# Patient Record
Sex: Male | Born: 1945
Health system: Southern US, Community
[De-identification: ages and names within clinical notes are randomized; demographics above are authoritative.]

## PROBLEM LIST (undated history)

## (undated) DIAGNOSIS — N400 Enlarged prostate without lower urinary tract symptoms: Secondary | ICD-10-CM

## (undated) DIAGNOSIS — M1990 Unspecified osteoarthritis, unspecified site: Secondary | ICD-10-CM

## (undated) DIAGNOSIS — I10 Essential (primary) hypertension: Secondary | ICD-10-CM

## (undated) DIAGNOSIS — E785 Hyperlipidemia, unspecified: Secondary | ICD-10-CM

## (undated) DIAGNOSIS — I4891 Unspecified atrial fibrillation: Secondary | ICD-10-CM

## (undated) DIAGNOSIS — M109 Gout, unspecified: Secondary | ICD-10-CM

## (undated) DIAGNOSIS — R569 Unspecified convulsions: Secondary | ICD-10-CM

## (undated) DIAGNOSIS — T7840XA Allergy, unspecified, initial encounter: Secondary | ICD-10-CM

## (undated) HISTORY — DX: Unspecified osteoarthritis, unspecified site: M19.90

## (undated) HISTORY — DX: Unspecified atrial fibrillation: I48.91

## (undated) HISTORY — DX: Gout, unspecified: M10.9

## (undated) HISTORY — DX: Allergy, unspecified, initial encounter: T78.40XA

## (undated) HISTORY — DX: Essential (primary) hypertension: I10

## (undated) HISTORY — DX: Hyperlipidemia, unspecified: E78.5

---

## 1997-05-13 ENCOUNTER — Ambulatory Visit (HOSPITAL_COMMUNITY): Admission: RE | Admit: 1997-05-13 | Discharge: 1997-05-13 | Payer: Self-pay | Admitting: Internal Medicine

## 1997-06-20 ENCOUNTER — Ambulatory Visit (HOSPITAL_COMMUNITY): Admission: RE | Admit: 1997-06-20 | Discharge: 1997-06-20 | Payer: Self-pay | Admitting: Internal Medicine

## 1997-10-15 ENCOUNTER — Ambulatory Visit (HOSPITAL_COMMUNITY): Admission: RE | Admit: 1997-10-15 | Discharge: 1997-10-15 | Payer: Self-pay | Admitting: Internal Medicine

## 1998-12-08 ENCOUNTER — Encounter: Admission: RE | Admit: 1998-12-08 | Discharge: 1998-12-08 | Payer: Self-pay | Admitting: Internal Medicine

## 1998-12-12 ENCOUNTER — Ambulatory Visit: Admission: RE | Admit: 1998-12-12 | Discharge: 1998-12-12 | Payer: Self-pay | Admitting: Internal Medicine

## 2000-04-06 ENCOUNTER — Ambulatory Visit (HOSPITAL_COMMUNITY): Admission: RE | Admit: 2000-04-06 | Discharge: 2000-04-06 | Payer: Self-pay | Admitting: Gastroenterology

## 2002-01-26 ENCOUNTER — Encounter (HOSPITAL_BASED_OUTPATIENT_CLINIC_OR_DEPARTMENT_OTHER): Payer: Self-pay | Admitting: General Surgery

## 2002-01-30 ENCOUNTER — Ambulatory Visit (HOSPITAL_COMMUNITY): Admission: RE | Admit: 2002-01-30 | Discharge: 2002-01-31 | Payer: Self-pay | Admitting: General Surgery

## 2004-12-09 ENCOUNTER — Encounter: Admission: RE | Admit: 2004-12-09 | Discharge: 2004-12-09 | Payer: Self-pay | Admitting: Internal Medicine

## 2005-05-19 ENCOUNTER — Encounter: Payer: Self-pay | Admitting: Internal Medicine

## 2005-08-17 ENCOUNTER — Ambulatory Visit (HOSPITAL_COMMUNITY): Admission: RE | Admit: 2005-08-17 | Discharge: 2005-08-17 | Payer: Self-pay | Admitting: Internal Medicine

## 2008-12-10 ENCOUNTER — Encounter: Admission: RE | Admit: 2008-12-10 | Discharge: 2008-12-10 | Payer: Self-pay | Admitting: Internal Medicine

## 2009-09-13 ENCOUNTER — Emergency Department (HOSPITAL_COMMUNITY): Admission: EM | Admit: 2009-09-13 | Discharge: 2009-09-13 | Payer: Self-pay | Admitting: Emergency Medicine

## 2009-10-21 ENCOUNTER — Emergency Department (HOSPITAL_COMMUNITY): Admission: EM | Admit: 2009-10-21 | Discharge: 2009-10-21 | Payer: Self-pay | Admitting: Emergency Medicine

## 2010-04-01 LAB — URINALYSIS, ROUTINE W REFLEX MICROSCOPIC
Bilirubin Urine: NEGATIVE
Ketones, ur: NEGATIVE mg/dL
Leukocytes, UA: NEGATIVE
Nitrite: NEGATIVE
Protein, ur: NEGATIVE mg/dL
Urobilinogen, UA: 0.2 mg/dL (ref 0.0–1.0)
pH: 5 (ref 5.0–8.0)

## 2010-04-01 LAB — URINE MICROSCOPIC-ADD ON

## 2010-04-02 LAB — RAPID URINE DRUG SCREEN, HOSP PERFORMED
Benzodiazepines: NOT DETECTED
Cocaine: NOT DETECTED
Opiates: NOT DETECTED

## 2010-04-02 LAB — URINALYSIS, ROUTINE W REFLEX MICROSCOPIC
Glucose, UA: NEGATIVE mg/dL
Hgb urine dipstick: NEGATIVE
Ketones, ur: NEGATIVE mg/dL
Leukocytes, UA: NEGATIVE
Protein, ur: 30 mg/dL — AB
pH: 5 (ref 5.0–8.0)

## 2010-04-02 LAB — DIFFERENTIAL
Eosinophils Absolute: 0.1 10*3/uL (ref 0.0–0.7)
Eosinophils Relative: 1 % (ref 0–5)
Lymphs Abs: 3.1 10*3/uL (ref 0.7–4.0)
Monocytes Relative: 8 % (ref 3–12)

## 2010-04-02 LAB — ETHANOL: Alcohol, Ethyl (B): <5 mg/dL (ref 0–10)

## 2010-04-02 LAB — CBC
Hemoglobin: 13.6 g/dL (ref 13.0–17.0)
MCH: 26.6 pg (ref 26.0–34.0)
MCV: 79.8 fL (ref 78.0–100.0)
RBC: 5.11 MIL/uL (ref 4.22–5.81)
RDW: 14.9 % (ref 11.5–15.5)

## 2010-04-02 LAB — BASIC METABOLIC PANEL
Chloride: 111 mEq/L (ref 96–112)
GFR calc non Af Amer: >60 mL/min (ref >60)
Glucose, Bld: 169 mg/dL — ABNORMAL HIGH (ref 70–99)
Sodium: 143 mEq/L (ref 135–145)

## 2010-04-02 LAB — GLUCOSE, CAPILLARY

## 2010-04-02 LAB — HEPATIC FUNCTION PANEL
ALT: 17 U/L (ref 0–53)
Alkaline Phosphatase: 58 U/L (ref 39–117)
Total Protein: 7.2 g/dL (ref 6.0–8.3)

## 2010-04-17 ENCOUNTER — Ambulatory Visit
Admission: RE | Admit: 2010-04-17 | Discharge: 2010-04-17 | Disposition: A | Discharge status: Home / Self Care | Payer: 59 | Source: Ambulatory Visit | Attending: Internal Medicine | Admitting: Internal Medicine

## 2010-04-17 ENCOUNTER — Other Ambulatory Visit: Payer: Self-pay | Admitting: Internal Medicine

## 2010-04-17 DIAGNOSIS — M542 Cervicalgia: Secondary | ICD-10-CM

## 2010-05-12 ENCOUNTER — Encounter: Payer: Self-pay | Admitting: Internal Medicine

## 2010-06-05 NOTE — Op Note (Signed)
   NAME:  Joel Duarte, Joel Duarte                            ACCOUNT NO.:  [id number]   MEDICAL RECORD NO.:  [id number]                   PATIENT TYPE:  OIB   LOCATION:  2876                                 FACILITY:  [facility]   PHYSICIAN:  Leonie Man, M.D.                DATE OF BIRTH:  1945-12-22   DATE OF PROCEDURE:  01/30/2002  DATE OF DISCHARGE:                                 OPERATIVE REPORT   PREOPERATIVE DIAGNOSIS:  Posterior midline sphincteric anal fistula.   POSTOPERATIVE DIAGNOSIS:  Posterior midline sphincteric anal fistula.   PROCEDURE:  Anal fistulotomy.   SURGEON:  Leonie Man, M.D.   ASSISTANT:  Nurse.   ANESTHESIA:  General.   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 He comes to the operating  room after the risks and benefits of surgery have been fully discussed  including possibility of incontinence, leakage, bleeding, and infection.  He  understands these risks and comes to surgery now for marsupialization of his  anal fistula.   DESCRIPTION OF PROCEDURE:  Following the induction of satisfactory general  anesthesia, the patient was placed in the lithotomy position.  I attempted a  tubular proctosigmoidoscopy, but because of a poor prep, I could not advance  the scope further than about 7 cm.  The scope was withdrawn.  The perianal  tissues were prepped and draped to be included in a sterile operative field.  I introduced the anoscope through the fistula in the posterior midline and  probed which came out in the midline at about approximately the dentate  line.  The fistula was incised down upon, across the narrow portion of the  external sphincter and opened up in its entirety. The granulation tissue in  the fistula was curetted. Hemostasis was assured with electrocautery.  I  used a 2-0 chromic catgut suture to marsupialize the base of the fistula to  the mucosa and mucocutaneous  junction in a running interlocking fashion.  Hemostasis was noted to be intact.  Needle, sponge, and instrument count  correct.  A Xeroform gauze plug was placed within the resulting wound. A  sterile dressing was applied. The anesthetic reversed and the patient  removed from the operating room to the recovery room in stable condition. He  tolerated the procedure well.                                               Leonie Man, M.D.    PB/MEDQ  D:  01/30/2002  T:  01/30/2002  Job:  [id number]

## 2010-06-05 NOTE — Procedures (Signed)
[place]  Patient:    Joel Duarte, Joel Duarte                         MRN: [id number] Proc. Date: 04/06/00 Attending:  Florencia Reasons, M.D. CC:         Lind Guest. August Saucer, M.D.   Procedure Report  PROCEDURE PERFORMED:  Colonoscopy.  ENDOSCOPIST:  Florencia Reasons, M.D.  INDICATIONS FOR PROCEDURE:  The patient is a 65 year old with history of occasional blood on the toilet paper, heme positive stool, and history of some anemia which has apparently responded to iron supplementation.  His upper endoscopy was negative.  FINDINGS:  Moderate internal hemorrhoids but otherwise normal to terminal ileum.  DESCRIPTION OF PROCEDURE:  The nature, purpose and risks of the procedure had been discussed with the patient, who provided written consent.  Sedation for this procedure and the upper endoscopy which preceeded it totaled fentanyl 50 mcg and Versed 5 mg IV without arrhythmias or desaturation.  The Olympus adult video colonoscope was advanced to the terminal ileum, applying some external abdominal compression to get the tip of the scope to enter into the terminal ileum.  Pullback was then performed.  The quality of the prep was very good although there was a little bit of stool film proximally which had to be irrigated off.  The terminal ileum had some lymphoid hyperplasia but no evidence of Crohns ileitis or other abnormalities.  The colon itself was normal.  No colitis, polyps, cancer, vascular malformations or diverticulosis were appreciated. The retroflex view into the distal rectum was unremarkable.  Pullout through the anal canal demonstrated moderate internal hemorrhoids which appeared slightly excoriated.  No biopsies were obtained.  The patient tolerated the procedure well and there were no apparent complications.  IMPRESSION:  Internal hemorrhoids which might account for the patients blood on the toilet paper but no source of anemia  endoscopically evident.  PLAN:  I would recommend monitoring the patients hemoglobin following iron supplementation and consideration of follow-up Hemoccults at some later time when the patient is not having any hemorrhoidal irritation.  Persistent problems with anemia or unexplained heme positivity might be indication for evaluation of the small bowel. DD:  04/06/00 TD:  04/06/00 Job: [id number]

## 2010-06-05 NOTE — Procedures (Signed)
. [facility]  Patient:    Joel Duarte, Joel Duarte                         MRN: [id number] Proc. Date: 04/06/00 Attending:  Florencia Reasons, M.D. CC:         Lind Guest. August Saucer, M.D.   Procedure Report  PROCEDURE:  Upper endoscopy.  INDICATION:  A 65 year old with heme-positive stool and history of iron deficiency anemia.  FINDINGS:  Minimal hiatal hernia, otherwise normal.  DESCRIPTION OF PROCEDURE:  The nature, purpose, and risks of the procedure had been discussed with the patient, who provided written consent.  Sedation was fentanyl 50 mcg and Versed 5 mg IV without arrhythmias or desaturation.  The Olympus small-caliber adult video endoscope was passed under direct vision. The larynx and vocal cords looked normal.  The esophagus was very easily entered and had normal mucosa throughout its entire length, without evidence of Barretts esophagus, reflux esophagitis, varices, infection, or neoplasia. No ring or stricture was appreciated.  There was a 2 cm hiatal hernia.  The stomach was entered.  It contained no significant residual.  The gastric mucosa was normal, without evidence of gastritis, erosions, ulcers, polyps, or masses, and the retroflex view of the proximal stomach was unrevealing except for perhaps a small hiatal hernia.  The pylorus, duodenal bulb, and second duodenum were also normal in appearance.  The scope was removed from the patient.  No biopsies were obtained.  He tolerated the procedure well.  There were no apparent complications.  IMPRESSION:  Essentially normal upper endoscopy.  Questionable small hiatal hernia.  No source of anemia evident.  PLAN:  Proceed to colonoscopic evaluation. DD:  04/06/00 TD:  04/06/00 Job: [id number]

## 2011-06-07 ENCOUNTER — Emergency Department (HOSPITAL_COMMUNITY): Payer: 59

## 2011-06-07 ENCOUNTER — Encounter (HOSPITAL_COMMUNITY): Payer: Self-pay

## 2011-06-07 ENCOUNTER — Other Ambulatory Visit: Payer: Self-pay

## 2011-06-07 ENCOUNTER — Inpatient Hospital Stay (HOSPITAL_COMMUNITY)
Admission: EM | Admit: 2011-06-07 | Discharge: 2011-06-09 | DRG: 101 | Disposition: A | Discharge status: Home / Self Care | Payer: 59 | Source: Ambulatory Visit | Attending: Internal Medicine | Admitting: Internal Medicine

## 2011-06-07 DIAGNOSIS — Z79899 Other long term (current) drug therapy: Secondary | ICD-10-CM

## 2011-06-07 DIAGNOSIS — R7309 Other abnormal glucose: Secondary | ICD-10-CM | POA: Diagnosis present

## 2011-06-07 DIAGNOSIS — R4182 Altered mental status, unspecified: Secondary | ICD-10-CM

## 2011-06-07 DIAGNOSIS — R739 Hyperglycemia, unspecified: Secondary | ICD-10-CM | POA: Diagnosis present

## 2011-06-07 DIAGNOSIS — G40909 Epilepsy, unspecified, not intractable, without status epilepticus: Principal | ICD-10-CM | POA: Diagnosis present

## 2011-06-07 DIAGNOSIS — G40409 Other generalized epilepsy and epileptic syndromes, not intractable, without status epilepticus: Secondary | ICD-10-CM | POA: Diagnosis present

## 2011-06-07 DIAGNOSIS — R569 Unspecified convulsions: Secondary | ICD-10-CM | POA: Diagnosis present

## 2011-06-07 DIAGNOSIS — G4733 Obstructive sleep apnea (adult) (pediatric): Secondary | ICD-10-CM | POA: Diagnosis present

## 2011-06-07 DIAGNOSIS — I10 Essential (primary) hypertension: Secondary | ICD-10-CM

## 2011-06-07 DIAGNOSIS — G4730 Sleep apnea, unspecified: Secondary | ICD-10-CM

## 2011-06-07 DIAGNOSIS — G40309 Generalized idiopathic epilepsy and epileptic syndromes, not intractable, without status epilepticus: Secondary | ICD-10-CM

## 2011-06-07 DIAGNOSIS — G4710 Hypersomnia, unspecified: Secondary | ICD-10-CM

## 2011-06-07 HISTORY — DX: Benign prostatic hyperplasia without lower urinary tract symptoms: N40.0

## 2011-06-07 HISTORY — DX: Unspecified convulsions: R56.9

## 2011-06-07 LAB — DIFFERENTIAL
Eosinophils Absolute: 0.1 10*3/uL (ref 0.0–0.7)
Lymphocytes Relative: 53 % — ABNORMAL HIGH (ref 12–46)
Lymphs Abs: 5.8 10*3/uL — ABNORMAL HIGH (ref 0.7–4.0)
Neutro Abs: 4.1 10*3/uL (ref 1.7–7.7)
Neutrophils Relative %: 37 % — ABNORMAL LOW (ref 43–77)

## 2011-06-07 LAB — CBC
Platelets: 158 10*3/uL (ref 150–400)
RBC: 4.41 MIL/uL (ref 4.22–5.81)
WBC: 11 10*3/uL — ABNORMAL HIGH (ref 4.0–10.5)

## 2011-06-07 LAB — COMPREHENSIVE METABOLIC PANEL
ALT: 16 U/L (ref 0–53)
Alkaline Phosphatase: 59 U/L (ref 39–117)
CO2: 16 mEq/L — ABNORMAL LOW (ref 19–32)
GFR calc Af Amer: 74 mL/min — ABNORMAL LOW (ref >90)
Glucose, Bld: 208 mg/dL — ABNORMAL HIGH (ref 70–99)
Potassium: 3.3 mEq/L — ABNORMAL LOW (ref 3.5–5.1)
Sodium: 142 mEq/L (ref 135–145)
Total Protein: 7.1 g/dL (ref 6.0–8.3)

## 2011-06-07 LAB — RAPID URINE DRUG SCREEN, HOSP PERFORMED
Amphetamines: NOT DETECTED
Barbiturates: NOT DETECTED
Benzodiazepines: NOT DETECTED
Cocaine: NOT DETECTED
Tetrahydrocannabinol: NOT DETECTED

## 2011-06-07 LAB — URINALYSIS, ROUTINE W REFLEX MICROSCOPIC
Bilirubin Urine: NEGATIVE
Specific Gravity, Urine: 1.013 (ref 1.005–1.030)
Urobilinogen, UA: 0.2 mg/dL (ref 0.0–1.0)

## 2011-06-07 MED ORDER — ACETAMINOPHEN 325 MG PO TABS: 650.0000 mg | ORAL_TABLET | Freq: Four times a day (QID) | ORAL | Status: DC | PRN

## 2011-06-07 MED ORDER — SUCCINYLCHOLINE CHLORIDE 20 MG/ML IJ SOLN
INTRAMUSCULAR | Status: AC
  Filled 2011-06-07: qty 10

## 2011-06-07 MED ORDER — METOPROLOL SUCCINATE ER 100 MG PO TB24
100.0000 mg | ORAL_TABLET | Freq: Every day | ORAL | Status: DC
  Administered 2011-06-07 – 2011-06-09 (×3): 100 mg via ORAL
  Filled 2011-06-07 (×3): qty 1

## 2011-06-07 MED ORDER — DOXAZOSIN MESYLATE 8 MG PO TABS
8.0000 mg | ORAL_TABLET | Freq: Every day | ORAL | Status: DC
  Administered 2011-06-07 – 2011-06-08 (×2): 8 mg via ORAL
  Filled 2011-06-07 (×3): qty 1

## 2011-06-07 MED ORDER — MIDAZOLAM HCL 2 MG/2ML IJ SOLN
INTRAMUSCULAR | Status: AC
  Administered 2011-06-07: 2 mg via INTRAVENOUS
  Filled 2011-06-07: qty 6

## 2011-06-07 MED ORDER — ETOMIDATE 2 MG/ML IV SOLN
INTRAVENOUS | Status: AC
  Filled 2011-06-07: qty 20

## 2011-06-07 MED ORDER — SODIUM CHLORIDE 0.9 % IV SOLN
INTRAVENOUS | Status: DC
  Administered 2011-06-07 – 2011-06-08 (×2): via INTRAVENOUS

## 2011-06-07 MED ORDER — LORAZEPAM 2 MG/ML IJ SOLN: 1.0000 mg | Freq: Four times a day (QID) | INTRAMUSCULAR | Status: DC | PRN

## 2011-06-07 MED ORDER — LORATADINE 10 MG PO TABS
10.0000 mg | ORAL_TABLET | Freq: Every day | ORAL | Status: DC
  Administered 2011-06-08: 10 mg via ORAL
  Filled 2011-06-07 (×3): qty 1

## 2011-06-07 MED ORDER — BENAZEPRIL HCL 40 MG PO TABS
40.0000 mg | ORAL_TABLET | Freq: Every day | ORAL | Status: DC
  Administered 2011-06-07 – 2011-06-09 (×3): 40 mg via ORAL
  Filled 2011-06-07 (×3): qty 1

## 2011-06-07 MED ORDER — LORAZEPAM 2 MG/ML IJ SOLN
1.0000 mg | Freq: Once | INTRAMUSCULAR | Status: AC
  Administered 2011-06-07: 1 mg via INTRAVENOUS

## 2011-06-07 MED ORDER — AMLODIPINE BESYLATE 10 MG PO TABS
10.0000 mg | ORAL_TABLET | Freq: Every day | ORAL | Status: DC
  Administered 2011-06-07 – 2011-06-09 (×3): 10 mg via ORAL
  Filled 2011-06-07 (×3): qty 1

## 2011-06-07 MED ORDER — ACETAMINOPHEN 650 MG RE SUPP: 650.0000 mg | Freq: Four times a day (QID) | RECTAL | Status: DC | PRN

## 2011-06-07 MED ORDER — LIDOCAINE HCL (CARDIAC) 20 MG/ML IV SOLN
INTRAVENOUS | Status: AC
  Filled 2011-06-07: qty 5

## 2011-06-07 MED ORDER — MIDAZOLAM HCL 2 MG/2ML IJ SOLN
5.0000 mg | INTRAMUSCULAR | Status: DC | PRN
  Administered 2011-06-07: 2 mg via INTRAVENOUS

## 2011-06-07 MED ORDER — LORAZEPAM 2 MG/ML IJ SOLN
2.0000 mg | Freq: Once | INTRAMUSCULAR | Status: AC
  Administered 2011-06-07: 2 mg via INTRAVENOUS

## 2011-06-07 MED ORDER — NALOXONE HCL 1 MG/ML IJ SOLN
INTRAMUSCULAR | Status: AC
  Administered 2011-06-07: 09:00:00
  Filled 2011-06-07: qty 2

## 2011-06-07 MED ORDER — SODIUM CHLORIDE 0.9 % IV SOLN
Freq: Once | INTRAVENOUS | Status: AC
  Administered 2011-06-07: 07:00:00 via INTRAVENOUS

## 2011-06-07 MED ORDER — LORAZEPAM 2 MG/ML IJ SOLN
INTRAMUSCULAR | Status: AC
  Administered 2011-06-07: 2 mg via INTRAVENOUS
  Administered 2011-06-07: 1 mg via INTRAVENOUS
  Filled 2011-06-07: qty 2

## 2011-06-07 MED ORDER — SODIUM CHLORIDE 0.9 % IV SOLN
1000.0000 mg | INTRAVENOUS | Status: AC
  Administered 2011-06-07: 1000 mg via INTRAVENOUS
  Filled 2011-06-07: qty 10

## 2011-06-07 MED ORDER — ROCURONIUM BROMIDE 50 MG/5ML IV SOLN
INTRAVENOUS | Status: AC
  Filled 2011-06-07: qty 2

## 2011-06-07 NOTE — ED Notes (Signed)
CBG completed 174

## 2011-06-07 NOTE — H&P (Signed)
Hospital Admission Note Date: 06/07/2011  Patient name: Joel Duarte           Medical record number: [id number] Date of birth: 1945-07-07           Age: 66 y.o.   Gender: male    PCP:   DEAN, ERIC, MD, MD   Chief Complaint:  Tonic-clonic seizures  HPI: Joel Duarte is a 66 y.o. male specimen was of hypertension, OSA on CPAP. Patient brought to the hospital by EMS after his wife called 911 after he had tonic-clonic seizure at home. Patient was having no complaints when he went to the bed last night, this morning his wife mentioned that he had generalized body shakes and foam coming out of his mouth this morning so she took the CPAP mask of his face and he was still seizing. She called 911 and brought him to the hospital. In the hospital he did receive 4 mg of Ativan, and I do not know if he got any benzodiazepines during EMT transportation to the hospital. On interview the patient he was still lethargic, likely from the benzodiazepines. But he is oriented. Patient was also given Keppra in the ED. Patient will be admitted for further evaluation.   Past Medical History: Past Medical History  Diagnosis Date  . Hypertension   . Allergy   . Osteoarthrosis, unspecified whether generalized or localized, unspecified site   . Seizures     history of 1 a year ago  . Enlarged prostate    History reviewed. No pertinent past surgical history.  Medications: Prior to Admission medications   Medication Sig Start Date End Date Taking? Authorizing Provider  amLODipine (NORVASC) 10 MG tablet Take 10 mg by mouth daily.   Yes Historical Provider, MD  benazepril (LOTENSIN) 40 MG tablet Take 40 mg by mouth daily.   Yes Historical Provider, MD  Cholecalciferol (VITAMIN D PO) Take 2 capsules by mouth daily.   Yes Historical Provider, MD  doxazosin (CARDURA) 8 MG tablet Take 8 mg by mouth at bedtime.   Yes Historical Provider, MD  fexofenadine (ALLEGRA) 60 MG tablet Take 60 mg by mouth daily as needed. For  allergies.   Yes Historical Provider, MD  metoprolol succinate (TOPROL-XL) 100 MG 24 hr tablet Take 100 mg by mouth daily. Take with or immediately following a meal.   Yes Historical Provider, MD  naproxen (NAPROSYN) 500 MG tablet Take 500 mg by mouth daily as needed. For inflammation.   Yes Historical Provider, MD  tadalafil (CIALIS) 5 MG tablet Take 5 mg by mouth daily as needed. For prostate.   Yes Historical Provider, MD    Allergies:  No Known Allergies  Social History:  does not have a smoking history on file. He does not have any smokeless tobacco history on file. His alcohol and drug histories not on file.  Family History: No family history on file.  Review of Systems:  Constitutional: negative for anorexia, fevers and sweats Eyes: negative for irritation, redness and visual disturbance Ears, nose, mouth, throat, and face: negative for earaches, epistaxis, nasal congestion and sore throat Respiratory: negative for cough, dyspnea on exertion, sputum and wheezing Cardiovascular: negative for chest pain, dyspnea, lower extremity edema, orthopnea, palpitations and syncope Gastrointestinal: negative for abdominal pain, constipation, diarrhea, melena, nausea and vomiting Genitourinary:negative for dysuria, frequency and hematuria Hematologic/lymphatic: negative for bleeding, easy bruising and lymphadenopathy Musculoskeletal:negative for arthralgias, muscle weakness and stiff joints Neurological: negative for coordination problems, gait problems, headaches and weakness Endocrine: negative  for diabetic symptoms including polydipsia, polyuria and weight loss Allergic/Immunologic: negative for anaphylaxis, hay fever and urticaria  Physical Exam: BP 179/69  Pulse 126  Temp(Src) 98.5 F (36.9 C) (Oral)  Resp 24  SpO2 93% General appearance: alert, cooperative and no distress  Head: Normocephalic, without obvious abnormality, atraumatic  Eyes: conjunctivae/corneas clear. PERRL, EOM's  intact. Fundi benign.  Nose: Nares normal. Septum midline. Mucosa normal. No drainage or sinus tenderness.  Throat: lips, mucosa, and tongue normal; teeth and gums normal  Neck: Supple, no masses, no cervical lymphadenopathy, no JVD appreciated, no meningeal signs Resp: clear to auscultation bilaterally  Chest wall: no tenderness  Cardio: regular rate and rhythm, S1, S2 normal, no murmur, click, rub or gallop  GI: soft, non-tender; bowel sounds normal; no masses, no organomegaly  Extremities: extremities normal, atraumatic, no cyanosis or edema  Skin: Skin color, texture, turgor normal. No rashes or lesions  Neurologic: Alert and oriented X 3, normal strength and tone. Normal symmetric reflexes. Normal coordination and gait  Labs on Admission:   [facility] 06/07/11 0740  NA 142  K 3.3*  CL 104  CO2 16*  GLUCOSE 208*  BUN 18  CREATININE 1.18  CALCIUM 8.6  MG --  PHOS --    Basename 06/07/11 0740  AST 23  ALT 16  ALKPHOS 59  BILITOT 0.4  PROT 7.1  ALBUMIN 3.6   No results found for this basename: LIPASE:2,AMYLASE:2 in the last 72 hours  Basename 06/07/11 0740  WBC 11.0*  NEUTROABS 4.1  HGB 11.6*  HCT 35.6*  MCV 80.7  PLT 158   No results found for this basename: CKTOTAL:3,CKMB:3,CKMBINDEX:3,TROPONINI:3 in the last 72 hours No results found for this basename: TSH,T4TOTAL,FREET3,T3FREE,THYROIDAB in the last 72 hours No results found for this basename: VITAMINB12:2,FOLATE:2,FERRITIN:2,TIBC:2,IRON:2,RETICCTPCT:2 in the last 72 hours  Radiological Exams on Admission: Ct Head Wo Contrast  06/07/2011  *RADIOLOGY REPORT*  Clinical Data: Seizure.  Confusion.  CT HEAD WITHOUT CONTRAST  Technique:  Contiguous axial images were obtained from the base of the skull through the vertex without contrast.  Comparison: 09/13/2009  Findings: The brain has a normal appearance without evidence of malformation, atrophy, old or acute infarction, mass lesion, hemorrhage, hydrocephalus or  extra-axial collection. Atherosclerotic calcification effects major vessels at the base of the brain.  The calvarium is unremarkable.  Sinuses, middle ears and mastoids are clear.  IMPRESSION: No acute or focal finding.  Normal appearance of the brain. Atherosclerosis.  Original Report Authenticated By: Thomasenia Sales, M.D.     IMPRESSION: Present on Admission:  .Seizure .HTN (hypertension) .Tonic clonic convulsion .OSA (obstructive sleep apnea)  Assessment/Plan  Seizure Patient had witnessed tonic-clonic seizures this morning, after Ativan administration the seizures resolved. This is the second episode of seizure patient had. He had an episode of tonic-clonic seizure also in 2011 and at that time patient was sent home from the emergency department. Patient does have normal CT scan of the head, denies any recent head trauma, CVA or any new medications. His urine drug screen is negative. I will call neurology to evaluate and recommend medications if needed.  Hypertension Patient is on metoprolol, Cardura and amlodipine. We'll continue these medications and continued to followup.  Obstructive sleep apnea The patient on CPAP at home and that'll be continued.  Hyperglycemia It's been noticed that the patient blood sugar from the BNP is over 200, I'll check hemoglobin A1c.  Viyan Rosamond A 06/07/2011, 10:20 AM

## 2011-06-07 NOTE — ED Notes (Signed)
Per EMS, pt at home with witnessed seizure by wife.Pt has a hx of seizure 1 year ago. Pupils on arrival were pinpoint. 2 MG Narcan given. 18g to Right hand. Pt is currently awake and combative. No words spoken at this time. Right pupil now larger than left pupil

## 2011-06-07 NOTE — ED Notes (Signed)
Family at bedside. 

## 2011-06-07 NOTE — Progress Notes (Signed)
This visit was in response to a page from ED.  The pt was brought in possibly having seizures and is being combative with staff.  Pt's wife is in the conference room.  Pt's wife is calm.  I offered emotional support.  I acted as a Print production planner between family and Haematologist. Pt is being taken to CT.   I will ask the ED chaplain to check in with the family. Joel Duarte  [phone]  oncall pager

## 2011-06-07 NOTE — ED Provider Notes (Addendum)
History     CSN: [id number]  Arrival date & time 06/07/11  0721   First MD Initiated Contact with Patient 06/07/11 [phone]      Chief Complaint  Patient presents with  . Seizures    (Consider location/radiation/quality/duration/timing/severity/associated sxs/prior treatment) Patient is a 66 y.o. male presenting with seizures. The history is provided by the EMS personnel. The history is limited by the condition of the patient (post-ictal).  Seizures   He was brought in by EMS after having had a seizure at home which was witnessed by his wife. He was combative and poorly responsive in the ambulance. He was noted to have bitten his tongue but did not have any incontinence. He is reported to have had a history of a seizure in the past. He medications which were brought with the patient did not include any anticonvulsants.  Past Medical History  Diagnosis Date  . Hypertension   . Allergy   . Osteoarthrosis, unspecified whether generalized or localized, unspecified site   . Seizures     history of 1 a year ago  . Enlarged prostate     History reviewed. No pertinent past surgical history.  No family history on file.  History  Substance Use Topics  . Smoking status: Not on file  . Smokeless tobacco: Not on file  . Alcohol Use:       Review of Systems  Unable to perform ROS: Mental status change  Neurological: Positive for seizures.    Allergies  Review of patient's allergies indicates no known allergies.  Home Medications   Current Outpatient Rx  Name Route Sig Dispense Refill  . LOTREL PO Oral Take by mouth daily. Lotrel 10/40 mg one daily     . ASPIRIN 81 MG PO TABS Oral Take 81 mg by mouth daily.      . AZELASTINE HCL 137 MCG/SPRAY NA SOLN Nasal 1 spray by Nasal route as needed. Use in each nostril as directed     . DOXAZOSIN MESYLATE 8 MG PO TABS Oral Take 8 mg by mouth. Doxazosin 8 mg q.h.s.     . DUTASTERIDE 0.5 MG PO CAPS Oral Take 0.5 mg by mouth daily.      Marland Kitchen  FEXOFENADINE HCL 60 MG PO TABS Oral Take 60 mg by mouth as needed.      Marland Kitchen NAPROXEN PO Oral Take 500 mg by mouth daily.      Marland Kitchen SILDENAFIL CITRATE 100 MG PO TABS Oral Take 100 mg by mouth as needed.      Marland Kitchen SILODOSIN 8 MG PO CAPS Oral Take by mouth daily.      . TRIAMTERENE-HCTZ PO Oral Take 12.5 mg by mouth daily.        Pulse 126  Resp 24  SpO2 93%  Physical Exam  Nursing note and vitals reviewed.  66 year old male who is agitated and does not respond to verbal stimuli. Vital signs are significant for tachycardia with a rate of 126 and also tachypnea with respiratory rate of 24 and hypertension with blood pressure 170/100. Head is normocephalic and atraumatic. He does not cooperate well for to evaluate pupils and not every time I try and open his eyes he thrashes his head from side to side and I cannot do a good look at his pupils. There is a bite on his tongue in the midline near the tip. Neck is nontender. Back is nontender. Lungs are clear without rales, wheezes, or rhonchi. Heart has regular rate and rhythm without  murmur. Abdomen is soft and nontender without masses or hepatosplenomegaly. Extremities show no evidence of trauma full range of motion is present. Skin is warm and dry without rash. Neurologic: He is agitated and thrashing around in the bed in a pattern that is very suggestive of post ictal state. He does not respond to verbal stimuli but will respond by just general agitation to being touched in certain areas. There no gross cranial nerve deficits and he moves all extremities equally.  ED Course  Procedures (including critical care time)  Results for orders placed during the hospital encounter of 06/07/11  CBC      Component Value Range   WBC 11.0 (*) 4.0 - 10.5 (K/uL)   RBC 4.41  4.22 - 5.81 (MIL/uL)   Hemoglobin 11.6 (*) 13.0 - 17.0 (g/dL)   HCT 45.4 (*) 09.8 - 52.0 (%)   MCV 80.7  78.0 - 100.0 (fL)   MCH 26.3  26.0 - 34.0 (pg)   MCHC 32.6  30.0 - 36.0 (g/dL)   RDW 11.9   14.7 - 82.9 (%)   Platelets 158  150 - 400 (K/uL)  DIFFERENTIAL      Component Value Range   Neutrophils Relative 37 (*) 43 - 77 (%)   Neutro Abs 4.1  1.7 - 7.7 (K/uL)   Lymphocytes Relative 53 (*) 12 - 46 (%)   Lymphs Abs 5.8 (*) 0.7 - 4.0 (K/uL)   Monocytes Relative 9  3 - 12 (%)   Monocytes Absolute 1.0  0.1 - 1.0 (K/uL)   Eosinophils Relative 1  0 - 5 (%)   Eosinophils Absolute 0.1  0.0 - 0.7 (K/uL)   Basophils Relative 0  0 - 1 (%)   Basophils Absolute 0.0  0.0 - 0.1 (K/uL)  COMPREHENSIVE METABOLIC PANEL      Component Value Range   Sodium 142  135 - 145 (mEq/L)   Potassium 3.3 (*) 3.5 - 5.1 (mEq/L)   Chloride 104  96 - 112 (mEq/L)   CO2 16 (*) 19 - 32 (mEq/L)   Glucose, Bld 208 (*) 70 - 99 (mg/dL)   BUN 18  6 - 23 (mg/dL)   Creatinine, Ser 5.62  0.50 - 1.35 (mg/dL)   Calcium 8.6  8.4 - 13.0 (mg/dL)   Total Protein 7.1  6.0 - 8.3 (g/dL)   Albumin 3.6  3.5 - 5.2 (g/dL)   AST 23  0 - 37 (U/L)   ALT 16  0 - 53 (U/L)   Alkaline Phosphatase 59  39 - 117 (U/L)   Total Bilirubin 0.4  0.3 - 1.2 (mg/dL)   GFR calc non Af Amer 63 (*) >90 (mL/min)   GFR calc Af Amer 74 (*) >90 (mL/min)  ETHANOL      Component Value Range   Alcohol, Ethyl (B) <11  0 - 11 (mg/dL)  GLUCOSE, CAPILLARY      Component Value Range   Glucose-Capillary 174 (*) 70 - 99 (mg/dL)   Comment 1 Documented in Chart     Comment 2 Notify RN     Ct Head Wo Contrast  06/07/2011  *RADIOLOGY REPORT*  Clinical Data: Seizure.  Confusion.  CT HEAD WITHOUT CONTRAST  Technique:  Contiguous axial images were obtained from the base of the skull through the vertex without contrast.  Comparison: 09/13/2009  Findings: The brain has a normal appearance without evidence of malformation, atrophy, old or acute infarction, mass lesion, hemorrhage, hydrocephalus or extra-axial collection. Atherosclerotic calcification effects major vessels at  the base of the brain.  The calvarium is unremarkable.  Sinuses, middle ears and mastoids are  clear.  IMPRESSION: No acute or focal finding.  Normal appearance of the brain. Atherosclerosis.  Original Report Authenticated By: Thomasenia Sales, M.D.    Date: 06/07/2011  Rate: 83  Rhythm: normal sinus rhythm  QRS Axis: normal  Intervals: normal  ST/T Wave abnormalities: nonspecific T wave changes  Conduction Disutrbances:nonspecific intraventricular conduction delay  Narrative Interpretation: Nonspecific T wave flattening, nonspecific anterior ventricular conduction delay. When compared with ECG of 09/13/2009, improvement is seen in repolarization abnormality  Old EKG Reviewed: changes noted    1. Seizure   2. Altered mental state   3. Hyperglycemia       MDM  Apparent seizure with prolonged postictal state. His old records have been reviewed and he has a prior ED visit for her seizure in 2011. He is given Ativan to try and reduce his agitation level and seizure workup will be repeated since it has been several years since his last seizure and initial dose of IV Is given.  He required an additional 2 mg of Ativan but has not woken up. Agitation has decreased to the point that he could get a CT scan done which did not show any acute process. Loading dose of Keppra has been ordered. At this point, it is not clear whether he is having ongoing occult seizure activity with status epilepticus or just a prolonged postictal state. In either case, he will need to be admitted. Case is discussed with Dr. Arthor Captain of [facility] who agrees to admit the patient. Of note, metabolic acidosis is consistent with recent seizure activity.  CRITICAL CARE Performed by: Dione Booze   Total critical care time: 60 minutes  Critical care time was exclusive of separately billable procedures and treating other patients.  Critical care was necessary to treat or prevent imminent or life-threatening deterioration.  Critical care was time spent personally by me on the following activities: development  of treatment plan with patient and/or surrogate as well as nursing, discussions with consultants, evaluation of patient's response to treatment, examination of patient, obtaining history from patient or surrogate, ordering and performing treatments and interventions, ordering and review of laboratory studies, ordering and review of radiographic studies, pulse oximetry and re-evaluation of patient's condition.         Dione Booze, MD 06/07/11 1610  Dione Booze, MD 06/07/11 [phone]

## 2011-06-07 NOTE — Consult Note (Signed)
[facility] CONSULT NOTE     Reason for Consult: Second seizure of lifetime.  CC: Seizure.    HPI:    Joel Duarte is an 66 y.o. male with a history of HTN and OSA (on CPAP). He presented to the ED for assessment of the second seizure of his lifetime. The patient was most likely asleep at seizure onset, as his wife was awakened by his thrashing movements in bed today. The patient was wearing his CPAP at the time, which wife removed during the seizure. She describes the movements as alternating high-amplitude flailing of his arms with extended, stiff legs, neck hyperextended and eyes open with globes rolled upward without jerking of the eyes. The patient bit his tongue and was drooling/foaming at the mouth. The movements continued for 5-6 minutes followed by a sleep-like state with eyes open and sonorous breathing. The patient's mentation has gradually improved since then, both during transport and in the ED. In the ED the patient received 4 mg of Ativan to control agitation - patient was not seizing at that time. Patient given Keppra x 1 in the ED. Patient not on any seizure meds at home. Recently started taking Cialis about 10 days ago, otherwise no new medication changes.   He "saw a neurologist 2 years ago" and may have had an MRI at that time. His wife states that an EEG done previously was "normal".    Past Medical History  Diagnosis Date  . Hypertension   . Allergy   . Osteoarthrosis, unspecified whether generalized or localized, unspecified site   . Seizures     history of 1 a year ago  . Enlarged prostate     History reviewed. No pertinent past surgical history.  No family history on file.  Social History:  does not have a smoking history on file. He does not have any smokeless tobacco history on file. His alcohol and drug histories not on file.  No Known Allergies  Medications:    Prior to Admission:  (Not in a hospital  admission)  Review of Systems - No fever, chills, diarrhea, nausea, vomiting, abdominal pain, cough, rash or headache.   Blood pressure 147/110, pulse 65, temperature 98.5 F (36.9 C), temperature source Oral, resp. rate 22, SpO2 97.00%.   Neurologic Examination:   Mental Status: Drowsy, not oriented to year or day. Oriented to city. Repetition and naming intact. Speech slow but fluent with comprehension intact.  Cranial Nerves: II-Visual fields grossly intact to confrontation. III/IV/VI- Saccadic smooth pursuits. Pupils reactive bilaterally. VII- No facial droop.  VIII-grossly intact to conversation X- phonation intact XI- bilateral shoulder shrug intact XII-midline tongue extension Motor: 5/5 bilaterally with normal tone and bulk Sensory: Temperature and light touch intact bilaterally with no extinction.  Deep Tendon Reflexes: 3+ patellae bilaterally. Achilles 2+ bilaterally. Biceps and brachioradialis 2+ bilaterally.  Cerebellar: Normal finger-to-nose, except for action tremor.  Gait: Deferred.   General: /AT, nondiaphoretic, noncyanotic. HEENT: Positive for tongue bites.    No results found for this basename: cbc, bmp, coags, chol, tri, ldl, hga1c    Results for orders placed during the hospital encounter of 06/07/11 (from the past 48 hour(s))  CBC     Status: Abnormal   Collection Time   06/07/11  7:40 AM      Component Value Range Comment   WBC 11.0 (*) 4.0 - 10.5 (K/uL)  RBC 4.41  4.22 - 5.81 (MIL/uL)    Hemoglobin 11.6 (*) 13.0 - 17.0 (g/dL)    HCT 16.1 (*) 09.6 - 52.0 (%)    MCV 80.7  78.0 - 100.0 (fL)    MCH 26.3  26.0 - 34.0 (pg)    MCHC 32.6  30.0 - 36.0 (g/dL)    RDW 04.5  40.9 - 81.1 (%)    Platelets 158  150 - 400 (K/uL)   DIFFERENTIAL     Status: Abnormal   Collection Time   06/07/11  7:40 AM      Component Value Range Comment   Neutrophils Relative 37 (*) 43 - 77 (%)    Neutro Abs 4.1  1.7 - 7.7 (K/uL)    Lymphocytes Relative 53 (*) 12 - 46 (%)     Lymphs Abs 5.8 (*) 0.7 - 4.0 (K/uL)    Monocytes Relative 9  3 - 12 (%)    Monocytes Absolute 1.0  0.1 - 1.0 (K/uL)    Eosinophils Relative 1  0 - 5 (%)    Eosinophils Absolute 0.1  0.0 - 0.7 (K/uL)    Basophils Relative 0  0 - 1 (%)    Basophils Absolute 0.0  0.0 - 0.1 (K/uL)   COMPREHENSIVE METABOLIC PANEL     Status: Abnormal   Collection Time   06/07/11  7:40 AM      Component Value Range Comment   Sodium 142  135 - 145 (mEq/L)    Potassium 3.3 (*) 3.5 - 5.1 (mEq/L)    Chloride 104  96 - 112 (mEq/L)    CO2 16 (*) 19 - 32 (mEq/L)    Glucose, Bld 208 (*) 70 - 99 (mg/dL)    BUN 18  6 - 23 (mg/dL)    Creatinine, Ser 9.14  0.50 - 1.35 (mg/dL)    Calcium 8.6  8.4 - 10.5 (mg/dL)    Total Protein 7.1  6.0 - 8.3 (g/dL)    Albumin 3.6  3.5 - 5.2 (g/dL)    AST 23  0 - 37 (U/L) HEMOLYSIS AT THIS LEVEL MAY AFFECT RESULT   ALT 16  0 - 53 (U/L)    Alkaline Phosphatase 59  39 - 117 (U/L)    Total Bilirubin 0.4  0.3 - 1.2 (mg/dL)    GFR calc non Af Amer 63 (*) >90 (mL/min)    GFR calc Af Amer 74 (*) >90 (mL/min)   ETHANOL     Status: Normal   Collection Time   06/07/11  7:40 AM      Component Value Range Comment   Alcohol, Ethyl (B) <11  0 - 11 (mg/dL)   GLUCOSE, CAPILLARY     Status: Abnormal   Collection Time   06/07/11  8:18 AM      Component Value Range Comment   Glucose-Capillary 174 (*) 70 - 99 (mg/dL)    Comment 1 Documented in Chart      Comment 2 Notify RN     URINALYSIS, ROUTINE W REFLEX MICROSCOPIC     Status: Abnormal   Collection Time   06/07/11  8:56 AM      Component Value Range Comment   Color, Urine YELLOW  YELLOW     APPearance CLEAR  CLEAR     Specific Gravity, Urine 1.013  1.005 - 1.030     pH 5.0  5.0 - 8.0     Glucose, UA 100 (*) NEGATIVE (mg/dL)    Hgb urine dipstick TRACE (*)  NEGATIVE     Bilirubin Urine NEGATIVE  NEGATIVE     Ketones, ur NEGATIVE  NEGATIVE (mg/dL)    Protein, ur 30 (*) NEGATIVE (mg/dL)    Urobilinogen, UA 0.2  0.0 - 1.0 (mg/dL)    Nitrite  NEGATIVE  NEGATIVE     Leukocytes, UA NEGATIVE  NEGATIVE    URINE RAPID DRUG SCREEN (HOSP PERFORMED)     Status: Normal   Collection Time   06/07/11  8:56 AM      Component Value Range Comment   Opiates NONE DETECTED  NONE DETECTED     Cocaine NONE DETECTED  NONE DETECTED     Benzodiazepines NONE DETECTED  NONE DETECTED     Amphetamines NONE DETECTED  NONE DETECTED     Tetrahydrocannabinol NONE DETECTED  NONE DETECTED     Barbiturates NONE DETECTED  NONE DETECTED    URINE MICROSCOPIC-ADD ON     Status: Normal   Collection Time   06/07/11  8:56 AM      Component Value Range Comment   WBC, UA 0-2  <3 (WBC/hpf)    RBC / HPF 0-2  <3 (RBC/hpf)    Bacteria, UA RARE  RARE      Ct Head Wo Contrast  06/07/2011  *RADIOLOGY REPORT*  Clinical Data: Seizure.  Confusion.  CT HEAD WITHOUT CONTRAST  Technique:  Contiguous axial images were obtained from the base of the skull through the vertex without contrast.  Comparison: 09/13/2009  Findings: The brain has a normal appearance without evidence of malformation, atrophy, old or acute infarction, mass lesion, hemorrhage, hydrocephalus or extra-axial collection. Atherosclerotic calcification effects major vessels at the base of the brain.  The calvarium is unremarkable.  Sinuses, middle ears and mastoids are clear.  IMPRESSION: No acute or focal finding.  Normal appearance of the brain. Atherosclerosis.  Original Report Authenticated By: Thomasenia Sales, M.D.     Assessment/Plan:   Second unprovoked seizure of lifetime. Initiation of an anticonvulsant medication is indicated. Has been given Keppra x 1. Would use Depakote as an outpatient. Monitor platelets, ammonia, LFTs as outpatient while on Depakote and also obtain during this admission. Depakote ER should be started at 10 mg/kg per day, taken once per day. Counseled not to drive until seizure free for 6-12 months. Also not to swim, not to take baths, not to operate heavy machinery, work at heights or  perform any other activities that would put patient or others at risk should he have a seizure. Obtain EEG. Obtain MRI brain with and without contrast. Obtain Mg level.   Electronically signed: Dr. Caryl Pina   06/07/2011, 11:00 AM

## 2011-06-07 NOTE — ED Notes (Signed)
Neurologist at bedside. 

## 2011-06-07 NOTE — ED Notes (Signed)
Attempted to call report to [phone] but nurse will call me back

## 2011-06-07 NOTE — ED Notes (Signed)
Hospitalist at bedside 

## 2011-06-07 NOTE — ED Notes (Signed)
MD back at bedside to discuss plan of care with family.

## 2011-06-08 ENCOUNTER — Inpatient Hospital Stay (HOSPITAL_COMMUNITY): Payer: 59

## 2011-06-08 DIAGNOSIS — R569 Unspecified convulsions: Secondary | ICD-10-CM

## 2011-06-08 LAB — COMPREHENSIVE METABOLIC PANEL
Albumin: 3.1 g/dL — ABNORMAL LOW (ref 3.5–5.2)
BUN: 15 mg/dL (ref 6–23)
Calcium: 8.5 mg/dL (ref 8.4–10.5)
GFR calc Af Amer: 76 mL/min — ABNORMAL LOW (ref >90)
Glucose, Bld: 109 mg/dL — ABNORMAL HIGH (ref 70–99)
Total Protein: 6.1 g/dL (ref 6.0–8.3)

## 2011-06-08 LAB — CBC
HCT: 32.9 % — ABNORMAL LOW (ref 39.0–52.0)
Hemoglobin: 10.8 g/dL — ABNORMAL LOW (ref 13.0–17.0)
MCV: 79.7 fL (ref 78.0–100.0)
RBC: 4.13 MIL/uL — ABNORMAL LOW (ref 4.22–5.81)
WBC: 7.7 10*3/uL (ref 4.0–10.5)

## 2011-06-08 LAB — HEMOGLOBIN A1C
Hgb A1c MFr Bld: 6.3 % — ABNORMAL HIGH (ref <5.7)
Mean Plasma Glucose: 134 mg/dL — ABNORMAL HIGH (ref <117)

## 2011-06-08 MED ORDER — GADOBENATE DIMEGLUMINE 529 MG/ML IV SOLN
20.0000 mL | Freq: Once | INTRAVENOUS | Status: AC
  Administered 2011-06-08: 20 mL via INTRAVENOUS

## 2011-06-08 NOTE — Progress Notes (Signed)
Routine EEG completed.  

## 2011-06-08 NOTE — Progress Notes (Signed)
Subjective:  History as noted. Patient has little recall of events around his seizure. He had however recently restarted using Cialis which he was taking in the morning. He had been using doxazosin at bedtime. Patient had done this for approximately 10 days without event until his present presentation. He had a similar event 2 years ago which was possibly associated with the use of a similar medication. He has not had any problems up until the past week.   No Known Allergies Current Facility-Administered Medications  Medication Dose Route Frequency Provider Last Rate Last Dose  . 0.9 %  sodium chloride infusion   Intravenous Continuous Clydia Llano, MD 50 mL/hr at 06/08/11 2026    . acetaminophen (TYLENOL) tablet 650 mg  650 mg Oral Q6H PRN Clydia Llano, MD       Or  . acetaminophen (TYLENOL) suppository 650 mg  650 mg Rectal Q6H PRN Clydia Llano, MD      . amLODipine (NORVASC) tablet 10 mg  10 mg Oral Daily Clydia Llano, MD   10 mg at 06/08/11 1035  . benazepril (LOTENSIN) tablet 40 mg  40 mg Oral Daily Clydia Llano, MD   40 mg at 06/08/11 1035  . doxazosin (CARDURA) tablet 8 mg  8 mg Oral QHS Clydia Llano, MD   8 mg at 06/08/11 2122  . gadobenate dimeglumine (MULTIHANCE) injection 20 mL  20 mL Intravenous Once Medication Radiologist, MD   20 mL at 06/08/11 1526  . loratadine (CLARITIN) tablet 10 mg  10 mg Oral Daily Clydia Llano, MD   10 mg at 06/08/11 1035  . LORazepam (ATIVAN) injection 1 mg  1 mg Intravenous Q6H PRN Caroline More, NP      . metoprolol succinate (TOPROL-XL) 24 hr tablet 100 mg  100 mg Oral Daily Clydia Llano, MD   100 mg at 06/08/11 1035    Objective: Blood pressure 152/84, pulse 65, temperature 97.6 F (36.4 C), temperature source Oral, resp. rate 16, height 6' 2.02" (1.88 m), weight 225 lb (102.059 kg), SpO2 98.00%.  Well-developed well-nourished black male in no acute distress. HEENT: Extraocular muscles are intact without diplopia. No sinus tenderness. NECK: No  enlarged thyroid. No posterior cervical nodes. No carotid bruits. LUNGS: Clear to auscultation. No vocal fremitus. CV: Normal S1, S2 without S3. No ectopy. ABD: Soft nontender. MSK: Negative Homans. No edema. NEURO: Alert, oriented x3 cranial nerves were intact. Negative drift.  CT scan results noted and reviewed.  Lab results: Results for orders placed during the hospital encounter of 06/07/11 (from the past 48 hour(s))  CBC     Status: Abnormal   Collection Time   06/07/11  7:40 AM      Component Value Range Comment   WBC 11.0 (*) 4.0 - 10.5 (K/uL)    RBC 4.41  4.22 - 5.81 (MIL/uL)    Hemoglobin 11.6 (*) 13.0 - 17.0 (g/dL)    HCT 96.0 (*) 45.4 - 52.0 (%)    MCV 80.7  78.0 - 100.0 (fL)    MCH 26.3  26.0 - 34.0 (pg)    MCHC 32.6  30.0 - 36.0 (g/dL)    RDW 09.8  11.9 - 14.7 (%)    Platelets 158  150 - 400 (K/uL)   DIFFERENTIAL     Status: Abnormal   Collection Time   06/07/11  7:40 AM      Component Value Range Comment   Neutrophils Relative 37 (*) 43 - 77 (%)    Neutro Abs 4.1  1.7 - 7.7 (K/uL)    Lymphocytes Relative 53 (*) 12 - 46 (%)    Lymphs Abs 5.8 (*) 0.7 - 4.0 (K/uL)    Monocytes Relative 9  3 - 12 (%)    Monocytes Absolute 1.0  0.1 - 1.0 (K/uL)    Eosinophils Relative 1  0 - 5 (%)    Eosinophils Absolute 0.1  0.0 - 0.7 (K/uL)    Basophils Relative 0  0 - 1 (%)    Basophils Absolute 0.0  0.0 - 0.1 (K/uL)   COMPREHENSIVE METABOLIC PANEL     Status: Abnormal   Collection Time   06/07/11  7:40 AM      Component Value Range Comment   Sodium 142  135 - 145 (mEq/L)    Potassium 3.3 (*) 3.5 - 5.1 (mEq/L)    Chloride 104  96 - 112 (mEq/L)    CO2 16 (*) 19 - 32 (mEq/L)    Glucose, Bld 208 (*) 70 - 99 (mg/dL)    BUN 18  6 - 23 (mg/dL)    Creatinine, Ser 4.54  0.50 - 1.35 (mg/dL)    Calcium 8.6  8.4 - 10.5 (mg/dL)    Total Protein 7.1  6.0 - 8.3 (g/dL)    Albumin 3.6  3.5 - 5.2 (g/dL)    AST 23  0 - 37 (U/L) HEMOLYSIS AT THIS LEVEL MAY AFFECT RESULT   ALT 16  0 - 53  (U/L)    Alkaline Phosphatase 59  39 - 117 (U/L)    Total Bilirubin 0.4  0.3 - 1.2 (mg/dL)    GFR calc non Af Amer 63 (*) >90 (mL/min)    GFR calc Af Amer 74 (*) >90 (mL/min)   ETHANOL     Status: Normal   Collection Time   06/07/11  7:40 AM      Component Value Range Comment   Alcohol, Ethyl (B) <11  0 - 11 (mg/dL)   GLUCOSE, CAPILLARY     Status: Abnormal   Collection Time   06/07/11  8:18 AM      Component Value Range Comment   Glucose-Capillary 174 (*) 70 - 99 (mg/dL)    Comment 1 Documented in Chart      Comment 2 Notify RN     URINALYSIS, ROUTINE W REFLEX MICROSCOPIC     Status: Abnormal   Collection Time   06/07/11  8:56 AM      Component Value Range Comment   Color, Urine YELLOW  YELLOW     APPearance CLEAR  CLEAR     Specific Gravity, Urine 1.013  1.005 - 1.030     pH 5.0  5.0 - 8.0     Glucose, UA 100 (*) NEGATIVE (mg/dL)    Hgb urine dipstick TRACE (*) NEGATIVE     Bilirubin Urine NEGATIVE  NEGATIVE     Ketones, ur NEGATIVE  NEGATIVE (mg/dL)    Protein, ur 30 (*) NEGATIVE (mg/dL)    Urobilinogen, UA 0.2  0.0 - 1.0 (mg/dL)    Nitrite NEGATIVE  NEGATIVE     Leukocytes, UA NEGATIVE  NEGATIVE    URINE RAPID DRUG SCREEN (HOSP PERFORMED)     Status: Normal   Collection Time   06/07/11  8:56 AM      Component Value Range Comment   Opiates NONE DETECTED  NONE DETECTED     Cocaine NONE DETECTED  NONE DETECTED     Benzodiazepines NONE DETECTED  NONE DETECTED  Amphetamines NONE DETECTED  NONE DETECTED     Tetrahydrocannabinol NONE DETECTED  NONE DETECTED     Barbiturates NONE DETECTED  NONE DETECTED    URINE MICROSCOPIC-ADD ON     Status: Normal   Collection Time   06/07/11  8:56 AM      Component Value Range Comment   WBC, UA 0-2  <3 (WBC/hpf)    RBC / HPF 0-2  <3 (RBC/hpf)    Bacteria, UA RARE  RARE    HEMOGLOBIN A1C     Status: Abnormal   Collection Time   06/07/11  1:20 PM      Component Value Range Comment   Hemoglobin A1C 6.3 (*) <5.7 (%)    Mean Plasma  Glucose 134 (*) <117 (mg/dL)   TSH     Status: Normal   Collection Time   06/07/11  1:20 PM      Component Value Range Comment   TSH 1.243  0.350 - 4.500 (uIU/mL)   COMPREHENSIVE METABOLIC PANEL     Status: Abnormal   Collection Time   06/08/11  5:32 AM      Component Value Range Comment   Sodium 143  135 - 145 (mEq/L)    Potassium 3.7  3.5 - 5.1 (mEq/L)    Chloride 109  96 - 112 (mEq/L)    CO2 24  19 - 32 (mEq/L)    Glucose, Bld 109 (*) 70 - 99 (mg/dL)    BUN 15  6 - 23 (mg/dL)    Creatinine, Ser 4.09  0.50 - 1.35 (mg/dL)    Calcium 8.5  8.4 - 10.5 (mg/dL)    Total Protein 6.1  6.0 - 8.3 (g/dL)    Albumin 3.1 (*) 3.5 - 5.2 (g/dL)    AST 83 (*) 0 - 37 (U/L)    ALT 24  0 - 53 (U/L)    Alkaline Phosphatase 50  39 - 117 (U/L)    Total Bilirubin 0.6  0.3 - 1.2 (mg/dL)    GFR calc non Af Amer 65 (*) >90 (mL/min)    GFR calc Af Amer 76 (*) >90 (mL/min)   CBC     Status: Abnormal   Collection Time   06/08/11  5:32 AM      Component Value Range Comment   WBC 7.7  4.0 - 10.5 (K/uL)    RBC 4.13 (*) 4.22 - 5.81 (MIL/uL)    Hemoglobin 10.8 (*) 13.0 - 17.0 (g/dL)    HCT 81.1 (*) 91.4 - 52.0 (%)    MCV 79.7  78.0 - 100.0 (fL)    MCH 26.2  26.0 - 34.0 (pg)    MCHC 32.8  30.0 - 36.0 (g/dL)    RDW 78.2  95.6 - 21.3 (%)    Platelets 151  150 - 400 (K/uL)     Studies/Results: Ct Head Wo Contrast  06/07/2011  *RADIOLOGY REPORT*  Clinical Data: Seizure.  Confusion.  CT HEAD WITHOUT CONTRAST  Technique:  Contiguous axial images were obtained from the base of the skull through the vertex without contrast.  Comparison: 09/13/2009  Findings: The brain has a normal appearance without evidence of malformation, atrophy, old or acute infarction, mass lesion, hemorrhage, hydrocephalus or extra-axial collection. Atherosclerotic calcification effects major vessels at the base of the brain.  The calvarium is unremarkable.  Sinuses, middle ears and mastoids are clear.  IMPRESSION: No acute or focal finding.   Normal appearance of the brain. Atherosclerosis.  Original Report Authenticated By: Janeece Riggers  Karin Golden, M.D.   Mr Laqueta Jean [facility] Contrast  06/08/2011  *RADIOLOGY REPORT*  Clinical Data: Seizures.  High blood pressure.  MRI HEAD WITHOUT AND WITH CONTRAST  Technique:  Multiplanar, multiecho pulse sequences of the brain and surrounding structures were obtained according to standard protocol without and with intravenous contrast  Contrast: 20mL MULTIHANCE GADOBENATE DIMEGLUMINE 529 MG/ML IV SOLN  Comparison: 06/07/2011 CT.  No comparison MR.  Findings: No acute infarct.  No intracranial hemorrhage.  No evidence of mesial temporal sclerosis.  Scattered nonspecific white matter type changes probably related to result of small vessel disease in this hypertensive patient.  No hydrocephalus.  No intracranial mass or abnormal enhancement.  Mild exophthalmos.  Mild transverse ligament hypertrophy.  Minimal ethmoid sinus and maxillary sinus mucosal thickening.  IMPRESSION: No acute infarct.  Small vessel disease type changes.  No seizure focus or intracranial mass identified.  Exophthalmos.  Original Report Authenticated By: Fuller Canada, M.D.    Patient Active Problem List  Diagnoses  . Seizure  . HTN (hypertension)  . Tonic clonic convulsion  . OSA (obstructive sleep apnea)  . Hyperglycemia    Impression: Recurrent seizure within a two-year span. Rule out adverse reaction between Cialis and doxazosin. This has been noted to cause hypotension.  Question if this increases risk for having a seizure at this time. Hypertension. Insulin resistance. Obstructive sleep apnea. The patient has been compliant with his CPAP.   Plan: Followup EEG as recommended. Start Keppra per neurology recommendations. Review literature for interaction of Cialis in doxazosin regarding seizures. Followup in a.m.    August Saucer, Jayce Boyko 06/08/2011 9:27 PM

## 2011-06-09 ENCOUNTER — Encounter: Payer: Self-pay | Admitting: Neurology

## 2011-06-09 DIAGNOSIS — G40309 Generalized idiopathic epilepsy and epileptic syndromes, not intractable, without status epilepticus: Secondary | ICD-10-CM

## 2011-06-09 MED ORDER — DIVALPROEX SODIUM ER 500 MG PO TB24
1000.0000 mg | ORAL_TABLET | Freq: Every day | ORAL | Status: DC
  Filled 2011-06-09: qty 2

## 2011-06-09 MED ORDER — DIVALPROEX SODIUM ER 500 MG PO TB24
500.0000 mg | ORAL_TABLET | Freq: Every day | ORAL | Status: DC
  Filled 2011-06-09: qty 1

## 2011-06-09 NOTE — Care Management Note (Signed)
    Page 1 of 1   06/10/2011     9:33:33 AM   CARE MANAGEMENT NOTE 06/10/2011  Patient:  Novoa,[name]   Account Number:  [id number]  Date Initiated:  06/09/2011  Documentation initiated by:  Onnie Boer  Subjective/Objective Assessment:   PT WAS ADMITTED FOR NEW ONSET SEIZURES     Action/Plan:   PROGRESSION OF CARE AND DISCHARGE PLANNING   Anticipated DC Date:  06/10/2011   Anticipated DC Plan:  HOME/SELF CARE      DC Planning Services  CM consult      Choice offered to / List presented to:             Status of service:  Completed, signed off Medicare Important Message given?   (If response is "NO", the following Medicare IM given date fields will be blank) Date Medicare IM given:   Date Additional Medicare IM given:    Discharge Disposition:  HOME/SELF CARE  Per UR Regulation:  Reviewed for med. necessity/level of care/duration of stay  If discussed at Long Length of Stay Meetings, dates discussed:    Comments:  06/10/11 Onnie Boer, RN, BSN [phone] PT DC'D TO HOME WITH SELF CARE  06/09/11 Onnie Boer, RN, BSN 1045 PT WAS ADMITTED WITH NEW ONSET OF SEIZURES.  PTA PT WAS AT HOME WITH HIS FAMILY.  WILL F/U ON DC NEEDS

## 2011-06-09 NOTE — Progress Notes (Signed)
Discharge instructions reviewed with pt and wife. Education provided.  Pt verbalized understanding, copy of instructions given to pt. Pt d/c'd via wheelchair with belongings escorted by hospital volunteer.

## 2011-06-09 NOTE — Progress Notes (Signed)
Subjective: Patient without complaints today.  Has had no further seizures.  Feels that he is back to baseline.  LFT's and renal function improved.  EEG normal.  MRI of the brain reviewed and unremarkable as well.    Objective: Current vital signs: BP 152/70  Pulse 50  Temp(Src) 98.3 F (36.8 C) (Oral)  Resp 18  Ht 6' 2.02" (1.88 m)  Wt 102.059 kg (225 lb)  BMI 28.88 kg/m2  SpO2 100% Vital signs in last 24 hours: Temp:  [98.3 F (36.8 C)-99 F (37.2 C)] 98.3 F (36.8 C) (05/22 0630) Pulse Rate:  [50-60] 50  (05/22 0630) Resp:  [16-18] 18  (05/22 0630) BP: (152-153)/(68-84) 152/70 mmHg (05/22 0630) SpO2:  [96 %-100 %] 100 % (05/22 0630)  Intake/Output from previous day:   Intake/Output this shift:   Nutritional status: Cardiac  Neurologic Exam: Mental Status: Alert, oriented, thought content appropriate.  Speech fluent without evidence of aphasia.  Able to follow 3 step commands without difficulty. Cranial Nerves: II: visual fields grossly normal, pupils equal, round, reactive to light and accommodation III,IV, VI: ptosis not present, extra-ocular motions intact bilaterally V,VII: smile symmetric, facial light touch sensation normal bilaterally VIII: hearing normal bilaterally IX,X: gag reflex present XI: trapezius strength/neck flexion strength normal bilaterally XII: tongue strength normal  Motor: Right : Upper extremity   5/5    Left:     Upper extremity   5/5  Lower extremity   5/5     Lower extremity   5/5 Tone and bulk:normal tone throughout; no atrophy noted Sensory: Pinprick and light touch intact throughout, bilaterally Cerebellar: normal finger-to-nose and normal heel-to-shin test  Lab Results: Results for orders placed during the hospital encounter of 06/07/11 (from the past 48 hour(s))  HEMOGLOBIN A1C     Status: Abnormal   Collection Time   06/07/11  1:20 PM      Component Value Range Comment   Hemoglobin A1C 6.3 (*) <5.7 (%)    Mean Plasma Glucose  134 (*) <117 (mg/dL)   TSH     Status: Normal   Collection Time   06/07/11  1:20 PM      Component Value Range Comment   TSH 1.243  0.350 - 4.500 (uIU/mL)   COMPREHENSIVE METABOLIC PANEL     Status: Abnormal   Collection Time   06/08/11  5:32 AM      Component Value Range Comment   Sodium 143  135 - 145 (mEq/L)    Potassium 3.7  3.5 - 5.1 (mEq/L)    Chloride 109  96 - 112 (mEq/L)    CO2 24  19 - 32 (mEq/L)    Glucose, Bld 109 (*) 70 - 99 (mg/dL)    BUN 15  6 - 23 (mg/dL)    Creatinine, Ser 0.98  0.50 - 1.35 (mg/dL)    Calcium 8.5  8.4 - 10.5 (mg/dL)    Total Protein 6.1  6.0 - 8.3 (g/dL)    Albumin 3.1 (*) 3.5 - 5.2 (g/dL)    AST 83 (*) 0 - 37 (U/L)    ALT 24  0 - 53 (U/L)    Alkaline Phosphatase 50  39 - 117 (U/L)    Total Bilirubin 0.6  0.3 - 1.2 (mg/dL)    GFR calc non Af Amer 65 (*) >90 (mL/min)    GFR calc Af Amer 76 (*) >90 (mL/min)   CBC     Status: Abnormal   Collection Time   06/08/11  5:32  AM      Component Value Range Comment   WBC 7.7  4.0 - 10.5 (K/uL)    RBC 4.13 (*) 4.22 - 5.81 (MIL/uL)    Hemoglobin 10.8 (*) 13.0 - 17.0 (g/dL)    HCT 16.1 (*) 09.6 - 52.0 (%)    MCV 79.7  78.0 - 100.0 (fL)    MCH 26.2  26.0 - 34.0 (pg)    MCHC 32.8  30.0 - 36.0 (g/dL)    RDW 04.5  40.9 - 81.1 (%)    Platelets 151  150 - 400 (K/uL)      Studies/Results: Mr Laqueta Jean Wo Contrast  06/08/2011  *RADIOLOGY REPORT*  Clinical Data: Seizures.  High blood pressure.  MRI HEAD WITHOUT AND WITH CONTRAST  Technique:  Multiplanar, multiecho pulse sequences of the brain and surrounding structures were obtained according to standard protocol without and with intravenous contrast  Contrast: 20mL MULTIHANCE GADOBENATE DIMEGLUMINE 529 MG/ML IV SOLN  Comparison: 06/07/2011 CT.  No comparison MR.  Findings: No acute infarct.  No intracranial hemorrhage.  No evidence of mesial temporal sclerosis.  Scattered nonspecific white matter type changes probably related to result of small vessel disease in this  hypertensive patient.  No hydrocephalus.  No intracranial mass or abnormal enhancement.  Mild exophthalmos.  Mild transverse ligament hypertrophy.  Minimal ethmoid sinus and maxillary sinus mucosal thickening.  IMPRESSION: No acute infarct.  Small vessel disease type changes.  No seizure focus or intracranial mass identified.  Exophthalmos.  Original Report Authenticated By: Fuller Canada, M.D.    Medications:  I have reviewed the patient's current medications. Scheduled:   . amLODipine  10 mg Oral Daily  . benazepril  40 mg Oral Daily  . doxazosin  8 mg Oral QHS  . gadobenate dimeglumine  20 mL Intravenous Once  . loratadine  10 mg Oral Daily  . metoprolol succinate  100 mg Oral Daily    Assessment/Plan:  Patient Active Hospital Problem List:  Seizure (06/07/2011)   Assessment: Work up unremarkable.  Patient has had no further seizures.  Although there has been a recommendation to start antiepileptics, the patient wishes to not start this presently.  He would like to await further follow up.  Although EEG and MRI unremarkable patient would likely benefit from a sleep study to confirm this his CPAP is adequately compensating for his sleep apnea.  Otherwise this may provide a risk factor for seizure.   Plan:  1.  Repeat sleep study with CPAP to be performed as an outpatient.  2.  Patient to follow up with neurology as an outpatient after sleep study performed.  No antiepileptics to be started at this time.    3.  Restrictions reinforced with patient and wife.    No further neurologic intervention is recommended at this time.  If further questions arise, please call or page at that time.  Thank you for allowing neurology to participate in the care of this patient.  Thana Farr, MD [facility] [phone] 06/09/2011  10:30 AM     LOS: 2 days   Thana Farr, MD [facility] [phone] 06/09/2011  10:05 AM

## 2011-06-09 NOTE — Procedures (Signed)
EEG NUMBER:  [id number]  This routine EEG was requested in a 66 year old male who was admitted with new onset seizures with altered mental status.  He has a history of seizures for about 1-2 years.  This has occurred during sleep.  His medications include levetiracetam and Ativan.  The EEG was done with the patient awake and drowsy.  During periods of maximal wakefulness, he had a moderately regulated moderately sustained low to medium amplitude, 9-10 cycle per second posterior dominant rhythm that attenuated with eye opening, was symmetric.  Background activities are composed of low amplitude beta activities that were frontally dominant and symmetric.  Photic stimulation did not produce a driving response.  Hyperventilation for 3 minutes with good effort did not markedly change the tracing.  The patient did appear to become drowsy as characterized by attenuation of muscle activity as well as the alpha rhythm.  No stage II sleep seen.  EKG did reveal what appeared to be a sinus arrhythmia.  CLINICAL INTERPRETATION:  This routine EEG done with the patient awake and drowsy is normal.          ______________________________ Denton Meek, MD    [id number] D:  06/09/2011 06:59:48  T:  06/09/2011 07:31:45  Job #:  [id number]

## 2011-06-10 NOTE — Discharge Summary (Signed)
Physician Discharge Summary  Patient ID: Joffrey Kerce MRN: [id number] DOB/AGE: 03/14/1945 66 y.o.  Admit date: 06/07/2011 Discharge date: 06/09/2011   Discharge Diagnoses:   Tonic-clonic seizure Hypertension Obstructive sleep apnea Insulin resistance Rule out adverse drug reaction to doxazosin.  Discharged Condition: improved  Operations/Procedues: MRI scan of the head. EEG.   Hospital Course: see admission H&P for details.  Patient was admitted to neurology for further evaluation of new onset of tonic-clonic seizures. Patient was seen in consultation by Dr. Thad Ranger of neurology as well. Patient underwent MRI scan of the head and EEG which showed no abnormal tumors or foci for possible seizure  Activity.  His history was reviewed again as well. It was noted that he had recently restarted Cialis 5 mg daily. There have been reports associated with Cialis and  doxazosin causes seizures. He was told however by neurological evaluation that as this occurred as patient was supine the likelihood of this being precipitated by hypotension was smaller. Patient noted also has sleep apnea and had not had his settings checked recently.  Notably by 5/22 2013, patient was feeling well. He did not have significant memory for the events surrounding his seizure. Treatment options were reviewed with patient. Patient was not inclined to stay on anticonvulsant medications. He was significant that patient's only other episode that was similar to this was associated with the use of a similar agent in the past with doxazosin. It was recommended that patient followup with a repeat sleep study to document adequacy of control.   Significant Diagnostic Studies: EEG and MRI scan.   Disposition: 01-Home or Self Care  Discharge Orders    Future Appointments: Provider: Department: Dept Phone: Center:   08/11/2011 8:30 AM Milas Gain, MD [facility] [phone] None     Future Orders Please Complete By  Expires   Diet - low sodium heart healthy      Increase activity slowly      No wound care      Discharge instructions      Comments:   Follow up with primary care physician in one week.     Medication List  As of 06/10/2011 12:27 AM   STOP taking these medications         tadalafil 5 MG tablet         TAKE these medications         amLODipine 10 MG tablet   Commonly known as: NORVASC   Take 10 mg by mouth daily.      benazepril 40 MG tablet   Commonly known as: LOTENSIN   Take 40 mg by mouth daily.      doxazosin 8 MG tablet   Commonly known as: CARDURA   Take 8 mg by mouth at bedtime.      fexofenadine 60 MG tablet   Commonly known as: ALLEGRA   Take 60 mg by mouth daily as needed. For allergies.      metoprolol succinate 100 MG 24 hr tablet   Commonly known as: TOPROL-XL   Take 100 mg by mouth daily. Take with or immediately following a meal.      naproxen 500 MG tablet   Commonly known as: NAPROSYN   Take 500 mg by mouth daily as needed. For inflammation.      VITAMIN D PO   Take 2 capsules by mouth daily.          The necessary precautions regarding driving and operating equipment was reviewed with patient.  SignedAugust Saucer, Keyshon Stein 06/10/2011, 12:27 AM

## 2011-08-06 ENCOUNTER — Emergency Department (HOSPITAL_COMMUNITY)
Admission: EM | Admit: 2011-08-06 | Discharge: 2011-08-06 | Disposition: A | Discharge status: Home / Self Care | Payer: 59 | Attending: Emergency Medicine | Admitting: Emergency Medicine

## 2011-08-06 ENCOUNTER — Encounter (HOSPITAL_COMMUNITY): Payer: Self-pay | Admitting: Emergency Medicine

## 2011-08-06 ENCOUNTER — Emergency Department (HOSPITAL_COMMUNITY): Payer: 59

## 2011-08-06 DIAGNOSIS — Z23 Encounter for immunization: Secondary | ICD-10-CM | POA: Insufficient documentation

## 2011-08-06 DIAGNOSIS — X58XXXA Exposure to other specified factors, initial encounter: Secondary | ICD-10-CM | POA: Insufficient documentation

## 2011-08-06 DIAGNOSIS — I10 Essential (primary) hypertension: Secondary | ICD-10-CM | POA: Insufficient documentation

## 2011-08-06 DIAGNOSIS — S01502A Unspecified open wound of oral cavity, initial encounter: Secondary | ICD-10-CM | POA: Insufficient documentation

## 2011-08-06 DIAGNOSIS — R569 Unspecified convulsions: Secondary | ICD-10-CM | POA: Insufficient documentation

## 2011-08-06 DIAGNOSIS — N400 Enlarged prostate without lower urinary tract symptoms: Secondary | ICD-10-CM | POA: Insufficient documentation

## 2011-08-06 DIAGNOSIS — M1990 Unspecified osteoarthritis, unspecified site: Secondary | ICD-10-CM | POA: Insufficient documentation

## 2011-08-06 LAB — CBC
Hemoglobin: 12.5 g/dL — ABNORMAL LOW (ref 13.0–17.0)
MCH: 26.2 pg (ref 26.0–34.0)
MCHC: 33.2 g/dL (ref 30.0–36.0)
MCV: 79 fL (ref 78.0–100.0)
Platelets: 158 10*3/uL (ref 150–400)
RBC: 4.77 MIL/uL (ref 4.22–5.81)

## 2011-08-06 LAB — COMPREHENSIVE METABOLIC PANEL
CO2: 21 mEq/L (ref 19–32)
Calcium: 8.9 mg/dL (ref 8.4–10.5)
Creatinine, Ser: 1.13 mg/dL (ref 0.50–1.35)
GFR calc Af Amer: 77 mL/min — ABNORMAL LOW (ref >90)
GFR calc non Af Amer: 66 mL/min — ABNORMAL LOW (ref >90)
Glucose, Bld: 164 mg/dL — ABNORMAL HIGH (ref 70–99)

## 2011-08-06 MED ORDER — PHENYTOIN SODIUM EXTENDED 100 MG PO CAPS
400.0000 mg | ORAL_CAPSULE | Freq: Once | ORAL | Status: AC
  Administered 2011-08-06: 400 mg via ORAL
  Filled 2011-08-06: qty 4

## 2011-08-06 MED ORDER — PHENYTOIN SODIUM EXTENDED 100 MG PO CAPS: 100.0000 mg | ORAL_CAPSULE | Freq: Three times a day (TID) | ORAL | Status: DC

## 2011-08-06 MED ORDER — TETANUS-DIPHTH-ACELL PERTUSSIS 5-2.5-18.5 LF-MCG/0.5 IM SUSP
0.5000 mL | Freq: Once | INTRAMUSCULAR | Status: AC
  Administered 2011-08-06: 0.5 mL via INTRAMUSCULAR
  Filled 2011-08-06: qty 0.5

## 2011-08-06 NOTE — ED Notes (Signed)
PT ambulated with a steady gait; VSS; A&Ox3; no signs of distress; respirations even and unlabored; skin warm and dry; no questions at this time.  

## 2011-08-06 NOTE — ED Notes (Signed)
Wife reports that everytime pt has seizures, he is on a new medicine. 1 2 years ago; 1 in May; neurologist is at [facility]. Ct scan of head done in May was clean according to wife.

## 2011-08-06 NOTE — ED Notes (Signed)
Pt has hx of seizures and they were deemed medication related; pt does not take antiseizure meds. Pt was post ictal upon EMS arrival. Seizure lasted 2 minutes and was combative when EMS on scene. Got 2.5 of versed which helped. Pt A&Ox3. Oral trauma and incontinent of scened.

## 2011-08-06 NOTE — ED Provider Notes (Signed)
History     CSN: [id number]  Arrival date & time 08/06/11  0404   First MD Initiated Contact with Patient 08/06/11 0405      Chief Complaint  Patient presents with  . Seizures    (Consider location/radiation/quality/duration/timing/severity/associated sxs/prior treatment) Patient is a 66 y.o. male presenting with seizures. The history is provided by the patient.  Seizures  This is a new problem. The current episode started less than 1 hour ago. The problem has been rapidly improving. There was 1 seizure. The most recent episode lasted 30 to 120 seconds. Characteristics include rhythmic jerking, loss of consciousness and bit tongue. Characteristics do not include eye blinking, eye deviation, bowel incontinence, bladder incontinence, apnea or cyanosis. The episode was witnessed. There was no sensation of an aura present. The seizures did not continue in the ED. The seizure(s) had no focality. Possible causes include med or dosage change. There has been no fever. The fever has been present for less than 1 day. Meds prior to arrival: iv versed.    Past Medical History  Diagnosis Date  . Hypertension   . Allergy   . Osteoarthrosis, unspecified whether generalized or localized, unspecified site   . Seizures     history of 1 a year ago  . Enlarged prostate     No past surgical history on file.  No family history on file.  History  Substance Use Topics  . Smoking status: Never Smoker   . Smokeless tobacco: Not on file  . Alcohol Use: No      Review of Systems  Respiratory: Negative for apnea.   Cardiovascular: Negative for cyanosis.  Gastrointestinal: Negative for bowel incontinence.  Genitourinary: Negative for bladder incontinence.  Neurological: Positive for seizures and loss of consciousness.  All other systems reviewed and are negative.    Allergies  Review of patient's allergies indicates no known allergies.  Home Medications   Current Outpatient Rx  Name  Route Sig Dispense Refill  . AMLODIPINE BESYLATE 10 MG PO TABS Oral Take 10 mg by mouth daily.    Marland Kitchen BENAZEPRIL HCL 40 MG PO TABS Oral Take 40 mg by mouth daily.    Marland Kitchen VITAMIN D PO Oral Take 2 capsules by mouth daily.    Marland Kitchen DOXAZOSIN MESYLATE 8 MG PO TABS Oral Take 8 mg by mouth at bedtime.    Marland Kitchen HYDROCHLOROTHIAZIDE 12.5 MG PO CAPS Oral Take 12.5 mg by mouth daily.    Marland Kitchen METOPROLOL SUCCINATE ER 100 MG PO TB24 Oral Take 100 mg by mouth daily. Take with or immediately following a meal.      BP 151/81  Pulse 89  Temp 98.3 F (36.8 C) (Oral)  Resp 19  SpO2 95%  Physical Exam  Constitutional: He is oriented to person, place, and time. He appears well-developed and well-nourished.  HENT:  Head: Normocephalic and atraumatic.       Superficial tongue laceration  Eyes: Conjunctivae are normal. Pupils are equal, round, and reactive to light.  Neck: Normal range of motion. Neck supple.  Cardiovascular: Normal rate, regular rhythm, normal heart sounds and intact distal pulses.   Pulmonary/Chest: Effort normal and breath sounds normal.  Abdominal: Soft. Bowel sounds are normal.  Neurological: He is alert and oriented to person, place, and time.  Skin: Skin is warm and dry.  Psychiatric: He has a normal mood and affect. His behavior is normal. Judgment and thought content normal.    ED Course  Procedures (including critical care time)  Labs  Reviewed  CBC - Abnormal; Notable for the following:    WBC 10.6 (*)     Hemoglobin 12.5 (*)     HCT 37.7 (*)     All other components within normal limits  COMPREHENSIVE METABOLIC PANEL   No results found.   No diagnosis found.    MDM  + seizure.  Improved. Mental status.  Await ct.  Oral dilantin load.  Most likely dc to fu with appt with pmd today, and outpt neuro fu subseqently        Kendrick Haapala Lytle Michaels, MD 08/06/11 [phone]

## 2011-08-06 NOTE — ED Notes (Signed)
Patient transported to CT 

## 2011-08-09 ENCOUNTER — Encounter: Payer: Self-pay | Admitting: Neurology

## 2011-08-09 ENCOUNTER — Ambulatory Visit (INDEPENDENT_AMBULATORY_CARE_PROVIDER_SITE_OTHER): Payer: 59 | Admitting: Neurology

## 2011-08-09 VITALS — BP 140/80 | HR 72 | Ht 74.0 in | Wt 219.0 lb

## 2011-08-09 DIAGNOSIS — R569 Unspecified convulsions: Secondary | ICD-10-CM

## 2011-08-09 MED ORDER — LEVETIRACETAM 250 MG PO TABS: 750.0000 mg | ORAL_TABLET | Freq: Two times a day (BID) | ORAL | Status: DC

## 2011-08-09 NOTE — Patient Instructions (Addendum)
Dilantin  take 1 twice a day today, take 1 tablet tomorrow, on Wednesday start Keppra as below: Titration to Keppra(levetiracetam) 750mg  twice a day using  Keppra 250mg  tablets.   Morning Dose Evening Dose  Week 1 0 tablets  1 tablet (250mg )  Week 2 1 tablet (250mg ) 1 tablet (250mg )  Week 3 1 tablet (250mg ) 2 tablets (500mg )  Week 4 2 tablets (500mg ) 2 tablets (500mg )  Week 5 2 tablets (500mg ) 3 tablets (750mg )  Week 6 3 tablets (750mg ) 3 tablets (750mg )  After Week 6 continue at 3 tablets twice per day. Titration requires 147 tablets 250mg  tablets.

## 2011-08-09 NOTE — Progress Notes (Signed)
Dear Dr. August Saucer,  Thank you for having me see Joel Duarte in consultation today at [facility] for his problem with 3 nocturnal convulsive events.  As you may recall, he is a 66 y.o. year old male with a history of hypertension who had his first convulsive event about 2 years ago.  It occurred at around 7 a.m. and was described as bilateral shaking, with eyes rolling back and then "arm flailing".  He had tongue biting and urinary incontinence.  He had a similar event that occurred around 6 a.m. in May 2013 but was confused for much longer afterwards as he was given Ativan 4mg  in the E.D for agitation.  An MRI at that time was unremarkable except for mild small vessel disease.  Routine EEG read by myself was also unremarkable.  At that time he elected not to treat with an AED.  Finally, he had one just 3 days ago.  He was started on Dilantin 100 tid.  This occurred at 3:50 a.m. on Friday and was only 1-2 minutes in length.  There was no arm flailing.  Patient returned to normal soon after the EMS arrived.  He is finding the Dilantin is giving him a headache.  The patient has never had similar events during the day.  Family is wondering if it has anything to do with the sinus spray he uses, or the Cialis he just started before the second seizure or the Avodart he just started before the first seizure.  He also was on amoxicillin during this last seizure.    Past Medical History  Diagnosis Date  . Hypertension   . Allergy   . Osteoarthrosis, unspecified whether generalized or localized, unspecified site   . Seizures     history of 1 a year ago  . Enlarged prostate   - no history of head trauma, birth trauma, febrile seizures, development delay or intracranial infections  Past Surgical History  Procedure Date  . No surgical h/o July 2013    History   Social History  . Marital Status: Married    Spouse Name: N/A    Number of Children: N/A  . Years of Education: N/A   Social  History Main Topics  . Smoking status: Never Smoker   . Smokeless tobacco: Never Used  . Alcohol Use: No  . Drug Use: No  . Sexually Active: None   Other Topics Concern  . None   Social History Narrative  . None    No family history on file.  Current Outpatient Prescriptions on File Prior to Visit  Medication Sig Dispense Refill  . amLODipine (NORVASC) 10 MG tablet Take 10 mg by mouth daily.      . benazepril (LOTENSIN) 40 MG tablet Take 40 mg by mouth daily.      . Cholecalciferol (VITAMIN D PO) Take 2 capsules by mouth daily.      Marland Kitchen doxazosin (CARDURA) 8 MG tablet Take 8 mg by mouth at bedtime.      . metoprolol succinate (TOPROL-XL) 100 MG 24 hr tablet Take 100 mg by mouth daily. Take with or immediately following a meal.      . spironolactone (ALDACTONE) 25 MG tablet Take 25 mg by mouth daily.      Marland Kitchen levETIRAcetam (KEPPRA) 250 MG tablet Take 3 tablets (750 mg total) by mouth 2 (two) times daily. Please increase as per titration schedule.  180 tablet  3    No Known Allergies    ROS:  13 systems were reviewed and are notable for recurrent snoring in the setting of OSA treatment.  All other review of systems are unremarkable.   Examination:  Filed Vitals:   08/09/11 1525  BP: 140/80  Pulse: 72  Height: 6\' 2"  (1.88 m)  Weight: 219 lb (99.338 kg)     In general, well appearing man.  Cardiovascular: The patient has a regular rate and rhythm and no carotid bruits.  Fundoscopy:  Disks are flat. Vessel caliber within normal limits.  Mental status:   The patient is oriented to person, place and time. Recent and remote memory are intact. Attention span and concentration are normal. Language including repetition, naming, following commands are intact. Fund of knowledge of current and historical events, as well as vocabulary are normal.  Cranial Nerves: Pupils are equally round and reactive to light. Visual fields full to confrontation. Extraocular movements are intact  without nystagmus. Facial sensation and muscles of mastication are intact. Muscles of facial expression are symmetric. Hearing intact to bilateral finger rub. Tongue protrusion, uvula, palate midline.  Shoulder shrug intact  Motor:  The patient has normal bulk and tone, no pronator drift.  There are no adventitious movements.  5/5 muscle strength bilaterally.  Reflexes:   Biceps  Triceps Brachioradialis Knee Ankle  Right 2+  2+  2+   2+ 1+  Left  2+  2+  2+   2+ 1+  Toes down  Coordination:  Normal finger to nose.  No dysdiadokinesia.  Sensation is decreased to temperature and vibration in his feet, position normal.  Gait and Station are normal.  Tandem gait is intact.  Romberg is negative  MRI was reviewed - normal hippocampi, no obvious focal abnormalities.   Impression/Recs: 1.  Seizures - Patient is having nocturnal seizures, possibly of frontal lobe origin, but in my mind not clearly not related to administration of the medications mentioned.  Given that all 3 have been nocturnal I think it is ok he continues to drive.  He should however, let the DMV know his seizure diagnosis.  I think Dilantin is a poor long term medication and have asked him to taper off it and we will start Keppra 250qd->750 bid titration.  If he does not respond to treatment I would get a sleep deprived EEG to assess. 2.  OSA - he is going to follow up with Dr. Maple Hudson about getting a repeat sleep study to see if his CPAP is working correctly. 3.  ?Peripheral neuropathy - I did not work this up properly today, but will order PN labs when he returns.  He is asymptomatic.    We will see the patient back in September.  Thank you for having [place] see Joel Duarte in consultation.  Feel free to contact me with any questions.  Lupita Raider Modesto Charon, MD [facility], [place] [address] [place], [place] [address] Phone: [phone] Fax: [phone].

## 2011-08-11 ENCOUNTER — Ambulatory Visit: Payer: Self-pay | Admitting: Neurology

## 2011-08-11 ENCOUNTER — Ambulatory Visit: Payer: 59 | Admitting: Neurology

## 2011-09-12 IMAGING — CR DG CHEST 1V PORT
1 series · 1 of 1 positions shown · non-contrast
Comparison: Two-view chest x-ray 12/09/2004.

CLINICAL DATA: New onset seizures.

PORTABLE CHEST - 1 VIEW 09/13/2009:

[view not recorded]
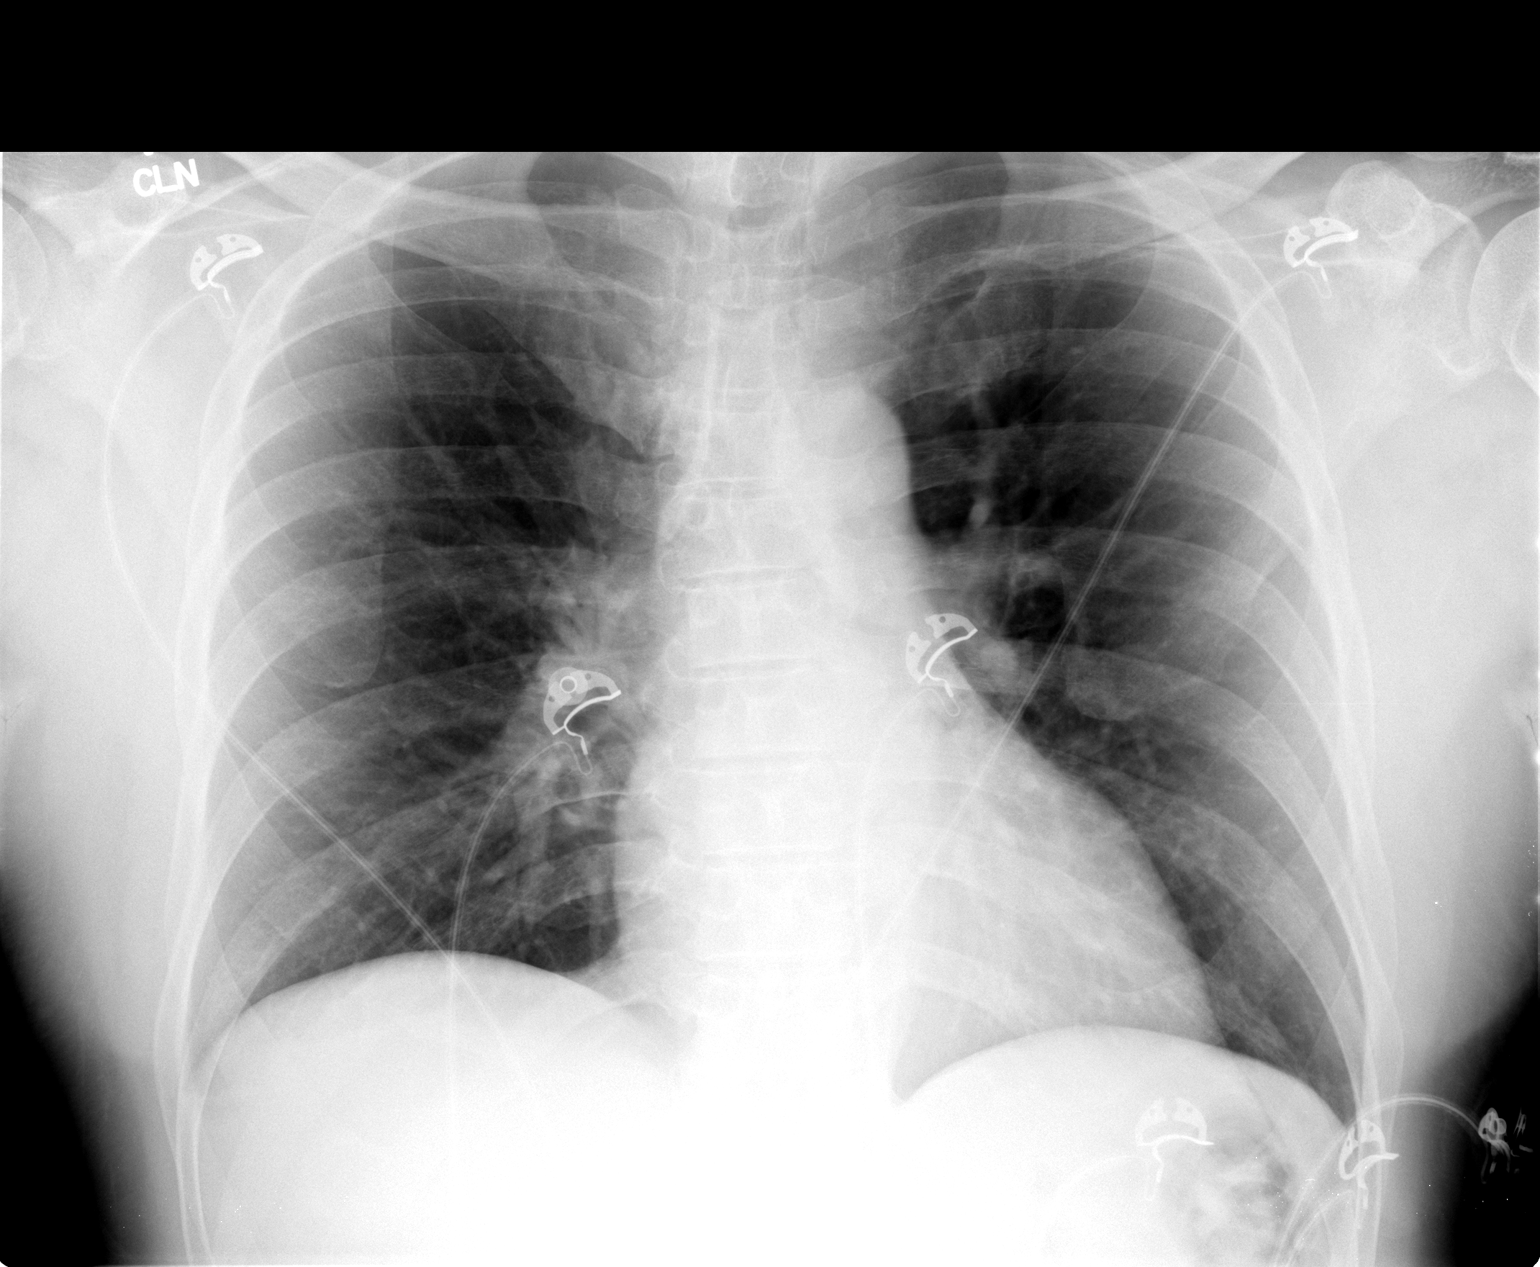

[1 of 1 positions shown; findings below may reference images not displayed]

FINDINGS: Cardiac silhouette  normal and mediastinal contours
unremarkable for the AP portable technique.  Lungs clear.
Pulmonary vascularity normal.  Bronchovascular markings normal.  No
pneumothorax.  No pleural effusions. No significant interval
change.
IMPRESSION: No acute cardiopulmonary disease.

## 2011-09-24 ENCOUNTER — Encounter: Payer: Self-pay | Admitting: Neurology

## 2011-09-24 ENCOUNTER — Ambulatory Visit (INDEPENDENT_AMBULATORY_CARE_PROVIDER_SITE_OTHER): Payer: 59 | Admitting: Neurology

## 2011-09-24 VITALS — BP 158/84 | HR 64 | Wt 219.0 lb

## 2011-09-24 DIAGNOSIS — G40109 Localization-related (focal) (partial) symptomatic epilepsy and epileptic syndromes with simple partial seizures, not intractable, without status epilepticus: Secondary | ICD-10-CM

## 2011-09-24 MED ORDER — LEVETIRACETAM 750 MG PO TABS: 750.0000 mg | ORAL_TABLET | Freq: Two times a day (BID) | ORAL | Status: DC

## 2011-09-24 NOTE — Progress Notes (Signed)
Dear Dr. August Saucer,   Thank you for having me see [name] in followup today at [facility] for his problem with 3 nocturnal convulsive events. As you may recall, he is a 66 y.o. year old male with a history of hypertension who had his first convulsive event about 2 years ago. It occurred at around 7 a.m. and was described as bilateral shaking, with eyes rolling back and then "arm flailing". He had tongue biting and urinary incontinence. He had a similar event that occurred around 6 a.m. in May 2013 but was confused for much longer afterwards as he was given Ativan 4mg  in the E.D for agitation. An MRI at that time was unremarkable except for mild small vessel disease. Routine EEG read by myself was also unremarkable. At that time he elected not to treat with an AED.   Finally, he had one just 3 days ago. He was started on Dilantin 100 tid. This occurred at 3:50 a.m. on Friday and was only 1-2 minutes in length. There was no arm flailing. Patient returned to normal soon after the EMS arrived. He is finding the Dilantin is giving him a headache.   The patient has never had similar events during the day. Family is wondering if it has anything to do with the sinus spray he uses, or the Cialis he just started before the second seizure or the Avodart he just started before the first seizure. He also was on amoxicillin during this last seizure.  --------------------------------  That was the state of things at his last visit.  Today he returns now only on the Keppra 750 bid.  He is tolerating it well.  No further nocturnal events.     Medical history, social history, and family history were reviewed and have not changed since the last clinic visit.  Current Outpatient Prescriptions on File Prior to Visit  Medication Sig Dispense Refill  . amLODipine (NORVASC) 10 MG tablet Take 10 mg by mouth daily.      . benazepril (LOTENSIN) 40 MG tablet Take 40 mg by mouth daily.      . Cholecalciferol (VITAMIN  D PO) Take 2 capsules by mouth daily.      Marland Kitchen doxazosin (CARDURA) 8 MG tablet Take 8 mg by mouth at bedtime.      . metoprolol succinate (TOPROL-XL) 100 MG 24 hr tablet Take 100 mg by mouth daily. Take with or immediately following a meal.      . spironolactone (ALDACTONE) 25 MG tablet Take 25 mg by mouth daily.      Marland Kitchen DISCONTD: levETIRAcetam (KEPPRA) 250 MG tablet Take 3 tablets (750 mg total) by mouth 2 (two) times daily. Please increase as per titration schedule.  180 tablet  3    No Known Allergies  ROS:  13 systems were reviewed and  are unremarkable.  Exam: . Filed Vitals:   09/24/11 0859  BP: 158/84  Pulse: 64  Weight: 219 lb (99.338 kg)    In general, well appearing man.    Impression/Recommendations:  1.  Nocturnal convulsive events - possibly frontal lobe seizures - continue Keppra 750mg  bid.  He will follow up with me in 3 months at [facility].   Lupita Raider Modesto Charon, MD [facility], [place]

## 2011-10-29 ENCOUNTER — Telehealth: Payer: Self-pay | Admitting: Neurology

## 2011-10-29 NOTE — Telephone Encounter (Signed)
Called and spoke with the patient. I wanted to let him know that his records were ready for pick up or that I could fax them to where ever he needed them to go as he was to follow Dr. Modesto Charon at [facility]. He states he was not going to [place] as his insurance would not cover it so he wants to f/u her with Dr. Arbutus Leas. He is due for an appointment in December so I asked that he call in November to make an appointment. He states he will. No other issues voiced at this time.

## 2011-11-30 LAB — BASIC METABOLIC PANEL
BUN: 16 mg/dL (ref 4–21)
Creatinine: 1.1 mg/dL (ref 0.6–1.3)

## 2012-01-03 ENCOUNTER — Ambulatory Visit: Payer: Medicare Other | Admitting: Neurology

## 2012-01-03 ENCOUNTER — Encounter: Payer: Self-pay | Admitting: Neurology

## 2012-01-03 ENCOUNTER — Ambulatory Visit (INDEPENDENT_AMBULATORY_CARE_PROVIDER_SITE_OTHER): Payer: 59 | Admitting: Neurology

## 2012-01-03 VITALS — BP 128/76 | HR 72 | Temp 97.6°F | Resp 18 | Wt 220.0 lb

## 2012-01-03 DIAGNOSIS — R569 Unspecified convulsions: Secondary | ICD-10-CM

## 2012-01-03 NOTE — Patient Instructions (Addendum)
We will contact you in five months for your six month follow up.

## 2012-01-03 NOTE — Progress Notes (Signed)
Dear Dr. August Saucer,   Thank you for having me see Joel Duarte in followup today at [facility] for his problem with 3 nocturnal convulsive events.  I reviewed Dr. Nash Dimmer previous notes.  I have updated the patient's history and physical.    As you may recall, he is a 66 y.o. year old male with a history of hypertension who had his first convulsive event about 2 years ago. It occurred at around 7 a.m. and was described as bilateral shaking, with eyes rolling back and then "arm flailing". He had tongue biting and urinary incontinence. He had a similar event that occurred around 6 a.m. in May 2013 but was confused for much longer afterwards as he was given Ativan 4mg  in the E.D for agitation. An MRI at that time was unremarkable except for mild small vessel disease. Routine EEG read by myself was also unremarkable. At that time he elected not to treat with an AED.   Finally, he had one in July, 2013. He was started on Dilantin 100 tid. This occurred at 3:50 a.m. on Friday and was only 1-2 minutes in length. There was no arm flailing. Patient returned to normal soon after the EMS arrived. He was finding the Dilantin was giving him a headache and ultimately it was changed to Keppra.  He has been seizure-free since last visit in September.  The patient has never had similar events during the day.  There is no family history of seizure.  He is currently on Keppra, 750 mg twice a day.  He denies any side effects with the Keppra.  He is fairly compliant with the medication.  He does admit to sometimes not getting enough sleep.  No Known Allergies   Current Outpatient Prescriptions on File Prior to Visit  Medication Sig Dispense Refill  . amLODipine (NORVASC) 10 MG tablet Take 10 mg by mouth daily.      . benazepril (LOTENSIN) 40 MG tablet Take 40 mg by mouth daily.      Marland Kitchen doxazosin (CARDURA) 8 MG tablet Take 8 mg by mouth at bedtime.      . levETIRAcetam (KEPPRA) 750 MG tablet Take 1 tablet (750 mg total)  by mouth 2 (two) times daily.  60 tablet  11  . metoprolol succinate (TOPROL-XL) 100 MG 24 hr tablet Take 100 mg by mouth daily. Take with or immediately following a meal.       Past Medical History  Diagnosis Date  . Hypertension   . Allergy   . Osteoarthrosis, unspecified whether generalized or localized, unspecified site   . Seizures     history of 1 a year ago  . Enlarged prostate    Past Surgical History  Procedure Date  . No surgical h/o Dec 2013   History   Social History  . Marital Status: Married    Spouse Name: N/A    Number of Children: N/A  . 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 Smoking status: Never Smoker   . Smokeless tobacco: Never Used  . Alcohol Use: No  . Drug Use: No  . Sexually Active: Not on file   Other Topics Concern  . Not on file   Social History Narrative  . No narrative on file   Family Status  Relation Status Death Age  . Mother Alive     HTN  . Father Deceased     unknown cause  .  Sister Alive     healthy  . Child Alive     2, healthy     ROS:  13 systems were reviewed and  are unremarkable.  Exam: . Filed Vitals:   01/03/12 0815  BP: 128/76  Pulse: 72  Temp: 97.6 F (36.4 C)  Resp: 18  Weight: 220 lb (99.791 kg)   Gen:  Appears stated age and in NAD. HEENT:  Normocephalic, atraumatic. The mucous membranes are moist. The superficial temporal arteries are without ropiness or tenderness. Cardiovascular: Regular rate and rhythm with 2-3/6 SEM. Lungs: Clear to auscultation bilaterally. Neck: There are no carotid bruits noted bilaterally.  NEUROLOGICAL:  Orientation:  The patient is alert and oriented x 3.  Recent and remote memory are intact.  Attention span and concentration are normal.  Able to name objects and repeat without trouble.  Fund of knowledge is appropriate Cranial nerves: There is good facial symmetry. The pupils are equal round and  reactive to light bilaterally. Fundoscopic exam reveals clear disc margins bilaterally. Extraocular muscles are intact and visual fields are full to confrontational testing. Speech is fluent and clear. Soft palate rises symmetrically and there is no tongue deviation. Hearing is intact to conversational tone. Tone: Tone is good throughout. Sensation: Sensation is intact to light touch and pinprick throughout (facial, trunk, extremities). Vibration is intact at the bilateral ankle. There is no extinction with double simultaneous stimulation. There is no sensory dermatomal level identified. Coordination:  The patient has no difficulty with RAM's or FNF bilaterally. Motor: Strength is 5/5 in the bilateral upper and lower extremities.  Shoulder shrug is equal bilaterally.  There is no pronator drift.  There are no fasciculations noted. DTR's: Deep tendon reflexes are 2/4 at the bilateral biceps, triceps, brachioradialis, and patella.  Plantar responses are downgoing bilaterally. Gait and Station: The patient is able to ambulate without difficulty. The patient is able to heel toe walk without any difficulty. The patient is able to ambulate in a tandem fashion. The patient is able to stand in the Romberg position.    LABS:  Lab Results  Component Value Date   WBC 10.6* 08/06/2011   HGB 12.5* 08/06/2011   HCT 37.7* 08/06/2011   MCV 79.0 08/06/2011   PLT 158 08/06/2011     Chemistry      Component Value Date/Time   NA 141 08/06/2011 0457   K 3.4* 08/06/2011 0457   CL 106 08/06/2011 0457   CO2 21 08/06/2011 0457   BUN 21 08/06/2011 0457   CREATININE 1.13 08/06/2011 0457      Component Value Date/Time   CALCIUM 8.9 08/06/2011 0457   ALKPHOS 54 08/06/2011 0457   AST 19 08/06/2011 0457   ALT 11 08/06/2011 0457   BILITOT 0.5 08/06/2011 0457       Impression/Recommendations:   1.  Nocturnal convulsive events - possibly frontal lobe seizures   - continue Keppra 750mg  bid.  -The patient and I did discuss  seizure and safety.  We talked about the importance of proper sleep among other sz/safety items.  -If he is doing well and is seizure free, I will see him back in 6 months.  If he needs me before then, he certainly can call.

## 2012-02-18 ENCOUNTER — Encounter (HOSPITAL_COMMUNITY): Payer: Self-pay | Admitting: General Practice

## 2012-02-18 DIAGNOSIS — J309 Allergic rhinitis, unspecified: Secondary | ICD-10-CM | POA: Insufficient documentation

## 2012-02-18 DIAGNOSIS — J300 Vasomotor rhinitis: Secondary | ICD-10-CM | POA: Insufficient documentation

## 2012-02-18 DIAGNOSIS — N529 Male erectile dysfunction, unspecified: Secondary | ICD-10-CM | POA: Insufficient documentation

## 2012-06-18 HISTORY — PX: OTHER SURGICAL HISTORY: SHX169

## 2012-07-05 ENCOUNTER — Ambulatory Visit (INDEPENDENT_AMBULATORY_CARE_PROVIDER_SITE_OTHER): Payer: 59 | Admitting: Neurology

## 2012-07-05 ENCOUNTER — Encounter: Payer: Self-pay | Admitting: Neurology

## 2012-07-05 VITALS — BP 136/80 | HR 60 | Temp 98.0°F | Resp 16 | Wt 225.0 lb

## 2012-07-05 DIAGNOSIS — G4752 REM sleep behavior disorder: Secondary | ICD-10-CM

## 2012-07-05 DIAGNOSIS — G40109 Localization-related (focal) (partial) symptomatic epilepsy and epileptic syndromes with simple partial seizures, not intractable, without status epilepticus: Secondary | ICD-10-CM

## 2012-07-05 MED ORDER — LEVETIRACETAM 750 MG PO TABS: 750.0000 mg | ORAL_TABLET | Freq: Two times a day (BID) | ORAL | Status: DC

## 2012-07-05 NOTE — Patient Instructions (Signed)
1.  I will see you in a year.  Call me if you need me before then!

## 2012-07-05 NOTE — Progress Notes (Signed)
Dear Dr. August Saucer,   Thank you for having me see Joel Duarte in followup today at [facility] for his problem with 3 nocturnal convulsive events.  I reviewed Dr. Nash Dimmer previous notes.  I have updated the patient's history and physical.    As you may recall, he is a 67 y.o. year old male with a history of hypertension who had his first convulsive event about 2 years ago. It occurred at around 7 a.m. and was described as bilateral shaking, with eyes rolling back and then "arm flailing". He had tongue biting and urinary incontinence. He had a similar event that occurred around 6 a.m. in May 2013 but was confused for much longer afterwards as he was given Ativan 4mg  in the E.D for agitation. An MRI at that time was unremarkable except for mild small vessel disease. Routine EEG read by myself was also unremarkable. At that time he elected not to treat with an AED.   Finally, he had one in July, 2013. He was started on Dilantin 100 tid. This occurred at 3:50 a.m. on Friday and was only 1-2 minutes in length. There was no arm flailing. Patient returned to normal soon after the EMS arrived. He was finding the Dilantin was giving him a headache and ultimately it was changed to Keppra.  He has been seizure-free since last visit in September.  The patient has never had similar events during the day.  There is no family history of seizure.  He is currently on Keppra, 750 mg twice a day.  He denies any side effects with the Keppra.  He is fairly compliant with the medication.  He does admit to sometimes not getting enough sleep.   He admits to crazy dreams and acting out the dreams.    No Known Allergies   Current Outpatient Prescriptions on File Prior to Visit  Medication Sig Dispense Refill  . amLODipine (NORVASC) 10 MG tablet Take 10 mg by mouth daily.      . benazepril (LOTENSIN) 40 MG tablet Take 40 mg by mouth daily.      Marland Kitchen doxazosin (CARDURA) 8 MG tablet Take 8 mg by mouth at bedtime.      .  levETIRAcetam (KEPPRA) 750 MG tablet Take 1 tablet (750 mg total) by mouth 2 (two) times daily.  60 tablet  11  . metoprolol succinate (TOPROL-XL) 100 MG 24 hr tablet Take 100 mg by mouth daily. Take with or immediately following a meal.      . naproxen (NAPROSYN) 500 MG tablet Take 500 mg by mouth daily as needed.      Marland Kitchen spironolactone (ALDACTONE) 25 MG tablet Take 25 mg by mouth daily.       No current facility-administered medications on file prior to visit.   Past Medical History  Diagnosis Date  . Hypertension   . Allergy   . Osteoarthrosis, unspecified whether generalized or localized, unspecified site   . Seizures     history of 1 a year ago  . Enlarged prostate    Past Surgical History  Procedure Laterality Date  . No surgical h/o  June 2014   History   Social History  . Marital Status: Married    Spouse Name: N/A    Number of Children: N/A  . 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 Smoking status: Never Smoker   . Smokeless tobacco: Never Used  . Alcohol  Use: No  . Drug Use: No  . Sexually Active: Not on file   Other Topics Concern  . Not on file   Social History Narrative  . No narrative on file   Family Status  Relation Status Death Age  . Mother Alive     HTN  . Father Deceased     unknown cause  . Sister Alive     healthy  . Child Alive     2, healthy     ROS:  13 systems were reviewed and  are unremarkable.  Exam: . Filed Vitals:   07/05/12 1042  BP: 136/80  Pulse: 60  Temp: 98 F (36.7 C)  Resp: 16  Weight: 225 lb (102.059 kg)   Gen:  Appears stated age and in NAD. HEENT:  Normocephalic, atraumatic. The mucous membranes are moist. The superficial temporal arteries are without ropiness or tenderness. Cardiovascular: Regular rate and rhythm with 2-3/6 SEM. Lungs: Clear to auscultation bilaterally. Neck: There are no carotid bruits noted  bilaterally.  NEUROLOGICAL:  Orientation:  The patient is alert and oriented x 3.  Recent and remote memory are intact.  Attention span and concentration are normal.  Able to name objects and repeat without trouble.  Fund of knowledge is appropriate Cranial nerves: There is good facial symmetry. The pupils are equal round and reactive to light bilaterally. Fundoscopic exam reveals clear disc margins bilaterally. Extraocular muscles are intact and visual fields are full to confrontational testing. Speech is fluent and clear. Soft palate rises symmetrically and there is no tongue deviation. Hearing is intact to conversational tone. Tone: Tone is good throughout. Sensation: Sensation is intact to light touch and pinprick throughout (facial, trunk, extremities). Vibration is intact at the bilateral ankle. There is no extinction with double simultaneous stimulation. There is no sensory dermatomal level identified. Coordination:  The patient has no difficulty with RAM's or FNF bilaterally. Motor: Strength is 5/5 in the bilateral upper and lower extremities.  Shoulder shrug is equal bilaterally.  There is no pronator drift.  There are no fasciculations noted. DTR's: Deep tendon reflexes are 2/4 at the bilateral biceps, triceps, brachioradialis, and patella.  Plantar responses are downgoing bilaterally. Gait and Station: The patient is able to ambulate without difficulty. The patient is able to heel toe walk without any difficulty. The patient is able to ambulate in a tandem fashion. The patient is able to stand in the Romberg position.    LABS:  Lab Results  Component Value Date   WBC 10.6* 08/06/2011   HGB 12.5* 08/06/2011   HCT 37.7* 08/06/2011   MCV 79.0 08/06/2011   PLT 158 08/06/2011     Chemistry      Component Value Date/Time   NA 139 11/30/2011   NA 141 08/06/2011 0457   K 3.9 11/30/2011   CL 106 08/06/2011 0457   CO2 21 08/06/2011 0457   BUN 16 11/30/2011   BUN 21 08/06/2011 0457    CREATININE 1.1 11/30/2011   CREATININE 1.13 08/06/2011 0457   GLU 96 11/30/2011      Component Value Date/Time   CALCIUM 8.9 08/06/2011 0457   ALKPHOS 54 08/06/2011 0457   AST 14 11/30/2011   ALT 11 11/30/2011   BILITOT 0.5 08/06/2011 0457       Impression/Recommendations:   1.  Nocturnal convulsive events - possibly frontal lobe seizures   - continue Keppra 750mg  bid.  -The patient and I did discuss seizure and safety.  We talked about the  importance of proper sleep among other sz/safety items.  -If he is doing well and is seizure free, I will see him back in 1 yr.  If he needs me before then, he certainly can call. 2.. REM behavior disorder.  -The patient states that he is actually doing better in this regard.  I told him if it becomes a problem, we certainly can consider the addition of something like clonazepam.  He wants to hold on that for now. 3.  he felt he had lab work in March and I will try to get a copy of that since I do not have any lab work from this year.

## 2012-07-07 ENCOUNTER — Encounter: Payer: Self-pay | Admitting: Neurology

## 2012-08-23 ENCOUNTER — Other Ambulatory Visit: Payer: Self-pay

## 2012-11-23 ENCOUNTER — Other Ambulatory Visit: Payer: Self-pay

## 2013-03-08 ENCOUNTER — Other Ambulatory Visit (HOSPITAL_COMMUNITY): Payer: Self-pay | Admitting: Urology

## 2013-03-08 DIAGNOSIS — C61 Malignant neoplasm of prostate: Secondary | ICD-10-CM

## 2013-03-27 ENCOUNTER — Ambulatory Visit (HOSPITAL_COMMUNITY)
Admission: RE | Admit: 2013-03-27 | Discharge: 2013-03-27 | Disposition: A | Discharge status: Home / Self Care | Payer: 59 | Source: Ambulatory Visit | Attending: Urology | Admitting: Urology

## 2013-03-27 DIAGNOSIS — R972 Elevated prostate specific antigen [PSA]: Secondary | ICD-10-CM | POA: Insufficient documentation

## 2013-03-27 DIAGNOSIS — C61 Malignant neoplasm of prostate: Secondary | ICD-10-CM | POA: Insufficient documentation

## 2013-03-27 LAB — POCT I-STAT, CHEM 8
BUN: 15 mg/dL (ref 6–23)
CALCIUM ION: 1.19 mmol/L (ref 1.13–1.30)
Chloride: 104 mEq/L (ref 96–112)
Creatinine, Ser: 1.2 mg/dL (ref 0.50–1.35)
GLUCOSE: 115 mg/dL — AB (ref 70–99)
HEMATOCRIT: 41 % (ref 39.0–52.0)
HEMOGLOBIN: 13.9 g/dL (ref 13.0–17.0)
Potassium: 4.2 mEq/L (ref 3.7–5.3)
Sodium: 143 mEq/L (ref 137–147)
TCO2: 26 mmol/L (ref 0–100)

## 2013-03-27 MED ORDER — GADOBENATE DIMEGLUMINE 529 MG/ML IV SOLN
20.0000 mL | Freq: Once | INTRAVENOUS | Status: AC | PRN
  Administered 2013-03-27: 20 mL via INTRAVENOUS

## 2013-07-11 ENCOUNTER — Ambulatory Visit (INDEPENDENT_AMBULATORY_CARE_PROVIDER_SITE_OTHER): Payer: 59

## 2013-07-11 ENCOUNTER — Ambulatory Visit (INDEPENDENT_AMBULATORY_CARE_PROVIDER_SITE_OTHER): Payer: 59 | Admitting: Podiatry

## 2013-07-11 DIAGNOSIS — M722 Plantar fascial fibromatosis: Secondary | ICD-10-CM

## 2013-07-11 DIAGNOSIS — R52 Pain, unspecified: Secondary | ICD-10-CM

## 2013-07-11 MED ORDER — TRIAMCINOLONE ACETONIDE 10 MG/ML IJ SUSP
10.0000 mg | Freq: Once | INTRAMUSCULAR | Status: AC
  Administered 2013-07-11: 10 mg

## 2013-07-11 NOTE — Progress Notes (Signed)
   Subjective:    Patient ID: Joel Duarte, male    DOB: 1945-12-09, 68 y.o.   MRN: [id number]  HPI  Pt c/o of left heel pain for 2 weeks now, pain is dull and achey and radiates to the arch of his foot, he states that he only gets this pain when he is mobile. No treatments have been done at this time  Review of Systems  All other systems reviewed and are negative.      Objective:   Physical Exam        Assessment & Plan:

## 2013-07-11 NOTE — Patient Instructions (Signed)

## 2013-07-12 NOTE — Progress Notes (Signed)
Subjective:     Patient ID: Joel Duarte, male   DOB: December 17, 1945, 68 y.o.   MRN: [id number]  Foot Pain   patient presents with significant heel pain on the bottom of the left heel stating it's really been bothering him and making it hard for him to the active   Review of Systems  All other systems reviewed and are negative.      Objective:   Physical Exam  Nursing note and vitals reviewed. Constitutional: He is oriented to person, place, and time.  Cardiovascular: Intact distal pulses.   Musculoskeletal: Normal range of motion.  Neurological: He is oriented to person, place, and time.  Skin: Skin is warm.   neurovascular status found to be intact with muscle strength adequate and range of motion of the subtalar and midtarsal joint within normal limits. Patient's found to have digits that are well perfused and is noted to have acute inflammation with pain and swelling at the insertion of the fascia into the medial calcaneus     Assessment:     Plan her fasciitis of the medial band with acute noted    Plan:     H&P and x-ray reviewed. Injected the plantar fascial left 3 mg Kenalog 5 of Xylocaine Marcaine mixture placed in fascially brace and reappoint if symptoms persist

## 2013-09-27 ENCOUNTER — Ambulatory Visit (INDEPENDENT_AMBULATORY_CARE_PROVIDER_SITE_OTHER): Payer: 59 | Admitting: Neurology

## 2013-09-27 ENCOUNTER — Encounter: Payer: Self-pay | Admitting: Neurology

## 2013-09-27 VITALS — BP 142/82 | HR 60 | Resp 14 | Ht 74.0 in | Wt 223.0 lb

## 2013-09-27 DIAGNOSIS — G40109 Localization-related (focal) (partial) symptomatic epilepsy and epileptic syndromes with simple partial seizures, not intractable, without status epilepticus: Secondary | ICD-10-CM

## 2013-09-27 MED ORDER — LEVETIRACETAM 750 MG PO TABS: 750.0000 mg | ORAL_TABLET | Freq: Two times a day (BID) | ORAL | Status: DC

## 2013-09-27 NOTE — Progress Notes (Signed)
Dear Dr. Marlou Sa,   Thank you for having me see Joel Duarte in followup today at [facility] for his problem with 3 nocturnal convulsive events.  I reviewed Dr. Jodi Mourning previous notes.  I have updated the patient's history and physical.    As you may recall, he is a 68 y.o. year old male with a history of hypertension who had his first convulsive event about 2 years ago. It occurred at around 7 a.m. and was described as bilateral shaking, with eyes rolling back and then "arm flailing". He had tongue biting and urinary incontinence. He had a similar event that occurred around 6 a.m. in May 2013 but was confused for much longer afterwards as he was given Ativan 4mg  in the E.D for agitation. An MRI at that time was unremarkable except for mild small vessel disease. Routine EEG read by myself was also unremarkable. At that time he elected not to treat with an AED.   Finally, he had one in July, 2013. He was started on Dilantin 100 tid. This occurred at 3:50 a.m. on Friday and was only 1-2 minutes in length. There was no arm flailing. Patient returned to normal soon after the EMS arrived. He was finding the Dilantin was giving him a headache and ultimately it was changed to [place].  He has been seizure-free since last visit in September.  The patient has never had similar events during the day.  There is no family history of seizure.  He is currently on Keppra, 750 mg twice a day.  He denies any side effects with the Keppra.  He is fairly compliant with the medication.  He does admit to sometimes not getting enough sleep.   He admits to crazy dreams and acting out the dreams.    09/27/13 update:  Pt has a hx of frontal lobe seizure.  On keppra 750 mg bid.  Seizure free since 09/2011.  Doing well.  No SE.  No new medical problems.    No Known Allergies   Current Outpatient Prescriptions on File Prior to Visit  Medication Sig Dispense Refill  . amLODipine (NORVASC) 10 MG tablet Take 10 mg by mouth daily.       . benazepril (LOTENSIN) 40 MG tablet Take 40 mg by mouth daily.      Marland Kitchen doxazosin (CARDURA) 8 MG tablet Take 8 mg by mouth at bedtime.      . metoprolol succinate (TOPROL-XL) 100 MG 24 hr tablet Take 100 mg by mouth daily. Take with or immediately following a meal.      . naproxen (NAPROSYN) 500 MG tablet Take 500 mg by mouth daily as needed.      Marland Kitchen spironolactone (ALDACTONE) 25 MG tablet Take 25 mg by mouth daily.       No current facility-administered medications on file prior to visit.   Past Medical History  Diagnosis Date  . Hypertension   . Allergy   . Osteoarthrosis, unspecified whether generalized or localized, unspecified site   . Seizures     history of 1 a year ago  . Enlarged prostate    Past Surgical History  Procedure Laterality Date  . No surgical h/o  June 2014   History   Social History  . Marital Status: Married    Spouse Name: N/A    Number of Children: N/A  . 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 Smoking status:  Never Smoker   . Smokeless tobacco: Never Used  . Alcohol Use: No  . Drug Use: No  . Sexual Activity: Not on file   Other Topics Concern  . Not on file   Social History Narrative  . No narrative on file   Family Status  Relation Status Death Age  . Mother Alive     HTN  . Father Deceased     unknown cause  . Sister Alive     healthy  . Child Alive     2, healthy     ROS:  13 systems were reviewed and  are unremarkable.  Exam: . Filed Vitals:   09/27/13 1007  BP: 142/82  Pulse: 60  Resp: 14  Height: 6\' 2"  (1.88 m)  Weight: 223 lb (101.152 kg)   Gen:  Appears stated age and in NAD. HEENT:  Normocephalic, atraumatic. The mucous membranes are moist. The superficial temporal arteries are without ropiness or tenderness. Cardiovascular: Regular rate and rhythm with 2-3/6 SEM. Lungs: Clear to auscultation bilaterally. Neck: There are no carotid  bruits noted bilaterally.  NEUROLOGICAL:  Orientation:  The patient is alert and oriented x 3.  Recent and remote memory are intact.  Attention span and concentration are normal.  Able to name objects and repeat without trouble.  Fund of knowledge is appropriate Cranial nerves: There is good facial symmetry. The pupils are equal round and reactive to light bilaterally. Fundoscopic exam reveals clear disc margins bilaterally. Extraocular muscles are intact and visual fields are full to confrontational testing. Speech is fluent and clear. Soft palate rises symmetrically and there is no tongue deviation. Hearing is intact to conversational tone. Tone: Tone is good throughout. Sensation: Sensation is intact to light touch throughout Motor: Strength is 5/5 in the bilateral upper and lower extremities.  Shoulder shrug is equal bilaterally.  There is no pronator drift.  There are no fasciculations noted. DTR's: Deep tendon reflexes are 2/4 at the bilateral biceps, triceps, brachioradialis, and patella.  Plantar responses are downgoing bilaterally. Gait and Station: The patient is able to ambulate without difficulty.  LABS:  Lab Results  Component Value Date   WBC 10.6* 08/06/2011   HGB 13.9 03/27/2013   HCT 41.0 03/27/2013   MCV 79.0 08/06/2011   PLT 158 08/06/2011     Chemistry      Component Value Date/Time   NA 143 03/27/2013 1012   NA 139 11/30/2011   K 4.2 03/27/2013 1012   CL 104 03/27/2013 1012   CO2 21 08/06/2011 0457   BUN 15 03/27/2013 1012   BUN 16 11/30/2011   CREATININE 1.20 03/27/2013 1012   CREATININE 1.1 11/30/2011   GLU 96 11/30/2011      Component Value Date/Time   CALCIUM 8.9 08/06/2011 0457   ALKPHOS 54 08/06/2011 0457   AST 14 11/30/2011   ALT 11 11/30/2011   BILITOT 0.5 08/06/2011 0457       Impression/Recommendations:   1.  Nocturnal convulsive events - possibly frontal lobe seizures   - continue Keppra 750mg  bid.  -The patient and I did discuss seizure and safety.   We talked about the importance of proper sleep among other sz/safety items.  -If he is doing well and is seizure free, I will see him back in 1 yr.  If he needs me before then, he certainly can call. 2.. REM behavior disorder.  -The patient states that he is actually doing better in this regard.  I told him if  it becomes a problem, we certainly can consider the addition of something like clonazepam.  He wants to hold on that for now.

## 2014-07-15 ENCOUNTER — Other Ambulatory Visit: Payer: Self-pay

## 2014-09-03 ENCOUNTER — Ambulatory Visit (HOSPITAL_BASED_OUTPATIENT_CLINIC_OR_DEPARTMENT_OTHER): Payer: Self-pay | Attending: Internal Medicine

## 2014-09-04 ENCOUNTER — Ambulatory Visit (HOSPITAL_BASED_OUTPATIENT_CLINIC_OR_DEPARTMENT_OTHER): Payer: 59 | Attending: Internal Medicine | Admitting: Radiology

## 2014-09-04 VITALS — Ht 74.0 in | Wt 225.0 lb

## 2014-09-04 DIAGNOSIS — G4710 Hypersomnia, unspecified: Secondary | ICD-10-CM | POA: Diagnosis present

## 2014-09-04 DIAGNOSIS — R0683 Snoring: Secondary | ICD-10-CM | POA: Insufficient documentation

## 2014-09-04 DIAGNOSIS — G4736 Sleep related hypoventilation in conditions classified elsewhere: Secondary | ICD-10-CM | POA: Diagnosis not present

## 2014-09-04 DIAGNOSIS — Z9989 Dependence on other enabling machines and devices: Secondary | ICD-10-CM

## 2014-09-04 DIAGNOSIS — G4733 Obstructive sleep apnea (adult) (pediatric): Secondary | ICD-10-CM | POA: Diagnosis not present

## 2014-10-01 ENCOUNTER — Ambulatory Visit (HOSPITAL_BASED_OUTPATIENT_CLINIC_OR_DEPARTMENT_OTHER): Payer: 59 | Attending: Internal Medicine | Admitting: Radiology

## 2014-10-01 DIAGNOSIS — R0683 Snoring: Secondary | ICD-10-CM | POA: Diagnosis not present

## 2014-10-01 DIAGNOSIS — G4733 Obstructive sleep apnea (adult) (pediatric): Secondary | ICD-10-CM | POA: Insufficient documentation

## 2014-10-01 DIAGNOSIS — G4736 Sleep related hypoventilation in conditions classified elsewhere: Secondary | ICD-10-CM | POA: Diagnosis not present

## 2014-10-05 DIAGNOSIS — G4733 Obstructive sleep apnea (adult) (pediatric): Secondary | ICD-10-CM | POA: Diagnosis not present

## 2014-10-07 ENCOUNTER — Other Ambulatory Visit: Payer: Self-pay | Admitting: Neurology

## 2014-10-07 NOTE — Telephone Encounter (Signed)
Keppra refill requested. Per last office note- patient to remain on medication. Refill approved for one month and sent to patient's pharmacy. He will need follow up for additional refills.

## 2014-10-26 NOTE — Progress Notes (Signed)
   Patient Name: Kristy, Catoe Date: 10/01/2014 Gender: Male D.O.B: 1945-10-20 Age (years): 69 Referring Provider: Carlena Sax Height (inches): 31 Interpreting Physician: Baird Lyons MD, ABSM Weight (lbs): 225 RPSGT: Jacolyn Reedy BMI: 31 MRN: [id number] Neck Size: 17.00 CLINICAL INFORMATION Sleep Study Type: Unattended Home Sleep Test  Indication for sleep study: 780.54 Hypersomnia, OSA   Epworth Sleepiness Score: 6/24  SLEEP STUDY TECHNIQUE A multi-channel overnight portable sleep study was performed. The channels recorded were: nasal airflow, thoracic respiratory movement, and oxygen saturation with a pulse oximetry. Snoring was also monitored.  MEDICATIONS Patient self administered medications include:charted for review.  SLEEP ARCHITECTURE Patient was studied for 408.2 minutes. The sleep efficiency was 97.5 % and the patient was supine for 72.5%. The arousal index was 0.0 per hour.  RESPIRATORY PARAMETERS The overall AHI was 13.8 per hour, with a central apnea index of 0.9 per hour.  The oxygen nadir was 81% during sleep.  CARDIAC DATA Mean heart rate during sleep was 57.8 bpm.  IMPRESSIONS Mild obstructive sleep apnea occurred during this study (AHI = 13.8/h). No significant central sleep apnea occurred during this study (CAI = 0.9/h). Moderate oxygen desaturation was noted during this study (Min O2 = 81%). Mean O2 saturation on room air during sleep was 93% Patient snored 26.1% of sleep time during sleep.  DIAGNOSIS Obstructive Sleep Apnea (327.23 [G47.33 ICD-10]) Nocturnal Hypoxemia (327.26 [G47.36 ICD-10])  RECOMMENDATIONS Treatment options based on judgment of managing provider. Avoid alcohol, sedatives and other CNS depressants that may worsen sleep apnea and disrupt normal sleep architecture. Sleep hygiene should be reviewed to assess factors that may improve sleep quality. Weight management and regular exercise should be initiated or  continued.    Deneise Lever Diplomate, American Board of Sleep Medicine  ELECTRONICALLY SIGNED ON:  10/26/2014, 9:43 AM [place] PH: [phone]   FX: [phone] [place]

## 2014-11-12 ENCOUNTER — Ambulatory Visit (INDEPENDENT_AMBULATORY_CARE_PROVIDER_SITE_OTHER): Payer: 59 | Admitting: Neurology

## 2014-11-12 ENCOUNTER — Encounter: Payer: Self-pay | Admitting: Neurology

## 2014-11-12 VITALS — BP 140/88 | HR 74 | Ht 74.0 in | Wt 222.0 lb

## 2014-11-12 DIAGNOSIS — G40109 Localization-related (focal) (partial) symptomatic epilepsy and epileptic syndromes with simple partial seizures, not intractable, without status epilepticus: Secondary | ICD-10-CM | POA: Diagnosis not present

## 2014-11-12 MED ORDER — LEVETIRACETAM 750 MG PO TABS: 750.0000 mg | ORAL_TABLET | Freq: Two times a day (BID) | ORAL | Status: DC

## 2014-11-12 NOTE — Progress Notes (Signed)
Dear Dr. Marlou Sa,   Thank you for having me see Joel Duarte in followup today at [facility] for his problem with 3 nocturnal convulsive events.  I reviewed Dr. Jodi Mourning previous notes.  I have updated the patient's history and physical.    As you may recall, he is a 69 y.o. year old male with a history of hypertension who had his first convulsive event about 2 years ago. It occurred at around 7 a.m. and was described as bilateral shaking, with eyes rolling back and then "arm flailing". He had tongue biting and urinary incontinence. He had a similar event that occurred around 6 a.m. in May 2013 but was confused for much longer afterwards as he was given Ativan 4mg  in the E.D for agitation. An MRI at that time was unremarkable except for mild small vessel disease. Routine EEG read by myself was also unremarkable. At that time he elected not to treat with an AED.   Finally, he had one in July, 2013. He was started on Dilantin 100 tid. This occurred at 3:50 a.m. on Friday and was only 1-2 minutes in length. There was no arm flailing. Patient returned to normal soon after the EMS arrived. He was finding the Dilantin was giving him a headache and ultimately it was changed to [place].  He has been seizure-free since last visit in September.  The patient has never had similar events during the day.  There is no family history of seizure.  He is currently on Keppra, 750 mg twice a day.  He denies any side effects with the Keppra.  He is fairly compliant with the medication.  He does admit to sometimes not getting enough sleep.   He admits to crazy dreams and acting out the dreams.    09/27/13 update:  Pt has a hx of frontal lobe seizure.  On keppra 750 mg bid.  Seizure free since 09/2011.  Doing well.  No SE.  No new medical problems.    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  Remains on keppra 750 mg bid.  Doing well.  No SE.  No seizures.  No new medical issues.  Seizure free since  2013.  States updated on labs via PCP.  Just had labs 2 weeks ago.    No Known Allergies   Current Outpatient Prescriptions on File Prior to Visit  Medication Sig Dispense Refill  . amLODipine (NORVASC) 10 MG tablet Take 10 mg by mouth daily.    . benazepril (LOTENSIN) 40 MG tablet Take 40 mg by mouth daily.    Marland Kitchen doxazosin (CARDURA) 8 MG tablet Take 8 mg by mouth at bedtime.    . levETIRAcetam (KEPPRA) 750 MG tablet TAKE 1 TABLET BY MOUTH 2 TIMES DAILY 60 tablet 0  . metoprolol succinate (TOPROL-XL) 100 MG 24 hr tablet Take 100 mg by mouth daily. Take with or immediately following a meal.    . naproxen (NAPROSYN) 500 MG tablet Take 500 mg by mouth daily as needed.    Marland Kitchen spironolactone (ALDACTONE) 25 MG tablet Take 25 mg by mouth daily.     No current facility-administered medications on file prior to visit.   Past Medical History  Diagnosis Date  . Hypertension   . Allergy   . Osteoarthrosis, unspecified whether generalized or localized, unspecified site   . Seizures ([place])     history of 1 a year ago  . Enlarged prostate    Past Surgical History  Procedure  Laterality Date  . No surgical h/o  June 2014   Social History   Social History  . Marital Status: Married    Spouse Name: N/A  . Number of Children: N/A  . 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 Smoking status: Never Smoker   . Smokeless tobacco: Never Used  . Alcohol Use: No  . Drug Use: No  . Sexual Activity: Not on file   Other Topics Concern  . Not on file   Social History Narrative   Family Status  Relation Status Death Age  . Mother Alive     HTN  . Father Deceased     unknown cause  . Sister Alive     healthy  . Child Alive     2, healthy     ROS:  13 systems were reviewed and  are unremarkable.  Exam: . Filed Vitals:   11/12/14 0856  BP: 140/88  Pulse: 74  Height: 6\' 2"  (1.88 m)  Weight: 222 lb (100.699 kg)    Gen:  Appears stated age and in NAD. HEENT:  Normocephalic, atraumatic. The mucous membranes are moist. The superficial temporal arteries are without ropiness or tenderness. Cardiovascular: Regular rate and rhythm with 2-3/6 SEM. Lungs: Clear to auscultation bilaterally. Neck: There are no carotid bruits noted bilaterally.  NEUROLOGICAL:  Orientation:  The patient is alert and oriented x 3.  Recent and remote memory are intact.  Attention span and concentration are normal.  Able to name objects and repeat without trouble.  Fund of knowledge is appropriate Cranial nerves: There is good facial symmetry. The pupils are equal round and reactive to light bilaterally. Fundoscopic exam reveals clear disc margins bilaterally. Extraocular muscles are intact and visual fields are full to confrontational testing. Speech is fluent and clear. Soft palate rises symmetrically and there is no tongue deviation. Hearing is intact to conversational tone. Tone: Tone is good throughout. Sensation: Sensation is intact to light touch throughout Motor: Strength is 5/5 in the bilateral upper and lower extremities.  Shoulder shrug is equal bilaterally.  There is no pronator drift.  There are no fasciculations noted. DTR's: Deep tendon reflexes are 2/4 at the bilateral biceps, triceps, brachioradialis, and patella.  Plantar responses are downgoing bilaterally. Gait and Station: The patient is able to ambulate without difficulty.  LABS:  Lab Results  Component Value Date   WBC 10.6* 08/06/2011   HGB 13.9 03/27/2013   HCT 41.0 03/27/2013   MCV 79.0 08/06/2011   PLT 158 08/06/2011     Chemistry      Component Value Date/Time   NA 143 03/27/2013 1012   NA 139 11/30/2011   K 4.2 03/27/2013 1012   CL 104 03/27/2013 1012   CO2 21 08/06/2011 0457   BUN 15 03/27/2013 1012   BUN 16 11/30/2011   CREATININE 1.20 03/27/2013 1012   CREATININE 1.1 11/30/2011   GLU 96 11/30/2011      Component Value Date/Time    CALCIUM 8.9 08/06/2011 0457   ALKPHOS 54 08/06/2011 0457   AST 14 11/30/2011   ALT 11 11/30/2011   BILITOT 0.5 08/06/2011 0457       Impression/Recommendations:   1.  Nocturnal convulsive events - possibly frontal lobe seizures   - continue Keppra 750mg  bid.  -The patient and I did discuss seizure and safety.  We talked about the importance of proper sleep among other sz/safety  items.  -If he is doing well and is seizure free, I will see him back in 1 yr.  If he needs me before then, he certainly can call.  Will try to get copy of labs from PCP in the meantime (had them done 2 weeks ago).

## 2015-01-21 DIAGNOSIS — N401 Enlarged prostate with lower urinary tract symptoms: Secondary | ICD-10-CM | POA: Diagnosis not present

## 2015-01-21 DIAGNOSIS — N138 Other obstructive and reflux uropathy: Secondary | ICD-10-CM | POA: Diagnosis not present

## 2015-01-21 DIAGNOSIS — R338 Other retention of urine: Secondary | ICD-10-CM | POA: Diagnosis not present

## 2015-01-21 DIAGNOSIS — C61 Malignant neoplasm of prostate: Secondary | ICD-10-CM | POA: Diagnosis not present

## 2015-01-21 DIAGNOSIS — R972 Elevated prostate specific antigen [PSA]: Secondary | ICD-10-CM | POA: Diagnosis not present

## 2015-01-21 MED FILL — TAMSULOSIN HCL 0.4 MG CAP: 0.4 | 30 days supply | Qty: 30 | Fill #0

## 2015-01-28 DIAGNOSIS — G4730 Sleep apnea, unspecified: Secondary | ICD-10-CM | POA: Diagnosis not present

## 2015-01-28 DIAGNOSIS — E559 Vitamin D deficiency, unspecified: Secondary | ICD-10-CM | POA: Diagnosis not present

## 2015-01-28 DIAGNOSIS — N400 Enlarged prostate without lower urinary tract symptoms: Secondary | ICD-10-CM | POA: Diagnosis not present

## 2015-01-28 DIAGNOSIS — I10 Essential (primary) hypertension: Secondary | ICD-10-CM | POA: Diagnosis not present

## 2015-01-28 MED FILL — DOXAZOSIN MESYLATE 8 MG TAB: 8 | 90 days supply | Qty: 90 | Fill #1

## 2015-02-04 DIAGNOSIS — G4733 Obstructive sleep apnea (adult) (pediatric): Secondary | ICD-10-CM | POA: Diagnosis not present

## 2015-02-04 MED FILL — levETIRAcetam 750 MG TABS: 750 | 90 days supply | Qty: 180 | Fill #1

## 2015-02-17 MED FILL — METOPROLOL SUCC ER 100 MG T: 100 | 90 days supply | Qty: 90 | Fill #1

## 2015-02-26 DIAGNOSIS — G4733 Obstructive sleep apnea (adult) (pediatric): Secondary | ICD-10-CM | POA: Diagnosis not present

## 2015-03-07 DIAGNOSIS — G4733 Obstructive sleep apnea (adult) (pediatric): Secondary | ICD-10-CM | POA: Diagnosis not present

## 2015-03-18 MED FILL — BENAZEPRIL HCL 40 MG TABLET: 40 | 90 days supply | Qty: 90 | Fill #1

## 2015-03-18 MED FILL — AMLODIPINE BESYLATE 10 MG T: 10 | 90 days supply | Qty: 90 | Fill #3

## 2015-04-04 DIAGNOSIS — G4733 Obstructive sleep apnea (adult) (pediatric): Secondary | ICD-10-CM | POA: Diagnosis not present

## 2015-04-23 MED FILL — DYMISTA NASAL SPRAY: 137-50 | 30 days supply | Qty: 23 | Fill #1

## 2015-04-23 MED FILL — VIAGRA 100 MG TABLET: 100 | 30 days supply | Qty: 6 | Fill #1

## 2015-05-05 DIAGNOSIS — G4733 Obstructive sleep apnea (adult) (pediatric): Secondary | ICD-10-CM | POA: Diagnosis not present

## 2015-05-07 MED FILL — levETIRAcetam 750 MG TABS: 750 | 90 days supply | Qty: 180 | Fill #2

## 2015-05-12 MED FILL — DOXAZOSIN MESYLATE 8 MG TAB: 8 | 90 days supply | Qty: 90 | Fill #0

## 2015-05-15 MED FILL — METOPROLOL SUCC ER 100 MG T: 100 | 90 days supply | Qty: 90 | Fill #2

## 2015-05-30 MED FILL — DICLOFENAC SODIUM 1% GEL: 1 | 25 days supply | Qty: 100 | Fill #0

## 2015-06-04 DIAGNOSIS — G4733 Obstructive sleep apnea (adult) (pediatric): Secondary | ICD-10-CM | POA: Diagnosis not present

## 2015-06-05 DIAGNOSIS — E8881 Metabolic syndrome: Secondary | ICD-10-CM | POA: Diagnosis not present

## 2015-06-05 DIAGNOSIS — E559 Vitamin D deficiency, unspecified: Secondary | ICD-10-CM | POA: Diagnosis not present

## 2015-06-05 DIAGNOSIS — G5601 Carpal tunnel syndrome, right upper limb: Secondary | ICD-10-CM | POA: Diagnosis not present

## 2015-06-05 DIAGNOSIS — I10 Essential (primary) hypertension: Secondary | ICD-10-CM | POA: Diagnosis not present

## 2015-06-17 MED FILL — BENAZEPRIL HCL 40 MG TABLET: 40 | 90 days supply | Qty: 90 | Fill #2

## 2015-06-19 MED FILL — AMLODIPINE BESYLATE 10 MG T: 10 | 90 days supply | Qty: 90 | Fill #0

## 2015-07-03 MED FILL — NAPROXEN 500 MG TABLET: 500 | 30 days supply | Qty: 60 | Fill #1

## 2015-07-03 MED FILL — SPIRONOLACTONE 25 MG TABLET: 25 | 90 days supply | Qty: 90 | Fill #1

## 2015-07-07 DIAGNOSIS — I10 Essential (primary) hypertension: Secondary | ICD-10-CM | POA: Diagnosis not present

## 2015-07-07 DIAGNOSIS — G40909 Epilepsy, unspecified, not intractable, without status epilepticus: Secondary | ICD-10-CM | POA: Diagnosis not present

## 2015-07-07 DIAGNOSIS — E8881 Metabolic syndrome: Secondary | ICD-10-CM | POA: Diagnosis not present

## 2015-07-07 DIAGNOSIS — E559 Vitamin D deficiency, unspecified: Secondary | ICD-10-CM | POA: Diagnosis not present

## 2015-07-07 DIAGNOSIS — G4730 Sleep apnea, unspecified: Secondary | ICD-10-CM | POA: Diagnosis not present

## 2015-07-07 MED FILL — VIT D2 1.25 MG (50,000 UNIT: 1.25 MG | 84 days supply | Qty: 12 | Fill #0

## 2015-07-07 MED FILL — CEFDINIR 300 MG CAPSULE: 300 | 7 days supply | Qty: 14 | Fill #0

## 2015-07-21 DIAGNOSIS — C61 Malignant neoplasm of prostate: Secondary | ICD-10-CM | POA: Diagnosis not present

## 2015-07-21 DIAGNOSIS — N401 Enlarged prostate with lower urinary tract symptoms: Secondary | ICD-10-CM | POA: Diagnosis not present

## 2015-07-21 DIAGNOSIS — R350 Frequency of micturition: Secondary | ICD-10-CM | POA: Diagnosis not present

## 2015-07-21 DIAGNOSIS — R339 Retention of urine, unspecified: Secondary | ICD-10-CM | POA: Diagnosis not present

## 2015-07-21 MED FILL — DOXAZOSIN MESYLATE 8 MG TAB: 8 | 90 days supply | Qty: 180 | Fill #0

## 2015-07-25 MED FILL — DOXYCYCLINE HYCLATE 100 MG: 100 | 10 days supply | Qty: 20 | Fill #0

## 2015-08-11 MED FILL — levETIRAcetam 750 MG TABS: 750 | 90 days supply | Qty: 180 | Fill #3

## 2015-08-18 MED FILL — METOPROLOL SUCC ER 100 MG T: 100 | 90 days supply | Qty: 90 | Fill #3

## 2015-08-19 MED FILL — CEFDINIR 300 MG CAPSULE: 300 | 7 days supply | Qty: 14 | Fill #0

## 2015-08-22 DIAGNOSIS — G4733 Obstructive sleep apnea (adult) (pediatric): Secondary | ICD-10-CM | POA: Diagnosis not present

## 2015-09-12 MED FILL — AMLODIPINE BESYLATE 10 MG T: 10 | 90 days supply | Qty: 90 | Fill #1

## 2015-09-16 MED FILL — BENAZEPRIL HCL 40 MG TABLET: 40 | 90 days supply | Qty: 90 | Fill #0

## 2015-09-26 MED FILL — NAPROXEN 500 MG TABLET: 500 | 30 days supply | Qty: 60 | Fill #2

## 2015-11-03 ENCOUNTER — Other Ambulatory Visit: Payer: Self-pay | Admitting: Neurology

## 2015-11-03 MED FILL — levETIRAcetam 750 MG TABS: 750 | 90 days supply | Qty: 180 | Fill #0

## 2015-11-06 DIAGNOSIS — J309 Allergic rhinitis, unspecified: Secondary | ICD-10-CM | POA: Diagnosis not present

## 2015-11-06 DIAGNOSIS — I10 Essential (primary) hypertension: Secondary | ICD-10-CM | POA: Diagnosis not present

## 2015-11-06 DIAGNOSIS — M25569 Pain in unspecified knee: Secondary | ICD-10-CM | POA: Diagnosis not present

## 2015-11-06 DIAGNOSIS — H6690 Otitis media, unspecified, unspecified ear: Secondary | ICD-10-CM | POA: Diagnosis not present

## 2015-11-06 MED FILL — AZITHROMYCIN 500 MG TABLET: 500 | 5 days supply | Qty: 5 | Fill #0

## 2015-11-10 DIAGNOSIS — M1712 Unilateral primary osteoarthritis, left knee: Secondary | ICD-10-CM | POA: Diagnosis not present

## 2015-11-10 MED FILL — METOPROLOL SUCC ER 100 MG T: 100 | 90 days supply | Qty: 90 | Fill #0

## 2015-11-10 NOTE — Progress Notes (Signed)
Dear Dr. Marlou Sa,   Thank you for having me see Joel Duarte in followup today at [facility] for his problem with 3 nocturnal convulsive events.  I reviewed Dr. Jodi Mourning previous notes.  I have updated the patient's history and physical.    As you may recall, he is a 70 y.o. year old male with a history of hypertension who had his first convulsive event about 2 years ago. It occurred at around 7 a.m. and was described as bilateral shaking, with eyes rolling back and then "arm flailing". He had tongue biting and urinary incontinence. He had a similar event that occurred around 6 a.m. in May 2013 but was confused for much longer afterwards as he was given Ativan 4mg  in the E.D for agitation. An MRI at that time was unremarkable except for mild small vessel disease. Routine EEG read by myself was also unremarkable. At that time he elected not to treat with an AED.   Finally, he had one in July, 2013. He was started on Dilantin 100 tid. This occurred at 3:50 a.m. on Friday and was only 1-2 minutes in length. There was no arm flailing. Patient returned to normal soon after the EMS arrived. He was finding the Dilantin was giving him a headache and ultimately it was changed to [name].  He has been seizure-free since last visit in September.  The patient has never had similar events during the day.  There is no family history of seizure.  He is currently on Keppra, 750 mg twice a day.  He denies any side effects with the Keppra.  He is fairly compliant with the medication.  He does admit to sometimes not getting enough sleep.   He admits to crazy dreams and acting out the dreams.    09/27/13 update:  Pt has a hx of frontal lobe seizure.  On keppra 750 mg bid.  Seizure free since 09/2011.  Doing well.  No SE.  No new medical problems.    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  Remains on keppra 750 mg bid.  Doing well.  No SE.  No seizures.  No new medical issues.  Seizure free since  2013.  States updated on labs via PCP.  Just had labs 2 weeks ago.    11/12/15 update:  Pt returns for his yearly f/u.  Doing well on keppra 750 mg bid.  No SE.  No seizures.  Seizure free since 2013.  No aura.  No new medical problems.    No Known Allergies   Current Outpatient Prescriptions on File Prior to Visit  Medication Sig Dispense Refill  . amLODipine (NORVASC) 10 MG tablet Take 10 mg by mouth daily.    . benazepril (LOTENSIN) 40 MG tablet Take 40 mg by mouth daily.    Marland Kitchen doxazosin (CARDURA) 8 MG tablet Take 8 mg by mouth at bedtime.    . levETIRAcetam (KEPPRA) 750 MG tablet TAKE 1 TABLET (750 MG TOTAL) BY MOUTH 2 (TWO) TIMES DAILY. 180 tablet 0  . metoprolol succinate (TOPROL-XL) 100 MG 24 hr tablet Take 100 mg by mouth daily. Take with or immediately following a meal.    . naproxen (NAPROSYN) 500 MG tablet Take 500 mg by mouth daily as needed.    Marland Kitchen spironolactone (ALDACTONE) 25 MG tablet Take 25 mg by mouth daily.     No current facility-administered medications on file prior to visit.    Past Medical History:  Diagnosis Date  .  Allergy   . Enlarged prostate   . Hypertension   . Osteoarthrosis, unspecified whether generalized or localized, unspecified site   . Seizures ([place])    history of 1 a year ago   Past Surgical History:  Procedure Laterality Date  . no surgical h/o  June 2014   Social History   Social History  . Marital status: Married    Spouse name: N/A  . Number of children: N/A  . 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 Smoking status: Never Smoker  . Smokeless tobacco: Never Used  . Alcohol use No  . Drug use: No  . Sexual activity: Not on file   Other Topics Concern  . Not on file   Social History Narrative  . No narrative on file   Family Status  Relation Status  . Mother Alive   HTN  . Father Deceased   unknown cause  . Sister Alive   healthy  . Child Alive    2, healthy     ROS:  13 systems were reviewed and  are unremarkable.  Exam: . There were no vitals filed for this visit. Gen:  Appears stated age and in NAD. HEENT:  Normocephalic, atraumatic. The mucous membranes are moist. The superficial temporal arteries are without ropiness or tenderness. Cardiovascular: Regular rate and rhythm with 2-3/6 SEM. Lungs: Clear to auscultation bilaterally. Neck: There are no carotid bruits noted bilaterally.  NEUROLOGICAL:  Orientation:  The patient is alert and oriented x 3.  Recent and remote memory are intact.  Attention span and concentration are normal.  Able to name objects and repeat without trouble.  Fund of knowledge is appropriate Cranial nerves: There is good facial symmetry. The pupils are equal round and reactive to light bilaterally. Fundoscopic exam reveals clear disc margins bilaterally. Extraocular muscles are intact and visual fields are full to confrontational testing. Speech is fluent and clear. Soft palate rises symmetrically and there is no tongue deviation. Hearing is intact to conversational tone. Tone: Tone is good throughout. Sensation: Sensation is intact to light touch throughout Motor: Strength is 5/5 in the bilateral upper and lower extremities.  Shoulder shrug is equal bilaterally.  There is no pronator drift.  There are no fasciculations noted. DTR's: Deep tendon reflexes are 2/4 at the bilateral biceps, triceps, brachioradialis, and patella.  Plantar responses are downgoing bilaterally. Gait and Station: The patient is able to ambulate without difficulty.  LABS:  I called his primary care physician and was able to get a copy of labs from 06/05/2015.  His sodium was 143, potassium 3.9, chloride 108, CO2 25, BUN 17 and creatinine 1.04.  His glucose was 103.  AST 14 ALT 7.  His TSH was 1.71.  Hemoglobin A1c 6.1.  Impression/Recommendations:   1.  Nocturnal convulsive events - possibly frontal lobe seizures   - continue  Keppra 750mg  bid.  -The patient and I did discuss seizure and safety.  We talked about the importance of proper sleep among other sz/safety items.  -If he is doing well and is seizure free, I will see him back in 1 yr.  If he needs me before then, he certainly can call.

## 2015-11-12 ENCOUNTER — Encounter: Payer: Self-pay | Admitting: Neurology

## 2015-11-12 ENCOUNTER — Ambulatory Visit (INDEPENDENT_AMBULATORY_CARE_PROVIDER_SITE_OTHER): Payer: 59 | Admitting: Neurology

## 2015-11-12 VITALS — BP 160/80 | HR 78 | Ht 74.0 in | Wt 217.2 lb

## 2015-11-12 DIAGNOSIS — G40109 Localization-related (focal) (partial) symptomatic epilepsy and epileptic syndromes with simple partial seizures, not intractable, without status epilepticus: Secondary | ICD-10-CM | POA: Diagnosis not present

## 2015-11-12 MED ORDER — LEVETIRACETAM 750 MG PO TABS: 750.0000 mg | ORAL_TABLET | Freq: Two times a day (BID) | ORAL | 3 refills | Status: DC

## 2015-12-10 MED FILL — BENAZEPRIL HCL 40 MG TABLET: 40 | 90 days supply | Qty: 90 | Fill #1

## 2015-12-10 MED FILL — VIAGRA 100 MG TABLET: 100 | 30 days supply | Qty: 6 | Fill #2

## 2015-12-10 MED FILL — AMLODIPINE BESYLATE 10 MG T: 10 | 90 days supply | Qty: 90 | Fill #2

## 2015-12-22 DIAGNOSIS — M1712 Unilateral primary osteoarthritis, left knee: Secondary | ICD-10-CM | POA: Diagnosis not present

## 2016-01-02 MED FILL — NAPROXEN 500 MG TABLET: 500 | 30 days supply | Qty: 60 | Fill #0

## 2016-01-02 MED FILL — DOXAZOSIN MESYLATE 8 MG TAB: 8 | 90 days supply | Qty: 180 | Fill #1

## 2016-01-07 DIAGNOSIS — E559 Vitamin D deficiency, unspecified: Secondary | ICD-10-CM | POA: Diagnosis not present

## 2016-01-07 DIAGNOSIS — E8881 Metabolic syndrome: Secondary | ICD-10-CM | POA: Diagnosis not present

## 2016-01-07 DIAGNOSIS — E119 Type 2 diabetes mellitus without complications: Secondary | ICD-10-CM | POA: Diagnosis not present

## 2016-01-07 DIAGNOSIS — I10 Essential (primary) hypertension: Secondary | ICD-10-CM | POA: Diagnosis not present

## 2016-01-22 DIAGNOSIS — J309 Allergic rhinitis, unspecified: Secondary | ICD-10-CM | POA: Diagnosis not present

## 2016-01-22 DIAGNOSIS — G4730 Sleep apnea, unspecified: Secondary | ICD-10-CM | POA: Diagnosis not present

## 2016-01-22 DIAGNOSIS — E8881 Metabolic syndrome: Secondary | ICD-10-CM | POA: Diagnosis not present

## 2016-01-22 DIAGNOSIS — I10 Essential (primary) hypertension: Secondary | ICD-10-CM | POA: Diagnosis not present

## 2016-02-02 MED FILL — levETIRAcetam 750 MG TABS: 750 | 90 days supply | Qty: 180 | Fill #0

## 2016-02-02 MED FILL — METOPROLOL SUCC ER 100 MG T: 100 | 90 days supply | Qty: 90 | Fill #1

## 2016-02-12 MED FILL — AZITHROMYCIN 500 MG TABLET: 500 | 5 days supply | Qty: 5 | Fill #0

## 2016-02-26 MED FILL — DOXYCYCLINE HYCLATE 100 MG: 100 | 10 days supply | Qty: 20 | Fill #0

## 2016-03-03 MED FILL — DYMISTA NASAL SPRAY: 137-50 | 30 days supply | Qty: 23 | Fill #0

## 2016-03-08 MED FILL — VIT D2 1.25 MG (50,000 UNIT: 1.25 MG | 84 days supply | Qty: 12 | Fill #1

## 2016-03-10 MED FILL — BENAZEPRIL HCL 40 MG TABLET: 40 | 90 days supply | Qty: 90 | Fill #2

## 2016-03-10 MED FILL — AMLODIPINE BESYLATE 10 MG T: 10 | 90 days supply | Qty: 90 | Fill #3

## 2016-03-12 MED FILL — AZITHROMYCIN 500 MG TABLET: 500 | 5 days supply | Qty: 5 | Fill #0

## 2016-03-26 DIAGNOSIS — G4733 Obstructive sleep apnea (adult) (pediatric): Secondary | ICD-10-CM | POA: Diagnosis not present

## 2016-04-20 DIAGNOSIS — J309 Allergic rhinitis, unspecified: Secondary | ICD-10-CM | POA: Diagnosis not present

## 2016-04-20 DIAGNOSIS — I10 Essential (primary) hypertension: Secondary | ICD-10-CM | POA: Diagnosis not present

## 2016-04-20 DIAGNOSIS — G4730 Sleep apnea, unspecified: Secondary | ICD-10-CM | POA: Diagnosis not present

## 2016-04-20 DIAGNOSIS — L309 Dermatitis, unspecified: Secondary | ICD-10-CM | POA: Diagnosis not present

## 2016-05-04 MED FILL — levETIRAcetam 750 MG TABS: 750 | 90 days supply | Qty: 180 | Fill #1

## 2016-05-11 MED FILL — METOPROLOL SUCC ER 100 MG T: 100 | 90 days supply | Qty: 90 | Fill #2

## 2016-06-02 DIAGNOSIS — I10 Essential (primary) hypertension: Secondary | ICD-10-CM | POA: Diagnosis not present

## 2016-06-02 DIAGNOSIS — E559 Vitamin D deficiency, unspecified: Secondary | ICD-10-CM | POA: Diagnosis not present

## 2016-06-02 DIAGNOSIS — L309 Dermatitis, unspecified: Secondary | ICD-10-CM | POA: Diagnosis not present

## 2016-06-03 DIAGNOSIS — H2511 Age-related nuclear cataract, right eye: Secondary | ICD-10-CM | POA: Diagnosis not present

## 2016-06-03 DIAGNOSIS — H25812 Combined forms of age-related cataract, left eye: Secondary | ICD-10-CM | POA: Diagnosis not present

## 2016-06-09 MED FILL — BENAZEPRIL HCL 40 MG TABLET: 40 | 90 days supply | Qty: 90 | Fill #0

## 2016-06-09 MED FILL — AMLODIPINE BESYLATE 10 MG T: 10 | 90 days supply | Qty: 90 | Fill #0

## 2016-07-06 DIAGNOSIS — E8881 Metabolic syndrome: Secondary | ICD-10-CM | POA: Diagnosis not present

## 2016-07-06 DIAGNOSIS — M1990 Unspecified osteoarthritis, unspecified site: Secondary | ICD-10-CM | POA: Diagnosis not present

## 2016-07-06 DIAGNOSIS — I10 Essential (primary) hypertension: Secondary | ICD-10-CM | POA: Diagnosis not present

## 2016-07-06 DIAGNOSIS — J309 Allergic rhinitis, unspecified: Secondary | ICD-10-CM | POA: Diagnosis not present

## 2016-07-06 MED FILL — MELOXICAM 7.5 MG TABLET: 7.5 | 30 days supply | Qty: 30 | Fill #0

## 2016-07-20 MED FILL — VIT D2 1.25 MG (50,000 UNIT: 1.25 MG | 84 days supply | Qty: 12 | Fill #0

## 2016-08-06 MED FILL — METOPROLOL SUCC ER 100 MG T: 100 | 90 days supply | Qty: 90 | Fill #3

## 2016-08-09 MED FILL — levETIRAcetam 750 MG TABS: 750 | 90 days supply | Qty: 180 | Fill #2

## 2016-08-09 MED FILL — SILDENAFIL CITRATE 100 MG T: 100 | 90 days supply | Qty: 18 | Fill #0

## 2016-08-11 MED FILL — AZITHROMYCIN 500 MG TABLET: 500 | 5 days supply | Qty: 5 | Fill #0

## 2016-08-20 MED FILL — DOXAZOSIN MESYLATE 8 MG TAB: 8 | 30 days supply | Qty: 60 | Fill #0

## 2016-08-23 MED FILL — AMLODIPINE BESYLATE 10 MG T: 10 | 90 days supply | Qty: 90 | Fill #1

## 2016-09-01 MED FILL — BENAZEPRIL HCL 40 MG TABLET: 40 | 90 days supply | Qty: 90 | Fill #1

## 2016-09-06 MED FILL — NAPROXEN 500 MG TABLET: 500 | 30 days supply | Qty: 60 | Fill #1

## 2016-09-14 DIAGNOSIS — E559 Vitamin D deficiency, unspecified: Secondary | ICD-10-CM | POA: Diagnosis not present

## 2016-09-14 DIAGNOSIS — G4730 Sleep apnea, unspecified: Secondary | ICD-10-CM | POA: Diagnosis not present

## 2016-09-14 DIAGNOSIS — I10 Essential (primary) hypertension: Secondary | ICD-10-CM | POA: Diagnosis not present

## 2016-09-14 DIAGNOSIS — J309 Allergic rhinitis, unspecified: Secondary | ICD-10-CM | POA: Diagnosis not present

## 2016-10-12 DIAGNOSIS — C61 Malignant neoplasm of prostate: Secondary | ICD-10-CM | POA: Diagnosis not present

## 2016-10-14 DIAGNOSIS — J309 Allergic rhinitis, unspecified: Secondary | ICD-10-CM | POA: Diagnosis not present

## 2016-10-14 DIAGNOSIS — R7303 Prediabetes: Secondary | ICD-10-CM | POA: Diagnosis not present

## 2016-10-14 DIAGNOSIS — J101 Influenza due to other identified influenza virus with other respiratory manifestations: Secondary | ICD-10-CM | POA: Diagnosis not present

## 2016-10-15 MED FILL — AZITHROMYCIN 500 MG TABLET: 500 | 5 days supply | Qty: 5 | Fill #0

## 2016-10-19 DIAGNOSIS — C61 Malignant neoplasm of prostate: Secondary | ICD-10-CM | POA: Diagnosis not present

## 2016-10-19 DIAGNOSIS — N401 Enlarged prostate with lower urinary tract symptoms: Secondary | ICD-10-CM | POA: Diagnosis not present

## 2016-10-19 DIAGNOSIS — R351 Nocturia: Secondary | ICD-10-CM | POA: Diagnosis not present

## 2016-10-19 MED FILL — ALFUZOSIN HCL ER 10 MG TAB: 10 | 30 days supply | Qty: 30 | Fill #0

## 2016-11-08 MED FILL — VIT D2 1.25 MG (50,000 UNIT: 1.25 MG | 84 days supply | Qty: 12 | Fill #1

## 2016-11-08 MED FILL — DOXAZOSIN MESYLATE 8 MG TAB: 8 | 30 days supply | Qty: 60 | Fill #1

## 2016-11-09 MED FILL — METOPROLOL SUCC ER 100 MG T: 100 | 90 days supply | Qty: 90 | Fill #0

## 2016-11-09 NOTE — Progress Notes (Addendum)
Dear Dr. Marlou Sa,   Thank you for having me see Joel Duarte in followup today at [facility] for his problem with 3 nocturnal convulsive events.  I reviewed Dr. Jodi Mourning previous notes.  I have updated the patient's history and physical.    As you may recall, he is a 71 y.o. year old male with a history of hypertension who had his first convulsive event about 2 years ago. It occurred at around 7 a.m. and was described as bilateral shaking, with eyes rolling back and then "arm flailing". He had tongue biting and urinary incontinence. He had a similar event that occurred around 6 a.m. in May 2013 but was confused for much longer afterwards as he was given Ativan 4mg  in the E.D for agitation. An MRI at that time was unremarkable except for mild small vessel disease. Routine EEG read by myself was also unremarkable. At that time he elected not to treat with an AED.   Finally, he had one in July, 2013. He was started on Dilantin 100 tid. This occurred at 3:50 a.m. on Friday and was only 1-2 minutes in length. There was no arm flailing. Patient returned to normal soon after the EMS arrived. He was finding the Dilantin was giving him a headache and ultimately it was changed to [place].  He has been seizure-free since last visit in September.  The patient has never had similar events during the day.  There is no family history of seizure.  He is currently on Keppra, 750 mg twice a day.  He denies any side effects with the Keppra.  He is fairly compliant with the medication.  He does admit to sometimes not getting enough sleep.   He admits to crazy dreams and acting out the dreams.    09/27/13 update:  Pt has a hx of frontal lobe seizure.  On keppra 750 mg bid.  Seizure free since 09/2011.  Doing well.  No SE.  No new medical problems.    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  Remains on keppra 750 mg bid.  Doing well.  No SE.  No seizures.  No new medical issues.  Seizure free since  2013.  States updated on labs via PCP.  Just had labs 2 weeks ago.    11/12/15 update:  Pt returns for his yearly f/u.  Doing well on keppra 750 mg bid.  No SE.  No seizures.  Seizure free since 2013.  No aura.  No new medical problems.    11/11/16 update: Patient seen today for follow-up regarding frontal lobe epilepsy.  Patient has had no seizures since our last visit.  He has had no side effects with Keppra, 750 mg twice per day.  He takes this faithfully.  His last seizure was in 2013.  No Known Allergies   Current Outpatient Prescriptions on File Prior to Visit  Medication Sig Dispense Refill  . amLODipine (NORVASC) 10 MG tablet Take 10 mg by mouth daily.    . benazepril (LOTENSIN) 40 MG tablet Take 40 mg by mouth daily.    Marland Kitchen doxazosin (CARDURA) 8 MG tablet Take 8 mg by mouth at bedtime.    . levETIRAcetam (KEPPRA) 750 MG tablet Take 1 tablet (750 mg total) by mouth 2 (two) times daily. 180 tablet 3  . metoprolol succinate (TOPROL-XL) 100 MG 24 hr tablet Take 100 mg by mouth daily. Take with or immediately following a meal.    . naproxen (NAPROSYN) 500  MG tablet Take 500 mg by mouth daily as needed.     No current facility-administered medications on file prior to visit.    Past Medical History:  Diagnosis Date  . Allergy   . Enlarged prostate   . Hypertension   . Osteoarthrosis, unspecified whether generalized or localized, unspecified site   . Seizures ([place])    history of 1 a year ago   Past Surgical History:  Procedure Laterality Date  . no surgical h/o  June 2014   Social History   Social History  . Marital status: Married    Spouse name: N/A  . Number of children: N/A  . 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 Smoking status: Never Smoker  . Smokeless tobacco: Never Used  . Alcohol use No  . Drug use: No  . Sexual activity: Not on file   Other Topics Concern  . Not on file   Social  History Narrative  . No narrative on file   Family Status  Relation Status  . Mother Alive       HTN  . Father Deceased       unknown cause  . Sister Alive       healthy  . Child Alive       2, healthy     ROS:  13 systems were reviewed and  are unremarkable.  Exam: . Vitals:   11/11/16 0912  BP: 134/80  Pulse: 60  SpO2: 98%  Weight: 221 lb (100.2 kg)  Height: 6\' 2"  (1.88 m)   Gen:  Appears stated age and in NAD. HEENT:  Normocephalic, atraumatic. The mucous membranes are moist. The superficial temporal arteries are without ropiness or tenderness. Cardiovascular: Regular rate and rhythm with 2-3/6 SEM. Lungs: Clear to auscultation bilaterally. Neck: There are no carotid bruits noted bilaterally.  NEUROLOGICAL:  Orientation:  The patient is alert and oriented x 3.  Recent and remote memory are intact.  Attention span and concentration are normal.  Able to name objects and repeat without trouble.  Fund of knowledge is appropriate Cranial nerves: There is good facial symmetry.    Speech is fluent and clear. Soft palate rises symmetrically and there is no tongue deviation. Hearing is intact to conversational tone. Tone: Tone is good throughout. Sensation: Sensation is intact to light touch throughout Motor: Strength is 5/5 in the bilateral upper and lower extremities.  Shoulder shrug is equal bilaterally.  There is no pronator drift.  There are no fasciculations noted. Gait and Station: The patient is able to ambulate without difficulty.  LABS:  Addendum labs dated October 14, 2016 were received.  White blood cells were 5.2, hemoglobin 12.4, hematocrit 37.7 and platelets 173.  Sodium was 145, potassium 4.5, chloride 108, CO2 24, BUN 20, creatinine 1.16, AST 14, ALT 10, hemoglobin A1c 6.1  Impression/Recommendations:   1.  Nocturnal convulsive events - possibly frontal lobe seizures   -pt reports that he had labs a month ago and will try to get labs.  Last eye exam in  may  -refilled keppra 750 mg bid.  Risks, benefits, side effects and alternative therapies were discussed.  The opportunity to ask questions was given and they were answered to the best of my ability.  The patient expressed understanding and willingness to follow the outlined treatment protocols.  2.  F/u in one year.

## 2016-11-11 ENCOUNTER — Encounter: Payer: Self-pay | Admitting: Neurology

## 2016-11-11 ENCOUNTER — Ambulatory Visit (INDEPENDENT_AMBULATORY_CARE_PROVIDER_SITE_OTHER): Payer: 59 | Admitting: Neurology

## 2016-11-11 VITALS — BP 134/80 | HR 60 | Ht 74.0 in | Wt 221.0 lb

## 2016-11-11 DIAGNOSIS — G40109 Localization-related (focal) (partial) symptomatic epilepsy and epileptic syndromes with simple partial seizures, not intractable, without status epilepticus: Secondary | ICD-10-CM | POA: Diagnosis not present

## 2016-11-11 MED ORDER — LEVETIRACETAM 750 MG PO TABS: 750.0000 mg | ORAL_TABLET | Freq: Two times a day (BID) | ORAL | 3 refills | Status: DC

## 2016-11-11 MED FILL — levETIRAcetam 750 MG TABS: 750 | 90 days supply | Qty: 180 | Fill #0

## 2016-12-06 MED FILL — AMLODIPINE BESYLATE 10 MG T: 10 | 90 days supply | Qty: 90 | Fill #2

## 2016-12-06 MED FILL — BENAZEPRIL HCL 40 MG TABLET: 40 | 90 days supply | Qty: 90 | Fill #2

## 2017-01-12 MED FILL — DOXAZOSIN MESYLATE 8 MG TAB: 8 | 90 days supply | Qty: 90 | Fill #0

## 2017-01-13 MED FILL — AZITHROMYCIN 500 MG TABLET: 500 | 5 days supply | Qty: 5 | Fill #0

## 2017-01-31 MED FILL — METOPROLOL SUCC ER 100 MG T: 100 | 90 days supply | Qty: 90 | Fill #1

## 2017-02-07 MED FILL — levETIRAcetam 750 MG TABS: 750 | 90 days supply | Qty: 180 | Fill #1

## 2017-02-10 MED FILL — NAPROXEN 500 MG TABLET: 500 | 30 days supply | Qty: 60 | Fill #0

## 2017-02-14 DIAGNOSIS — G4733 Obstructive sleep apnea (adult) (pediatric): Secondary | ICD-10-CM | POA: Diagnosis not present

## 2017-03-01 MED FILL — AMLODIPINE BESYLATE 10 MG T: 10 | 90 days supply | Qty: 90 | Fill #3

## 2017-03-02 DIAGNOSIS — G4733 Obstructive sleep apnea (adult) (pediatric): Secondary | ICD-10-CM | POA: Diagnosis not present

## 2017-03-04 MED FILL — BENAZEPRIL HCL 40 MG TABLET: 40 | 90 days supply | Qty: 90 | Fill #3

## 2017-03-15 DIAGNOSIS — I10 Essential (primary) hypertension: Secondary | ICD-10-CM | POA: Diagnosis not present

## 2017-03-15 DIAGNOSIS — J0100 Acute maxillary sinusitis, unspecified: Secondary | ICD-10-CM | POA: Diagnosis not present

## 2017-03-15 DIAGNOSIS — G4730 Sleep apnea, unspecified: Secondary | ICD-10-CM | POA: Diagnosis not present

## 2017-03-15 DIAGNOSIS — N400 Enlarged prostate without lower urinary tract symptoms: Secondary | ICD-10-CM | POA: Diagnosis not present

## 2017-03-16 MED FILL — AZITHROMYCIN 500 MG TABLET: 500 | 5 days supply | Qty: 5 | Fill #0

## 2017-03-17 DIAGNOSIS — I10 Essential (primary) hypertension: Secondary | ICD-10-CM | POA: Diagnosis not present

## 2017-03-17 DIAGNOSIS — E559 Vitamin D deficiency, unspecified: Secondary | ICD-10-CM | POA: Diagnosis not present

## 2017-03-17 DIAGNOSIS — R7301 Impaired fasting glucose: Secondary | ICD-10-CM | POA: Diagnosis not present

## 2017-03-24 ENCOUNTER — Ambulatory Visit
Admission: RE | Admit: 2017-03-24 | Discharge: 2017-03-24 | Disposition: A | Discharge status: Home / Self Care | Payer: 59 | Source: Ambulatory Visit | Attending: Internal Medicine | Admitting: Internal Medicine

## 2017-03-24 ENCOUNTER — Other Ambulatory Visit: Payer: Self-pay | Admitting: Internal Medicine

## 2017-03-24 DIAGNOSIS — M7989 Other specified soft tissue disorders: Secondary | ICD-10-CM | POA: Diagnosis not present

## 2017-03-24 DIAGNOSIS — R609 Edema, unspecified: Secondary | ICD-10-CM

## 2017-03-24 DIAGNOSIS — M25532 Pain in left wrist: Secondary | ICD-10-CM

## 2017-04-07 MED FILL — DOXAZOSIN MESYLATE 8 MG TAB: 8 | 90 days supply | Qty: 90 | Fill #1

## 2017-05-09 MED FILL — METOPROLOL SUCCINATE ER 100: 100 | 90 days supply | Qty: 90 | Fill #2

## 2017-05-17 MED FILL — levETIRAcetam 750 MG TABS: 750 | 90 days supply | Qty: 180 | Fill #2

## 2017-05-26 MED FILL — AZITHROMYCIN 500 MG TABLET: 500 | 5 days supply | Qty: 5 | Fill #0

## 2017-06-07 DIAGNOSIS — J309 Allergic rhinitis, unspecified: Secondary | ICD-10-CM | POA: Diagnosis not present

## 2017-06-07 DIAGNOSIS — E559 Vitamin D deficiency, unspecified: Secondary | ICD-10-CM | POA: Diagnosis not present

## 2017-06-07 DIAGNOSIS — M62838 Other muscle spasm: Secondary | ICD-10-CM | POA: Diagnosis not present

## 2017-06-07 DIAGNOSIS — E8881 Metabolic syndrome: Secondary | ICD-10-CM | POA: Diagnosis not present

## 2017-06-07 DIAGNOSIS — I10 Essential (primary) hypertension: Secondary | ICD-10-CM | POA: Diagnosis not present

## 2017-06-07 DIAGNOSIS — J300 Vasomotor rhinitis: Secondary | ICD-10-CM | POA: Diagnosis not present

## 2017-06-07 MED FILL — BENAZEPRIL HCL 40 MG TABLET: 40 | 90 days supply | Qty: 90 | Fill #0

## 2017-06-07 MED FILL — AZELASTINE HCL 137 MCG SPRY: 0.1 | 90 days supply | Qty: 90 | Fill #0

## 2017-06-07 MED FILL — AMLODIPINE BESYLATE 10 MG T: 10 | 90 days supply | Qty: 90 | Fill #0

## 2017-06-07 MED FILL — VIT D2 1.25 MG (50,000 UNIT: 1.25 MG | 84 days supply | Qty: 12 | Fill #0

## 2017-06-07 MED FILL — MONTELUKAST SOD 10 MG TAB: 10 | 30 days supply | Qty: 30 | Fill #0

## 2017-07-11 MED FILL — DOXAZOSIN MESYLATE 8 MG TAB: 8 | 90 days supply | Qty: 90 | Fill #2

## 2017-08-04 MED FILL — METOPROLOL SUCCINATE ER 100: 100 | 90 days supply | Qty: 90 | Fill #3

## 2017-08-16 MED FILL — levETIRAcetam 750 MG TABS: 750 | 90 days supply | Qty: 180 | Fill #3

## 2017-08-29 DIAGNOSIS — I10 Essential (primary) hypertension: Secondary | ICD-10-CM | POA: Diagnosis not present

## 2017-08-29 DIAGNOSIS — E559 Vitamin D deficiency, unspecified: Secondary | ICD-10-CM | POA: Diagnosis not present

## 2017-08-29 DIAGNOSIS — G4730 Sleep apnea, unspecified: Secondary | ICD-10-CM | POA: Diagnosis not present

## 2017-08-29 DIAGNOSIS — E8881 Metabolic syndrome: Secondary | ICD-10-CM | POA: Diagnosis not present

## 2017-08-29 MED FILL — VIT D2 1.25 MG (50,000 UNIT: 1.25 MG | 84 days supply | Qty: 12 | Fill #1

## 2017-09-02 MED FILL — AMLODIPINE BESYLATE 10 MG T: 10 | 90 days supply | Qty: 90 | Fill #1

## 2017-09-02 MED FILL — BENAZEPRIL HCL 40 MG TABLET: 40 | 90 days supply | Qty: 90 | Fill #1

## 2017-09-09 DIAGNOSIS — E785 Hyperlipidemia, unspecified: Secondary | ICD-10-CM | POA: Diagnosis not present

## 2017-09-09 DIAGNOSIS — D649 Anemia, unspecified: Secondary | ICD-10-CM | POA: Diagnosis not present

## 2017-09-09 DIAGNOSIS — R7301 Impaired fasting glucose: Secondary | ICD-10-CM | POA: Diagnosis not present

## 2017-09-09 DIAGNOSIS — E559 Vitamin D deficiency, unspecified: Secondary | ICD-10-CM | POA: Diagnosis not present

## 2017-09-09 DIAGNOSIS — I10 Essential (primary) hypertension: Secondary | ICD-10-CM | POA: Diagnosis not present

## 2017-09-29 MED FILL — DOXAZOSIN MESYLATE 8 MG TAB: 8 | 90 days supply | Qty: 90 | Fill #0

## 2017-10-18 MED FILL — NAPROXEN 500 MG TABLET: 500 | 30 days supply | Qty: 60 | Fill #1

## 2017-11-02 NOTE — Progress Notes (Signed)
Dear Dr. Marlou Duarte,   Thank you for having me see Joel Duarte in followup today at [facility] for his problem with 3 nocturnal convulsive events.  I reviewed Dr. Jodi Duarte previous notes.  I have updated the patient's history and physical.    As you may recall, he is a 72 y.o. year old male with a history of hypertension who had his first convulsive event about 2 years ago. It occurred at around 7 a.m. and was described as bilateral shaking, with eyes rolling back and then "arm flailing". He had tongue biting and urinary incontinence. He had a similar event that occurred around 6 a.m. in May 2013 but was confused for much longer afterwards as he was given Ativan 4mg  in the E.D for agitation. An MRI at that time was unremarkable except for mild small vessel disease. Routine EEG read by myself was also unremarkable. At that time he elected not to treat with an AED.   Finally, he had one in July, 2013. He was started on Dilantin 100 tid. This occurred at 3:50 a.m. on Friday and was only 1-2 minutes in length. There was no arm flailing. Patient returned to normal soon after the EMS arrived. He was finding the Dilantin was giving him a headache and ultimately it was changed to [name].  He has been seizure-free since last visit in September.  The patient has never had similar events during the day.  There is no family history of seizure.  He is currently on Keppra, 750 mg twice a day.  He denies any side effects with the Keppra.  He is fairly compliant with the medication.  He does admit to sometimes not getting enough sleep.   He admits to crazy dreams and acting out the dreams.    09/27/13 update:  Pt has a hx of frontal lobe seizure.  On keppra 750 mg bid.  Seizure free since 09/2011.  Doing well.  No SE.  No new medical problems.    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  Remains on keppra 750 mg bid.  Doing well.  No SE.  No seizures.  No new medical issues.  Seizure free since  2013.  States updated on labs via PCP.  Just had labs 2 weeks ago.    11/12/15 update:  Pt returns for his yearly f/u.  Doing well on keppra 750 mg bid.  No SE.  No seizures.  Seizure free since 2013.  No aura.  No new medical problems.    11/11/16 update: Patient seen today for follow-up regarding frontal lobe epilepsy.  Patient has had no seizures since our last visit.  He has had no side effects with Keppra, 750 mg twice per day.  He takes this faithfully.  His last seizure was in 2013.  11/04/17 update: Patient is seen today in follow-up for frontal lobe epilepsy.  Patient has been seizure-free since his last visit.  He is faithful with taking Keppra, 750 mg twice per day.  Last seizure was 2013.  No Known Allergies   Current Outpatient Medications on File Prior to Visit  Medication Sig Dispense Refill  . amLODipine (NORVASC) 10 MG tablet Take 10 mg by mouth daily.    . benazepril (LOTENSIN) 40 MG tablet Take 40 mg by mouth daily.    Marland Kitchen doxazosin (CARDURA) 8 MG tablet Take 8 mg by mouth at bedtime.    . fluticasone (FLONASE) 50 MCG/ACT nasal spray Place into both nostrils daily.    Marland Kitchen  metoprolol succinate (TOPROL-XL) 100 MG 24 hr tablet Take 100 mg by mouth daily. Take with or immediately following a meal.    . naproxen (NAPROSYN) 500 MG tablet Take 500 mg by mouth daily as needed.    . sildenafil (VIAGRA) 100 MG tablet TAKE 1 TABLET BY MOUTH 1 HOUR PRIOR TO SEXUAL ACTIVITY  2  . Vitamin D, Ergocalciferol, (DRISDOL) 50000 units CAPS capsule Take 50,000 Units by mouth once a week.  3   No current facility-administered medications on file prior to visit.    Past Medical History:  Diagnosis Date  . Allergy   . Enlarged prostate   . Hypertension   . Osteoarthrosis, unspecified whether generalized or localized, unspecified site   . Seizures ([place])    history of 1 a year ago   Past Surgical History:  Procedure Laterality Date  . no surgical h/o  June 2014   Social History    Socioeconomic History  . Marital status: Married    Spouse name: Not on file  . Number of children: Not on file  . Years of education: Not on file  . Highest education level: Not on file  Occupational History  . Occupation: Chief Executive Officer business  Social Needs  . Financial resource strain: Not on file  . Food insecurity:    Worry: Not on file    Inability: Not on file  . Transportation needs:    Medical: Not on file    Non-medical: Not on file  Tobacco Use  . Smoking status: Never Smoker  . Smokeless tobacco: Never Used  Substance and Sexual Activity  . Alcohol use: No  . Drug use: No  . Sexual activity: Not on file  Lifestyle  . Physical activity:    Days per week: Not on file    Minutes per session: Not on file  . Stress: Not on file  Relationships  . Social connections:    Talks on phone: Not on file    Gets together: Not on file    Attends religious service: Not on file    Active member of club or organization: Not on file    Attends meetings of clubs or organizations: Not on file    Relationship status: Not on file  . Intimate partner violence:    Fear of current or ex partner: Not on file    Emotionally abused: Not on file    Physically abused: Not on file    Forced sexual activity: Not on file  Other Topics Concern  . Not on file  Social History Narrative  . Not on file   Family Status  Relation Name Status  . Mother  Alive       HTN  . Father  Deceased       unknown cause  . Sister  Alive       healthy  . Child  Alive       2, healthy     ROS:  Review of Systems  Constitutional: Negative.   HENT: Negative.   Eyes: Negative.   Cardiovascular: Negative.   Gastrointestinal: Negative.   Skin: Negative.   Neurological: Negative.   Endo/Heme/Allergies: Negative.      Exam: . Vitals:   11/04/17 1320  BP: (!) 150/90  Pulse: 69  SpO2: 96%  Weight: 225 lb 8 oz (102.3 kg)  Height: 6\' 2"  (1.88 m)   Gen:  Appears stated age and in  NAD. HEENT:  Normocephalic, atraumatic. The mucous membranes are  moist. The superficial temporal arteries are without ropiness or tenderness. Cardiovascular: Regular rate and rhythm with 2-3/6 SEM. Lungs: Clear to auscultation bilaterally. Neck: There are no carotid bruits noted bilaterally.  NEUROLOGICAL:  Orientation:  The patient is alert and oriented x 3.  Recent and remote memory are intact.  Attention span and concentration are normal.  Able to name objects and repeat without trouble.  Fund of knowledge is appropriate Cranial nerves: There is good facial symmetry.   Funduscopic exam revealed clear disc margins bilaterally.  Speech is fluent and clear. Soft palate rises symmetrically and there is no tongue deviation. Hearing is intact to conversational tone. Tone: Tone is good throughout. Sensation: Sensation is intact to light touch throughout Motor: Strength is 5/5 in the bilateral upper and lower extremities.  Shoulder shrug is equal bilaterally.  There is no pronator drift.  There are no fasciculations noted. Gait and Station: The patient is able to ambulate without difficulty.  LABS:  Addendum labs dated October 14, 2016 were received.  White blood cells were 5.2, hemoglobin 12.4, hematocrit 37.7 and platelets 173.  Sodium was 145, potassium 4.5, chloride 108, CO2 24, BUN 20, creatinine 1.16, AST 14, ALT 10, hemoglobin A1c 6.1  Impression/Recommendations:   1.  Nocturnal convulsive events - possibly frontal lobe seizures   -pt reports that he is due for his physical exam and will have labs done.  We will try to get a copy.  -refilled keppra 750 mg bid.  Risks, benefits, side effects and alternative therapies were discussed.  The opportunity to ask questions was given and they were answered to the best of my ability.  The patient expressed understanding and willingness to follow the outlined treatment protocols.  2.  Follow-up 1 year.

## 2017-11-04 ENCOUNTER — Ambulatory Visit (INDEPENDENT_AMBULATORY_CARE_PROVIDER_SITE_OTHER): Payer: 59 | Admitting: Neurology

## 2017-11-04 ENCOUNTER — Encounter: Payer: Self-pay | Admitting: Neurology

## 2017-11-04 VITALS — BP 150/90 | HR 69 | Ht 74.0 in | Wt 225.5 lb

## 2017-11-04 DIAGNOSIS — G40109 Localization-related (focal) (partial) symptomatic epilepsy and epileptic syndromes with simple partial seizures, not intractable, without status epilepticus: Secondary | ICD-10-CM

## 2017-11-04 MED ORDER — LEVETIRACETAM 750 MG PO TABS: 750.0000 mg | ORAL_TABLET | Freq: Two times a day (BID) | ORAL | 3 refills | Status: DC

## 2017-11-04 MED FILL — METOPROLOL SUCCINATE ER 100: 100 | 90 days supply | Qty: 90 | Fill #0

## 2017-11-04 MED FILL — levETIRAcetam 750 MG TABS: 750 | 90 days supply | Qty: 180 | Fill #0

## 2017-11-11 ENCOUNTER — Ambulatory Visit: Payer: Medicare Other | Admitting: Neurology

## 2017-11-29 MED FILL — BENAZEPRIL HCL 40 MG TABLET: 40 | 90 days supply | Qty: 90 | Fill #2

## 2017-11-29 MED FILL — AMLODIPINE BESYLATE 10 MG T: 10 | 90 days supply | Qty: 90 | Fill #2

## 2017-12-02 MED FILL — DOXAZOSIN MESYLATE 8 MG TAB: 8 | 90 days supply | Qty: 135 | Fill #0

## 2017-12-05 DIAGNOSIS — I10 Essential (primary) hypertension: Secondary | ICD-10-CM | POA: Diagnosis not present

## 2017-12-05 DIAGNOSIS — M62838 Other muscle spasm: Secondary | ICD-10-CM | POA: Diagnosis not present

## 2017-12-05 DIAGNOSIS — E559 Vitamin D deficiency, unspecified: Secondary | ICD-10-CM | POA: Diagnosis not present

## 2017-12-05 DIAGNOSIS — J300 Vasomotor rhinitis: Secondary | ICD-10-CM | POA: Diagnosis not present

## 2017-12-05 MED FILL — CHLORTHALIDONE 25 MG TABS: 25 | 60 days supply | Qty: 30 | Fill #0

## 2017-12-05 MED FILL — FLUTICASONE PROP 50 MCG SPR: 50 | 30 days supply | Qty: 16 | Fill #0

## 2017-12-05 MED FILL — AZELASTINE HCL 137 MCG/SPRA: 137 | 50 days supply | Qty: 30 | Fill #0

## 2017-12-23 ENCOUNTER — Telehealth: Payer: Self-pay | Admitting: Neurology

## 2017-12-23 NOTE — Telephone Encounter (Signed)
Labs are received.  Labs were dated September 09, 2017.  Sodium is 144, potassium 4.3, chloride 113, CO2 22, BUN 28, creatinine was elevated at 1.81 (prior creatinine 1.91 in May, 2019), white blood cells 5.6, hemoglobin 11.6, hematocrit 37.1, platelets 187, hemoglobin A1c 6.1

## 2017-12-28 MED FILL — AZITHROMYCIN 500 MG TABLET: 500 | 5 days supply | Qty: 5 | Fill #0

## 2018-01-23 DIAGNOSIS — R252 Cramp and spasm: Secondary | ICD-10-CM | POA: Diagnosis not present

## 2018-01-23 DIAGNOSIS — I10 Essential (primary) hypertension: Secondary | ICD-10-CM | POA: Diagnosis not present

## 2018-01-23 DIAGNOSIS — R7303 Prediabetes: Secondary | ICD-10-CM | POA: Diagnosis not present

## 2018-01-24 MED FILL — AZITHROMYCIN 500 MG TABLET: 500 | 5 days supply | Qty: 5 | Fill #0

## 2018-01-24 MED FILL — DICLOFENAC SODIUM 1 % GEL: 1 | 50 days supply | Qty: 100 | Fill #0

## 2018-02-02 MED FILL — METOPROLOL SUCCINATE ER 100: 100 | 90 days supply | Qty: 90 | Fill #1

## 2018-02-10 MED FILL — levETIRAcetam 750 MG TABS: 750 | 90 days supply | Qty: 180 | Fill #1

## 2018-03-02 MED FILL — VIT D2 1.25 MG (50,000 UNIT: 1.25 MG | 84 days supply | Qty: 12 | Fill #2 | Status: TO

## 2018-03-02 MED FILL — BENAZEPRIL HCL 40 MG TABLET: 40 | 90 days supply | Qty: 90 | Fill #0

## 2018-03-02 MED FILL — AMLODIPINE BESYLATE 10 MG T: 10 | 90 days supply | Qty: 90 | Fill #3

## 2018-03-02 MED FILL — NAPROXEN 500 MG TABLET: 500 | 30 days supply | Qty: 60 | Fill #0

## 2018-03-09 MED FILL — DOXAZOSIN MESYLATE 8 MG TAB: 8 | 90 days supply | Qty: 135 | Fill #1

## 2018-03-16 MED FILL — SILDENAFIL CITRATE 100 MG T: 100 | 30 days supply | Qty: 9 | Fill #0

## 2018-03-20 MED FILL — predniSONE 10 MG TABS: 10 | 6 days supply | Qty: 21 | Fill #0

## 2018-04-19 ENCOUNTER — Other Ambulatory Visit: Payer: Self-pay | Admitting: Orthopedic Surgery

## 2018-04-19 DIAGNOSIS — M545 Low back pain, unspecified: Secondary | ICD-10-CM

## 2018-04-19 MED FILL — HYDROCODON-APAP 5-325: 5-325 | 8 days supply | Qty: 30 | Fill #0

## 2018-04-19 MED FILL — DIAZEPAM 10 MG TABS: 10 | 1 days supply | Qty: 2 | Fill #0

## 2018-05-03 MED FILL — VIT D2 1.25 MG (50,000 UNIT: 1.25 MG | 84 days supply | Qty: 12 | Fill #0 | Status: TO

## 2018-05-03 MED FILL — METOPROLOL SUCCINATE ER 100: 100 | 90 days supply | Qty: 90 | Fill #0 | Status: TO

## 2018-05-11 MED FILL — levETIRAcetam 750 MG TABS: 750 | 90 days supply | Qty: 180 | Fill #0 | Status: TO

## 2018-05-31 DIAGNOSIS — G4733 Obstructive sleep apnea (adult) (pediatric): Secondary | ICD-10-CM | POA: Diagnosis not present

## 2018-05-31 MED FILL — BENAZEPRIL HCL 40 MG TABLET: 40 | 90 days supply | Qty: 90 | Fill #0 | Status: TO

## 2018-05-31 MED FILL — AMLODIPINE BESYLATE 10 MG T: 10 | 90 days supply | Qty: 90 | Fill #0 | Status: TO

## 2018-06-09 ENCOUNTER — Other Ambulatory Visit: Payer: Medicare Other

## 2018-06-14 MED FILL — DOXAZOSIN MESYLATE 8 MG TAB: 8 | 90 days supply | Qty: 135 | Fill #2

## 2018-06-22 MED FILL — ZOLPIDEM TARTRATE 5 MG TAB: 5 | 30 days supply | Qty: 30 | Fill #0

## 2018-06-22 MED FILL — AZITHROMYCIN 500 MG TABLET: 500 | 5 days supply | Qty: 5 | Fill #1

## 2018-07-11 MED FILL — NAPROXEN 500 MG TABLET: 500 | 30 days supply | Qty: 60 | Fill #1

## 2018-07-11 MED FILL — INDAPAMIDE 1.25 MG TABS: 1.25 | 30 days supply | Qty: 30 | Fill #0

## 2018-07-14 MED FILL — AZITHROMYCIN 500 MG TABLET: 500 | 5 days supply | Qty: 5 | Fill #0

## 2018-08-03 MED FILL — levETIRAcetam 750 MG TABS: 750 | 90 days supply | Qty: 180 | Fill #0

## 2018-08-03 MED FILL — METOPROLOL SUCCINATE ER 100: 100 | 90 days supply | Qty: 90 | Fill #0

## 2018-08-08 MED FILL — AZITHROMYCIN 250 MG TABS: 250 | 5 days supply | Qty: 6 | Fill #0

## 2018-08-08 MED FILL — ALLOPURINOL 100 MG TABS: 100 | 90 days supply | Qty: 90 | Fill #0

## 2018-08-09 MED FILL — AZITHROMYCIN 500 MG TABLET: 500 | 5 days supply | Qty: 5 | Fill #1

## 2018-08-09 MED FILL — INDAPAMIDE 1.25 MG TABS: 1.25 | 30 days supply | Qty: 30 | Fill #1

## 2018-08-10 MED FILL — ALLOPURINOL 100 MG TABS: 100 | 90 days supply | Qty: 90 | Fill #0

## 2018-08-24 MED FILL — AZITHROMYCIN 500 MG TABLET: 500 | 5 days supply | Qty: 5 | Fill #0

## 2018-09-01 MED FILL — BENAZEPRIL HCL 40 MG TABLET: 40 | 90 days supply | Qty: 90 | Fill #0

## 2018-09-01 MED FILL — AMLODIPINE BESYLATE 10 MG T: 10 | 90 days supply | Qty: 90 | Fill #0

## 2018-09-12 MED FILL — DOXAZOSIN MESYLATE 8 MG TAB: 8 | 90 days supply | Qty: 135 | Fill #3

## 2018-10-09 MED FILL — ZOLPIDEM TARTRATE 5 MG TAB: 5 | 30 days supply | Qty: 30 | Fill #1

## 2018-10-17 ENCOUNTER — Encounter: Payer: Self-pay | Admitting: Neurology

## 2018-10-30 MED FILL — METOPROLOL SUCCINATE ER 100: 100 | 90 days supply | Qty: 90 | Fill #0

## 2018-11-02 MED FILL — VIT D2 1.25 MG (50,000 UNIT: 1.25 MG | 84 days supply | Qty: 12 | Fill #0

## 2018-11-03 NOTE — Progress Notes (Addendum)
Dear Dr. Marlou Sa,   Thank you for having me see Joel Duarte in followup today at [facility] for his problem with 3 nocturnal convulsive events.  I reviewed Dr. Jodi Mourning previous notes.  I have updated the patient's history and physical.    As you may recall, he is a 73 y.o. year old male with a history of hypertension who had his first convulsive event about 2 years ago. It occurred at around 7 a.m. and was described as bilateral shaking, with eyes rolling back and then "arm flailing". He had tongue biting and urinary incontinence. He had a similar event that occurred around 6 a.m. in May 2013 but was confused for much longer afterwards as he was given Ativan 4mg  in the E.D for agitation. An MRI at that time was unremarkable except for mild small vessel disease. Routine EEG read by myself was also unremarkable. At that time he elected not to treat with an AED.   Finally, he had one in July, 2013. He was started on Dilantin 100 tid. This occurred at 3:50 a.m. on Friday and was only 1-2 minutes in length. There was no arm flailing. Patient returned to normal soon after the EMS arrived. He was finding the Dilantin was giving him a headache and ultimately it was changed to [place].  He has been seizure-free since last visit in September.  The patient has never had similar events during the day.  There is no family history of seizure.  He is currently on Keppra, 750 mg twice a day.  He denies any side effects with the Keppra.  He is fairly compliant with the medication.  He does admit to sometimes not getting enough sleep.   He admits to crazy dreams and acting out the dreams.    09/27/13 update:  Pt has a hx of frontal lobe seizure.  On keppra 750 mg bid.  Seizure free since 09/2011.  Doing well.  No SE.  No new medical problems.    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  Remains on keppra 750 mg bid.  Doing well.  No SE.  No seizures.  No new medical issues.  Seizure free since  2013.  States updated on labs via PCP.  Just had labs 2 weeks ago.    11/12/15 update:  Pt returns for his yearly f/u.  Doing well on keppra 750 mg bid.  No SE.  No seizures.  Seizure free since 2013.  No aura.  No new medical problems.    11/11/16 update: Patient seen today for follow-up regarding frontal lobe epilepsy.  Patient has had no seizures since our last visit.  He has had no side effects with Keppra, 750 mg twice per day.  He takes this faithfully.  His last seizure was in 2013.  11/04/17 update: Patient is seen today in follow-up for frontal lobe epilepsy.  Patient has been seizure-free since his last visit.  He is faithful with taking Keppra, 750 mg twice per day.  Last seizure was 2013.  11/06/18 update: Patient seen today in follow-up for seizure.  He has been seizure-free since 2013.  Patient is taking his Keppra, 750 mg twice per day.  He is faithful with his medication.  He has no side effects.  No Known Allergies   Current Outpatient Medications on File Prior to Visit  Medication Sig Dispense Refill  . amLODipine (NORVASC) 10 MG tablet Take 10 mg by mouth daily.    . benazepril (  LOTENSIN) 40 MG tablet Take 40 mg by mouth daily.    Marland Kitchen doxazosin (CARDURA) 8 MG tablet Take 8 mg by mouth at bedtime.    . fluticasone (FLONASE) 50 MCG/ACT nasal spray Place into both nostrils daily.    . metoprolol succinate (TOPROL-XL) 100 MG 24 hr tablet Take 100 mg by mouth daily. Take with or immediately following a meal.    . naproxen (NAPROSYN) 500 MG tablet Take 500 mg by mouth daily as needed.    . sildenafil (VIAGRA) 100 MG tablet TAKE 1 TABLET BY MOUTH 1 HOUR PRIOR TO SEXUAL ACTIVITY  2  . Vitamin D, Ergocalciferol, (DRISDOL) 50000 units CAPS capsule Take 50,000 Units by mouth once a week.  3   No current facility-administered medications on file prior to visit.    Past Medical History:  Diagnosis Date  . Allergy   . Enlarged prostate   . Hypertension   . Osteoarthrosis,  unspecified whether generalized or localized, unspecified site   . Seizures ([place])    history of 1 a year ago   Past Surgical History:  Procedure Laterality Date  . no surgical h/o  June 2014   Social History   Socioeconomic History  . Marital status: Married    Spouse name: Not on file  . Number of children: 2  . Years of education: Not on file  . Highest education level: Some college, no degree  Occupational History  . Occupation: Chief Executive Officer business  Social Needs  . Financial resource strain: Not on file  . Food insecurity    Worry: Not on file    Inability: Not on file  . Transportation needs    Medical: Not on file    Non-medical: Not on file  Tobacco Use  . Smoking status: Never Smoker  . Smokeless tobacco: Never Used  Substance and Sexual Activity  . Alcohol use: No  . Drug use: No  . Sexual activity: Not on file  Lifestyle  . Physical activity    Days per week: Not on file    Minutes per session: Not on file  . Stress: Not on file  Relationships  . Social Herbalist on phone: Not on file    Gets together: Not on file    Attends religious service: Not on file    Active member of club or organization: Not on file    Attends meetings of clubs or organizations: Not on file    Relationship status: Not on file  . Intimate partner violence    Fear of current or ex partner: Not on file    Emotionally abused: Not on file    Physically abused: Not on file    Forced sexual activity: Not on file  Other Topics Concern  . Not on file  Social History Narrative  . Not on file   Family Status  Relation Name Status  . Mother  Alive       HTN  . Father  Deceased       unknown cause  . Sister  Alive       healthy  . Child x2 Alive       2, healthy     ROS:  Review of Systems  Constitutional: Negative.   HENT: Negative.   Eyes: Negative.   Respiratory: Negative.   Cardiovascular: Negative.   Gastrointestinal: Negative.   Genitourinary:  Negative.   Musculoskeletal: Negative.   Skin: Negative.   Endo/Heme/Allergies: Negative.  Exam: . Vitals:   11/06/18 1403  BP: 130/75  Pulse: 66  SpO2: 97%  Weight: 227 lb 3.2 oz (103.1 kg)  Height: 6\' 2"  (1.88 m)   Gen:  Appears stated age and in NAD. HEENT:  Normocephalic, atraumatic. The mucous membranes are moist. The superficial temporal arteries are without ropiness or tenderness. Cardiovascular: Regular rate and rhythm with 2-3/6 SEM. Lungs: Clear to auscultation bilaterally. Neck: There are no carotid bruits noted bilaterally.  NEUROLOGICAL:  Orientation:  The patient is alert and oriented x 3.  Recent and remote memory are intact.  Attention span and concentration are normal.  Able to name objects and repeat without trouble.  Fund of knowledge is appropriate Cranial nerves: There is good facial symmetry.    Speech is fluent and clear. Soft palate rises symmetrically and there is no tongue deviation. Hearing is intact to conversational tone. Tone: Tone is good throughout. Sensation: Sensation is intact to light touch throughout Motor: Strength is 5/5 in the bilateral upper and lower extremities.  Shoulder shrug is equal bilaterally.  There is no pronator drift.  There are no fasciculations noted. Gait and Station: The patient is able to ambulate without difficulty.  LABS:  Addendum labs dated October 17, 2018 were received.  Sodium was 143, potassium 4.1, chloride 111, CO2 22, BUN, 24, creatinine was elevated at.  1.61.  AST 15, ALT 10.  Hemoglobin A1c 6.0.  White blood cells were 6.1, hemoglobin 11.2 hematocrit 35.4 and platelets 167.  Impression/Recommendations:   1.  Nocturnal convulsive events - possibly frontal lobe seizures   -pt reports that he had his PE Tuesday and had new labs done.  We called for a copy but I don't have them yet.  Will call again  -refilled keppra 750 mg bid.  Risks, benefits, side effects and alternative therapies were discussed.   The opportunity to ask questions was given and they were answered to the best of my ability.  The patient expressed understanding and willingness to follow the outlined treatment protocols.  Pt asked about long term safety of keppra and discussed that today.  2.  Follow-up 1 year.

## 2018-11-06 ENCOUNTER — Other Ambulatory Visit: Payer: Self-pay

## 2018-11-06 ENCOUNTER — Encounter: Payer: Self-pay | Admitting: Neurology

## 2018-11-06 ENCOUNTER — Ambulatory Visit (INDEPENDENT_AMBULATORY_CARE_PROVIDER_SITE_OTHER): Payer: Medicare Other | Admitting: Neurology

## 2018-11-06 VITALS — BP 130/75 | HR 66 | Ht 74.0 in | Wt 227.2 lb

## 2018-11-06 DIAGNOSIS — G40109 Localization-related (focal) (partial) symptomatic epilepsy and epileptic syndromes with simple partial seizures, not intractable, without status epilepticus: Secondary | ICD-10-CM | POA: Diagnosis not present

## 2018-11-06 MED ORDER — LEVETIRACETAM 750 MG PO TABS: 750.0000 mg | ORAL_TABLET | Freq: Two times a day (BID) | ORAL | 3 refills | Status: DC

## 2018-11-06 MED FILL — levETIRAcetam 750 MG TABS: 750 | 90 days supply | Qty: 180 | Fill #0

## 2018-11-06 NOTE — Patient Instructions (Signed)
The physicians and staff at [facility] are committed to providing excellent care. You may receive a survey requesting feedback about your experience at our office. We strive to receive "very good" responses to the survey questions. If you feel that your experience would prevent you from giving the office a "very good " response, please contact our office to try to remedy the situation. We may be reached at [phone]. Thank you for taking the time out of your busy day to complete the survey.  

## 2018-11-07 MED FILL — INDAPAMIDE 1.25 MG TABS: 1.25 | 90 days supply | Qty: 90 | Fill #0

## 2018-11-28 MED FILL — BENAZEPRIL HCL 40 MG TABLET: 40 | 90 days supply | Qty: 90 | Fill #1

## 2018-11-28 MED FILL — AMLODIPINE BESYLATE 10 MG T: 10 | 90 days supply | Qty: 90 | Fill #1

## 2018-12-11 MED FILL — DOXAZOSIN MESYLATE 8 MG TAB: 8 | 90 days supply | Qty: 135 | Fill #0

## 2019-01-17 MED FILL — NAPROXEN 500 MG TABLET: 500 | 30 days supply | Qty: 60 | Fill #2

## 2019-01-17 MED FILL — METOPROLOL SUCCINATE ER 100: 100 | 90 days supply | Qty: 90 | Fill #1

## 2019-01-23 MED FILL — COLCHICINE 0.6 MG TABS: 0.6 | 30 days supply | Qty: 60 | Fill #0 | Status: TO

## 2019-01-23 MED FILL — COLCHICINE 0.6 MG TABS: 0.6 | 30 days supply | Qty: 60 | Fill #0

## 2019-01-30 MED FILL — INDAPAMIDE 1.25 MG TABS: 1.25 | 90 days supply | Qty: 90 | Fill #1

## 2019-02-05 MED FILL — levETIRAcetam 750 MG TABS: 750 | 90 days supply | Qty: 180 | Fill #1

## 2019-02-12 ENCOUNTER — Ambulatory Visit: Payer: Medicare Other | Attending: Internal Medicine

## 2019-02-12 DIAGNOSIS — Z23 Encounter for immunization: Secondary | ICD-10-CM

## 2019-02-12 NOTE — Progress Notes (Signed)
   Covid-19 Vaccination Clinic  Name:  Joel Duarte    MRN: [id number] DOB: 05-01-1945  02/12/2019  Joel Duarte was observed post Covid-19 immunization for 15 minutes without incidence. He was provided with Vaccine Information Sheet and instruction to access the V-Safe system.   Joel Duarte was instructed to call 911 with any severe reactions post vaccine: Marland Kitchen Difficulty breathing  . Swelling of your face and throat  . A fast heartbeat  . A bad rash all over your body  . Dizziness and weakness    Immunizations Administered    Name Date Dose VIS Date Route   Pfizer COVID-19 Vaccine 02/12/2019  1:56 PM 0.3 mL 12/29/2018 Intramuscular   Manufacturer: [place]   Lot: [id number]   [place]: [id number]

## 2019-02-19 MED FILL — AZITHROMYCIN 500 MG TABLET: 500 | 5 days supply | Qty: 5 | Fill #0

## 2019-02-24 MED FILL — AMLODIPINE BESYLATE 10 MG T: 10 | 90 days supply | Qty: 90 | Fill #0

## 2019-02-24 MED FILL — BENAZEPRIL HCL 40 MG TABLET: 40 | 90 days supply | Qty: 90 | Fill #0

## 2019-03-05 ENCOUNTER — Ambulatory Visit: Payer: Medicare Other | Attending: Internal Medicine

## 2019-03-05 DIAGNOSIS — Z23 Encounter for immunization: Secondary | ICD-10-CM | POA: Insufficient documentation

## 2019-03-05 NOTE — Progress Notes (Signed)
   Covid-19 Vaccination Clinic  Name:  Joel Duarte    MRN: [id number] DOB: 1945/12/11  03/05/2019  [name] was observed post Covid-19 immunization for 15 minutes without incidence. He was provided with Vaccine Information Sheet and instruction to access the V-Safe system.   [name] was instructed to call 911 with any severe reactions post vaccine: Marland Kitchen Difficulty breathing  . Swelling of your face and throat  . A fast heartbeat  . A bad rash all over your body  . Dizziness and weakness    Immunizations Administered    Name Date Dose VIS Date Route   Pfizer COVID-19 Vaccine 03/05/2019 12:19 PM 0.3 mL 12/29/2018 Intramuscular   Manufacturer: [place]   Lot: [id number]   [place]: [id number]

## 2019-03-13 MED FILL — DOXAZOSIN MESYLATE 8 MG TAB: 8 | 90 days supply | Qty: 135 | Fill #1

## 2019-04-02 MED FILL — ALLOPURINOL 100 MG TABS: 100 | 90 days supply | Qty: 90 | Fill #1

## 2019-04-23 MED FILL — AZITHROMYCIN 500 MG TABLET: 500 | 5 days supply | Qty: 5 | Fill #0

## 2019-04-27 MED FILL — ZOLPIDEM TARTRATE 5 MG TAB: 5 | 30 days supply | Qty: 30 | Fill #0

## 2019-04-30 MED FILL — METOPROLOL SUCCINATE ER 100: 100 | 90 days supply | Qty: 90 | Fill #2

## 2019-05-10 MED FILL — INDAPAMIDE 1.25 MG TABS: 1.25 | 90 days supply | Qty: 90 | Fill #2

## 2019-05-10 MED FILL — levETIRAcetam 750 MG TABS: 750 | 90 days supply | Qty: 180 | Fill #2

## 2019-05-25 MED FILL — BENAZEPRIL HCL 40 MG TABLET: 40 | 90 days supply | Qty: 90 | Fill #1

## 2019-05-25 MED FILL — AMLODIPINE BESYLATE 10 MG T: 10 | 90 days supply | Qty: 90 | Fill #1

## 2019-06-11 MED FILL — DOXAZOSIN MESYLATE 8 MG TAB: 8 | 90 days supply | Qty: 135 | Fill #2

## 2019-07-24 ENCOUNTER — Other Ambulatory Visit (HOSPITAL_COMMUNITY): Payer: Self-pay | Admitting: Internal Medicine

## 2019-07-24 MED FILL — AZELASTINE HCL 137 MCG SPRY: 0.1 | 50 days supply | Qty: 30 | Fill #0

## 2019-07-25 MED FILL — METOPROLOL SUCCINATE ER 100: 100 | 90 days supply | Qty: 90 | Fill #0

## 2019-08-08 MED FILL — FLUTICASONE PROP 50 MCG SPR: 50 | 30 days supply | Qty: 16 | Fill #0

## 2019-08-08 MED FILL — levETIRAcetam 750 MG TABS: 750 | 90 days supply | Qty: 180 | Fill #3

## 2019-08-08 MED FILL — INDAPAMIDE 1.25 MG TABS: 1.25 | 90 days supply | Qty: 90 | Fill #3

## 2019-08-27 MED FILL — BENAZEPRIL HCL 40 MG TABLET: 40 | 90 days supply | Qty: 90 | Fill #2

## 2019-08-27 MED FILL — AMLODIPINE BESYLATE 10 MG T: 10 | 90 days supply | Qty: 90 | Fill #2

## 2019-09-11 ENCOUNTER — Other Ambulatory Visit (HOSPITAL_COMMUNITY): Payer: Self-pay | Admitting: Internal Medicine

## 2019-09-12 MED FILL — ZOLPIDEM TARTRATE 5 MG TAB: 5 | 30 days supply | Qty: 30 | Fill #0

## 2019-09-12 MED FILL — DOXAZOSIN MESYLATE 8 MG TAB: 8 | 90 days supply | Qty: 135 | Fill #0

## 2019-10-01 ENCOUNTER — Other Ambulatory Visit (HOSPITAL_COMMUNITY): Payer: Self-pay | Admitting: Internal Medicine

## 2019-10-01 MED FILL — ALLOPURINOL 100 MG TABS: 100 | 90 days supply | Qty: 90 | Fill #0

## 2019-10-02 MED FILL — AZITHROMYCIN 500 MG TABLET: 500 | 5 days supply | Qty: 5 | Fill #1

## 2019-10-23 ENCOUNTER — Other Ambulatory Visit (HOSPITAL_COMMUNITY): Payer: Self-pay | Admitting: Internal Medicine

## 2019-10-23 MED FILL — AZITHROMYCIN 250 MG TABLET: 250 | 5 days supply | Qty: 6 | Fill #0

## 2019-10-23 MED FILL — FUROSEMIDE 20 MG TABS: 20 | 90 days supply | Qty: 90 | Fill #0

## 2019-10-23 MED FILL — DICLOFENAC SODIUM 1 % GEL: 1 | 10 days supply | Qty: 100 | Fill #0

## 2019-10-30 NOTE — Progress Notes (Signed)
Assessment/Plan:   1. 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  -Patient will continue his Keppra, 750 mg twice per day.  2.  Hypertension with renal insufficiency  -Discussed importance of controlling blood pressure.  -Discussed that if kidney function got significantly worse we would have to renally dose Keppra.  3.  Follow-up 1 year   Subjective:   Joel Duarte was seen today in follow up for seizure/frontal lobe epilepsy.  My previous records as well as any outside records available were reviewed prior to todays visit.  Pt is currently on Keppra, 750 mg twice per day.  Pt denies falls.  Pt denies lightheadedness, near syncope.  No hallucinations.  Mood has been good. Last seizure was in 2013. Patient is faithful with taking his medication.   PREVIOUS MEDICATIONS: Dilantin (headache)  CURRENT MEDICATIONS:  Outpatient Encounter Medications as of 11/06/2019  Medication Sig   amLODipine (NORVASC) 10 MG tablet Take 10 mg by mouth daily.   benazepril (LOTENSIN) 40 MG tablet Take 40 mg by mouth daily.   doxazosin (CARDURA) 8 MG tablet Take 8 mg by mouth at bedtime.   fluticasone (FLONASE) 50 MCG/ACT nasal spray Place into both nostrils daily.   levETIRAcetam (KEPPRA) 750 MG tablet Take 1 tablet (750 mg total) by mouth 2 (two) times daily.   metoprolol succinate (TOPROL-XL) 100 MG 24 hr tablet Take 100 mg by mouth daily. Take with or immediately following a meal.   naproxen (NAPROSYN) 500 MG tablet Take 500 mg by mouth daily as needed.   sildenafil (VIAGRA) 100 MG tablet TAKE 1 TABLET BY MOUTH 1 HOUR PRIOR TO SEXUAL ACTIVITY   Vitamin D, Ergocalciferol, (DRISDOL) 50000 units CAPS capsule Take 50,000 Units by mouth once a week.   No facility-administered encounter medications on file as of 11/06/2019.     Objective:   PHYSICAL EXAMINATION:    VITALS:   Vitals:   11/06/19 1438  BP: (!) 155/81  Pulse: 72  SpO2: 97%  Weight: 233 lb (105.7 kg)    Height: 6\' 2"  (1.88 m)    GEN:  The patient appears stated age and is in NAD. HEENT:  Normocephalic, atraumatic.  The mucous membranes are moist. The superficial temporal arteries are without ropiness or tenderness. CV:  RRR Lungs:  CTAB Neck/HEME:  There are no carotid bruits bilaterally.  Neurological examination:  Orientation: The patient is alert and oriented x3. Cranial nerves: There is good facial symmetry.The speech is fluent and clear. Soft palate rises symmetrically and there is no tongue deviation. Hearing is intact to conversational tone. Sensation: Sensation is intact to light touch throughout Motor: Strength is 5/5 in the bilateral upper and lower extremities.  There is no pronator drift.  Movement examination: Tone: There is normal tone in the UE/LE Abnormal movements:  no tremor.  No myoclonus.  No asterixis.   Coordination:  There is no decremation with RAM's. Gait and Station: The patient has no difficulty arising out of a deep-seated chair without the use of the hands. The patient's stride length is good.  He is able to ambulate in a tandem fashion.  He is able to stand in Romberg position.  I have reviewed and interpreted the following labs independently Patient had lab work on September 19, 2019.  Total cholesterol was 154, HDL 43, LDL 99, sodium 142, potassium 4.4, chloride 110, CO2 25, BUN 41, creatinine 2.09 (last year 1.6) AST 18, ALT 13, hemoglobin A1c 6.0, white blood cells 4.8, hemoglobin 11.5, hematocrit 37.2, platelets  165   Total time spent on today's visit was 30 minutes, including both face-to-face time and nonface-to-face time.  Time included that spent on review of records (prior notes available to me/labs/imaging if pertinent), discussing treatment and goals, answering patient's questions and coordinating care.  Cc:  Rogers Blocker, MD

## 2019-10-31 MED FILL — METOPROLOL SUCCINATE ER 100: 100 | 90 days supply | Qty: 90 | Fill #1

## 2019-11-03 ENCOUNTER — Other Ambulatory Visit: Payer: Self-pay

## 2019-11-03 ENCOUNTER — Ambulatory Visit: Payer: Medicare Other | Attending: Internal Medicine

## 2019-11-03 DIAGNOSIS — Z23 Encounter for immunization: Secondary | ICD-10-CM

## 2019-11-03 NOTE — Progress Notes (Signed)
   Covid-19 Vaccination Clinic  Name:  Gladys Gutman    MRN: [id number] DOB: 1945-09-03  11/03/2019  Mr. Welchel was observed post Covid-19 immunization for 15 minutes without incident. He was provided with Vaccine Information Sheet and instruction to access the V-Safe system.   Mr. Mckelvie was instructed to call 911 with any severe reactions post vaccine: Marland Kitchen Difficulty breathing  . Swelling of face and throat  . A fast heartbeat  . A bad rash all over body  . Dizziness and weakness

## 2019-11-06 ENCOUNTER — Other Ambulatory Visit: Payer: Self-pay

## 2019-11-06 ENCOUNTER — Ambulatory Visit (INDEPENDENT_AMBULATORY_CARE_PROVIDER_SITE_OTHER): Payer: Medicare Other | Admitting: Neurology

## 2019-11-06 ENCOUNTER — Encounter: Payer: Self-pay | Admitting: Neurology

## 2019-11-06 ENCOUNTER — Other Ambulatory Visit: Payer: Self-pay | Admitting: Neurology

## 2019-11-06 VITALS — BP 155/81 | HR 72 | Ht 74.0 in | Wt 233.0 lb

## 2019-11-06 DIAGNOSIS — I10 Essential (primary) hypertension: Secondary | ICD-10-CM

## 2019-11-06 DIAGNOSIS — G40109 Localization-related (focal) (partial) symptomatic epilepsy and epileptic syndromes with simple partial seizures, not intractable, without status epilepticus: Secondary | ICD-10-CM

## 2019-11-06 DIAGNOSIS — N289 Disorder of kidney and ureter, unspecified: Secondary | ICD-10-CM

## 2019-11-06 MED ORDER — LEVETIRACETAM 750 MG PO TABS: 750.0000 mg | ORAL_TABLET | Freq: Two times a day (BID) | ORAL | 3 refills | Status: DC

## 2019-11-06 MED FILL — levETIRAcetam 750 MG TABS: 750 | 90 days supply | Qty: 180 | Fill #0

## 2019-11-06 NOTE — Patient Instructions (Signed)
You look great!  No changes in medication.  The physicians and staff at [facility] are committed to providing excellent care. You may receive a survey requesting feedback about your experience at our office. We strive to receive "very good" responses to the survey questions. If you feel that your experience would prevent you from giving the office a "very good " response, please contact our office to try to remedy the situation. We may be reached at [phone]. Thank you for taking the time out of your busy day to complete the survey.

## 2019-11-20 ENCOUNTER — Other Ambulatory Visit: Payer: Self-pay | Admitting: Internal Medicine

## 2019-11-20 ENCOUNTER — Ambulatory Visit
Admission: RE | Admit: 2019-11-20 | Discharge: 2019-11-20 | Disposition: A | Discharge status: Home / Self Care | Payer: Medicare Other | Source: Ambulatory Visit | Attending: Internal Medicine | Admitting: Internal Medicine

## 2019-11-20 DIAGNOSIS — R059 Cough, unspecified: Secondary | ICD-10-CM

## 2019-11-21 ENCOUNTER — Other Ambulatory Visit (HOSPITAL_COMMUNITY): Payer: Self-pay | Admitting: Internal Medicine

## 2019-11-21 MED FILL — AMOX-CLAV 500-125 MG TABLET: 500-125 | 10 days supply | Qty: 20 | Fill #0

## 2019-11-22 ENCOUNTER — Other Ambulatory Visit (HOSPITAL_COMMUNITY): Payer: Self-pay | Admitting: Internal Medicine

## 2019-11-22 MED FILL — AMLODIPINE BESYLATE 10 MG T: 10 | 90 days supply | Qty: 90 | Fill #0

## 2019-11-26 ENCOUNTER — Other Ambulatory Visit: Payer: Self-pay | Admitting: Internal Medicine

## 2019-11-26 ENCOUNTER — Other Ambulatory Visit (HOSPITAL_COMMUNITY): Payer: Self-pay | Admitting: Internal Medicine

## 2019-11-26 DIAGNOSIS — K219 Gastro-esophageal reflux disease without esophagitis: Secondary | ICD-10-CM

## 2019-11-28 ENCOUNTER — Ambulatory Visit (HOSPITAL_COMMUNITY)
Admission: RE | Admit: 2019-11-28 | Discharge: 2019-11-28 | Disposition: A | Discharge status: Home / Self Care | Payer: Medicare Other | Source: Ambulatory Visit | Attending: Internal Medicine | Admitting: Internal Medicine

## 2019-11-28 ENCOUNTER — Other Ambulatory Visit: Payer: Self-pay

## 2019-11-28 DIAGNOSIS — K219 Gastro-esophageal reflux disease without esophagitis: Secondary | ICD-10-CM

## 2019-12-07 ENCOUNTER — Ambulatory Visit (INDEPENDENT_AMBULATORY_CARE_PROVIDER_SITE_OTHER): Payer: Medicare Other | Admitting: Urology

## 2019-12-07 ENCOUNTER — Other Ambulatory Visit: Payer: Self-pay

## 2019-12-07 ENCOUNTER — Encounter: Payer: Self-pay | Admitting: Urology

## 2019-12-07 VITALS — BP 147/65 | HR 96 | Temp 98.2°F | Ht 79.0 in | Wt 230.0 lb

## 2019-12-07 DIAGNOSIS — C61 Malignant neoplasm of prostate: Secondary | ICD-10-CM | POA: Diagnosis not present

## 2019-12-07 DIAGNOSIS — R39198 Other difficulties with micturition: Secondary | ICD-10-CM | POA: Diagnosis not present

## 2019-12-07 LAB — MICROSCOPIC EXAMINATION
Bacteria, UA: NONE SEEN
Epithelial Cells (non renal): NONE SEEN /hpf (ref 0–10)
RBC, Urine: >30 /hpf — AB (ref 0–2)
Renal Epithel, UA: NONE SEEN /hpf
WBC, UA: NONE SEEN /hpf (ref 0–5)

## 2019-12-07 LAB — URINALYSIS, ROUTINE W REFLEX MICROSCOPIC
Bilirubin, UA: NEGATIVE
Glucose, UA: NEGATIVE
Leukocytes,UA: NEGATIVE
Nitrite, UA: NEGATIVE
Specific Gravity, UA: 1.025 (ref 1.005–1.030)
Urobilinogen, Ur: 1 mg/dL (ref 0.2–1.0)
pH, UA: 5.5 (ref 5.0–7.5)

## 2019-12-07 LAB — BLADDER SCAN AMB NON-IMAGING: Scan Result: 129

## 2019-12-07 MED ORDER — SILODOSIN 8 MG PO CAPS: 8.0000 mg | ORAL_CAPSULE | Freq: Every day | ORAL | 11 refills | Status: DC

## 2019-12-07 MED ORDER — SULFAMETHOXAZOLE-TRIMETHOPRIM 800-160 MG PO TABS: 1.0000 | ORAL_TABLET | Freq: Two times a day (BID) | ORAL | 0 refills | Status: DC

## 2019-12-07 MED FILL — SULFAMETHOXAZOLE-TMP DS TAB: 800-160 | 7 days supply | Qty: 14 | Fill #0

## 2019-12-07 NOTE — Progress Notes (Signed)
Bladder Scan Patient can void: 129 ml Performed By: Pesach Frisch,LPN   Urological Symptom Review  Patient is experiencing the following symptoms: Frequent urination Hard to postpone urination Get up at night to urinate Trouble starting stream Blood in urine Weak stream   Review of Systems  Gastrointestinal (upper)  : Negative for upper GI symptoms  Gastrointestinal (lower) : Negative for lower GI symptoms  Constitutional : Negative for symptoms  Skin: Negative for skin symptoms  Eyes: Negative for eye symptoms  Ear/Nose/Throat : Negative for Ear/Nose/Throat symptoms  Hematologic/Lymphatic: Negative for Hematologic/Lymphatic symptoms  Cardiovascular : Negative for cardiovascular symptoms  Respiratory : Negative for respiratory symptoms  Endocrine: Negative for endocrine symptoms  Musculoskeletal: Joint pain  Neurological: Negative for neurological symptoms  Psychologic: Negative for psychiatric symptoms

## 2019-12-07 NOTE — Progress Notes (Signed)
12/07/2019 3:38 PM   Joel Duarte 20-Jun-1945 [id number]  Referring provider: Rogers Blocker, MD [address] [place],  [place] [address]  Urinary retention  HPI: Joel Duarte is a 74yo her for evaluation of urinary retention. Starting 1 week ago he developed difficulty urinating and is now doing CIC. He has a hx of urinary retention after a prostate biopsy 5 years ago. He was previously on flomax and uroxatral which failed to improve his LUTS.  He has been having intermittent gross hematuria for the past month. He has a hx fo prostate cancer diagnosed in 2011.   His records from [facility] are as follows: I have prostate cancer.  HPI: Joel Duarte is a 74 year-old male established patient who is here evaluation for treatment of prostate cancer.  His prostate cancer was diagnosed approximately 07/18/2008. He does have the pathology report from his biopsy. His cancer was diagnosed by [facility]. His PSA at his time of diagnosis was 7.73. His most recent PSA is 16.9.   He has not undergone surgery for treatment. He has not undergone External Beam Radiation Therapy for treatment. He has not undergone Hormonal Therapy for treatment.   He does not have urinary incontinence. He does not have problems with erectile dysfunction. He has not recently had unwanted weight loss. He is not having pain in new locations.   07/21/2015: He has Gleason 6 prostate cancer in <5% of one core at the right base diagnosed in July 2010. He is on active surveillance. PSA at diagnosis was 7.73. Repeat biopsy in October 2011 was negative. PSA at that time was 10.60. He went into urinary retention after the second biopsy and has since been reluctant to have another biopsy. His PSA was 12.86 in February 2015, 13.48 in March and is now 12.48. He is known to have a markedly enlarged prostate that measured 160 ml in 2011. That could explain his elevated PSA. MRI in March 2015 showed markedly enlarged prostate and suspicious left  peripheral base and apex. The biopsy was positive at the right base in 2010   10/19/2016: His PSA increased to 16.9 from 13.     CC: I have an enlarged prostate (follow-up).  HPI: He is currently taking cardura. He is not on new medications for symptoms of prostate enlargement.   He does have an abnormal sensation when needing to urinate. He is not having problems getting his urine stream started. He does not have a good size and strength to his urinary stream. He is not having problems with emptying his bladder well. He does dribble at the end of urination.   07/21/2015: In 07/2014 he had issues with urinary retention and was diagnosed with a UTI. After completing the antibiotics his urination improved. He was on rapflo but stopped it and went back on to cardura. He has a weaker stream, nocturia 2x. He is bothered by urinary frequency. He has morning frequency 4x over a 5 hour period. We tried flomax in AM but pt stopped it due to weakness.   10/19/2016: He is on cardura QHS with weak stream and nocturia 3x       PMH: Past Medical History:  Diagnosis Date  . Allergy   . Enlarged prostate   . Gout   . Hypertension   . Osteoarthrosis, unspecified whether generalized or localized, unspecified site   . Seizures ([place])    history of 1 a year ago    Surgical History: Past Surgical History:  Procedure Laterality Date  .  no surgical h/o  June 2014    Home Medications:  Allergies as of 12/07/2019   No Known Allergies     Medication List       Accurate as of December 07, 2019  3:38 PM. If you have any questions, ask your nurse or doctor.        STOP taking these medications   amoxicillin-clavulanate 500-125 MG tablet Commonly known as: AUGMENTIN Stopped by: Nicolette Bang, MD     TAKE these medications   allopurinol 100 MG tablet Commonly known as: ZYLOPRIM   amLODipine 10 MG tablet Commonly known as: NORVASC Take 10 mg by mouth daily.   benazepril 40 MG  tablet Commonly known as: LOTENSIN Take 40 mg by mouth daily.   diclofenac Sodium 1 % Gel Commonly known as: VOLTAREN   doxazosin 8 MG tablet Commonly known as: CARDURA Take 8 mg by mouth at bedtime.   fluticasone 50 MCG/ACT nasal spray Commonly known as: FLONASE Place into both nostrils daily.   levETIRAcetam 750 MG tablet Commonly known as: KEPPRA Take 1 tablet (750 mg total) by mouth 2 (two) times daily.   metoprolol succinate 100 MG 24 hr tablet Commonly known as: TOPROL-XL Take 100 mg by mouth daily. Take with or immediately following a meal.   naproxen 500 MG tablet Commonly known as: NAPROSYN Take 500 mg by mouth daily as needed.   sildenafil 100 MG tablet Commonly known as: VIAGRA TAKE 1 TABLET BY MOUTH 1 HOUR PRIOR TO SEXUAL ACTIVITY   Vitamin D (Ergocalciferol) 1.25 MG (50000 UNIT) Caps capsule Commonly known as: DRISDOL Take 50,000 Units by mouth once a week.       Allergies: No Known Allergies  Family History: Family History  Problem Relation Age of Onset  . Hypertension Mother   . Healthy Sister   . Healthy Child     Social History:  reports that he has never smoked. He has never used smokeless tobacco. He reports current alcohol use. He reports that he does not use drugs.  ROS: All other review of systems were reviewed and are negative except what is noted above in HPI  Physical Exam: BP (!) 147/65   Pulse 96   Temp 98.2 F (36.8 C)   Ht 6\' 7"  (2.007 m)   Wt 230 lb (104.3 kg)   BMI 25.91 kg/m   Constitutional:  Alert and oriented, No acute distress. HEENT: [place] AT, moist mucus membranes.  Trachea midline, no masses. Cardiovascular: No clubbing, cyanosis, or edema. Respiratory: Normal respiratory effort, no increased work of breathing. GI: Abdomen is soft, nontender, nondistended, no abdominal masses GU: No CVA tenderness.  Lymph: No cervical or inguinal lymphadenopathy. Skin: No rashes, bruises or suspicious lesions. Neurologic:  Grossly intact, no focal deficits, moving all 4 extremities. Psychiatric: Normal mood and affect.  Laboratory Data: Lab Results  Component Value Date   WBC 10.6 (H) 08/06/2011   HGB 13.9 03/27/2013   HCT 41.0 03/27/2013   MCV 79.0 08/06/2011   PLT 158 08/06/2011    Lab Results  Component Value Date   CREATININE 1.20 03/27/2013    No results found for: PSA  No results found for: TESTOSTERONE  Lab Results  Component Value Date   HGBA1C 6.0 11/30/2011    Urinalysis    Component Value Date/Time   COLORURINE YELLOW 06/07/2011 0856   APPEARANCEUR CLEAR 06/07/2011 0856   LABSPEC 1.013 06/07/2011 0856   PHURINE 5.0 06/07/2011 0856   GLUCOSEU 100 (A) 06/07/2011 9604  HGBUR TRACE (A) 06/07/2011 0856   BILIRUBINUR NEGATIVE 06/07/2011 0856   KETONESUR NEGATIVE 06/07/2011 0856   PROTEINUR 30 (A) 06/07/2011 0856   UROBILINOGEN 0.2 06/07/2011 0856   NITRITE NEGATIVE 06/07/2011 0856   LEUKOCYTESUR NEGATIVE 06/07/2011 0856    Lab Results  Component Value Date   BACTERIA RARE 06/07/2011    Pertinent Imaging:  No results found for this or any previous visit.  No results found for this or any previous visit.  No results found for this or any previous visit.  No results found for this or any previous visit.  No results found for this or any previous visit.  No results found for this or any previous visit.  No results found for this or any previous visit.  No results found for this or any previous visit.   Assessment & Plan:    1. Difficulty urinating -urine for culture, will call with results -rapaflo 8mg  qhs - Urinalysis, Routine w reflex microscopic - BLADDER SCAN AMB NON-IMAGING  2. Prostate cancer ([place]) -   No follow-ups on file.  Nicolette Bang, MD  [facility] [place]

## 2019-12-07 NOTE — Patient Instructions (Signed)

## 2019-12-10 LAB — URINE CULTURE: Organism ID, Bacteria: NO GROWTH

## 2019-12-10 MED FILL — DOXAZOSIN MESYLATE 8 MG TAB: 8 | 90 days supply | Qty: 135 | Fill #1

## 2019-12-10 MED FILL — CARVEDILOL 12.5 MG TABLET: 12.5 | 30 days supply | Qty: 60 | Fill #0

## 2019-12-12 ENCOUNTER — Other Ambulatory Visit (HOSPITAL_COMMUNITY): Payer: Self-pay | Admitting: Urology

## 2019-12-12 MED FILL — TAMSULOSIN HCL 0.4 MG CAP: 0.4 | 30 days supply | Qty: 60 | Fill #0

## 2019-12-12 NOTE — Progress Notes (Signed)
Results sent via my chart 

## 2019-12-18 ENCOUNTER — Other Ambulatory Visit: Payer: Self-pay | Admitting: Internal Medicine

## 2019-12-18 DIAGNOSIS — N189 Chronic kidney disease, unspecified: Secondary | ICD-10-CM

## 2019-12-18 DIAGNOSIS — N139 Obstructive and reflux uropathy, unspecified: Secondary | ICD-10-CM

## 2019-12-19 ENCOUNTER — Ambulatory Visit
Admission: RE | Admit: 2019-12-19 | Discharge: 2019-12-19 | Disposition: A | Discharge status: Home / Self Care | Payer: Medicare Other | Source: Ambulatory Visit | Attending: Internal Medicine | Admitting: Internal Medicine

## 2019-12-19 DIAGNOSIS — N189 Chronic kidney disease, unspecified: Secondary | ICD-10-CM

## 2019-12-19 DIAGNOSIS — N139 Obstructive and reflux uropathy, unspecified: Secondary | ICD-10-CM

## 2019-12-21 ENCOUNTER — Ambulatory Visit (INDEPENDENT_AMBULATORY_CARE_PROVIDER_SITE_OTHER): Payer: Medicare Other | Admitting: Urology

## 2019-12-21 ENCOUNTER — Encounter: Payer: Self-pay | Admitting: Urology

## 2019-12-21 ENCOUNTER — Other Ambulatory Visit: Payer: Self-pay

## 2019-12-21 ENCOUNTER — Other Ambulatory Visit: Payer: Self-pay | Admitting: Urology

## 2019-12-21 VITALS — BP 132/70 | HR 76 | Temp 98.1°F | Ht 74.0 in | Wt 230.0 lb

## 2019-12-21 DIAGNOSIS — C61 Malignant neoplasm of prostate: Secondary | ICD-10-CM | POA: Diagnosis not present

## 2019-12-21 DIAGNOSIS — N138 Other obstructive and reflux uropathy: Secondary | ICD-10-CM | POA: Diagnosis not present

## 2019-12-21 DIAGNOSIS — R39198 Other difficulties with micturition: Secondary | ICD-10-CM

## 2019-12-21 DIAGNOSIS — N401 Enlarged prostate with lower urinary tract symptoms: Secondary | ICD-10-CM | POA: Insufficient documentation

## 2019-12-21 LAB — URINALYSIS, ROUTINE W REFLEX MICROSCOPIC
Bilirubin, UA: NEGATIVE
Glucose, UA: NEGATIVE
Ketones, UA: NEGATIVE
Leukocytes,UA: NEGATIVE
Nitrite, UA: NEGATIVE
Specific Gravity, UA: 1.02 (ref 1.005–1.030)
Urobilinogen, Ur: 0.2 mg/dL (ref 0.2–1.0)
pH, UA: 5.5 (ref 5.0–7.5)

## 2019-12-21 LAB — MICROSCOPIC EXAMINATION
Bacteria, UA: NONE SEEN
Renal Epithel, UA: NONE SEEN /hpf
WBC, UA: NONE SEEN /hpf (ref 0–5)

## 2019-12-21 LAB — BLADDER SCAN AMB NON-IMAGING: Scan Result: 62

## 2019-12-21 MED ORDER — FINASTERIDE 5 MG PO TABS: 5.0000 mg | ORAL_TABLET | Freq: Every day | ORAL | 3 refills | Status: DC

## 2019-12-21 MED ORDER — TAMSULOSIN HCL 0.4 MG PO CAPS
0.4000 mg | ORAL_CAPSULE | Freq: Two times a day (BID) | ORAL | 11 refills | Status: DC
Start: 2019-12-21 — End: 2020-03-17

## 2019-12-21 MED FILL — FINASTERIDE 5 MG TABLET: 5 | 90 days supply | Qty: 90 | Fill #0

## 2019-12-21 NOTE — Progress Notes (Signed)
12/21/2019 1:33 PM   [name] 1945/02/26 [id number]  Referring provider: Rogers Blocker, MD [address] [place],  [place] [address]  No chief complaint on file.   HPI:    PMH: Past Medical History:  Diagnosis Date  . Allergy   . Enlarged prostate   . Gout   . Hypertension   . Osteoarthrosis, unspecified whether generalized or localized, unspecified site   . Seizures ([place])    history of 1 a year ago    Surgical History: Past Surgical History:  Procedure Laterality Date  . no surgical h/o  June 2014    Home Medications:  Allergies as of 12/21/2019   No Known Allergies     Medication List       Accurate as of December 21, 2019  1:33 PM. If you have any questions, ask your nurse or doctor.        STOP taking these medications   allopurinol 100 MG tablet Commonly known as: ZYLOPRIM Stopped by: Nicolette Bang, MD   silodosin 8 MG Caps capsule Commonly known as: RAPAFLO Stopped by: Nicolette Bang, MD     TAKE these medications   amLODipine 10 MG tablet Commonly known as: NORVASC Take 10 mg by mouth daily.   benazepril 40 MG tablet Commonly known as: LOTENSIN Take 40 mg by mouth daily.   carvedilol 12.5 MG tablet Commonly known as: COREG   diclofenac Sodium 1 % Gel Commonly known as: VOLTAREN   doxazosin 8 MG tablet Commonly known as: CARDURA Take 8 mg by mouth at bedtime.   fluticasone 50 MCG/ACT nasal spray Commonly known as: FLONASE Place into both nostrils daily.   furosemide 20 MG tablet Commonly known as: LASIX   levETIRAcetam 750 MG tablet Commonly known as: KEPPRA Take 1 tablet (750 mg total) by mouth 2 (two) times daily.   metoprolol succinate 100 MG 24 hr tablet Commonly known as: TOPROL-XL Take 100 mg by mouth daily. Take with or immediately following a meal.   naproxen 500 MG tablet Commonly known as: NAPROSYN Take 500 mg by mouth daily as needed.   sildenafil 100 MG tablet Commonly known as: VIAGRA TAKE 1  TABLET BY MOUTH 1 HOUR PRIOR TO SEXUAL ACTIVITY   sulfamethoxazole-trimethoprim 800-160 MG tablet Commonly known as: BACTRIM DS Take 1 tablet by mouth every 12 (twelve) hours.   tamsulosin 0.4 MG Caps capsule Commonly known as: FLOMAX   Vitamin D (Ergocalciferol) 1.25 MG (50000 UNIT) Caps capsule Commonly known as: DRISDOL Take 50,000 Units by mouth once a week.       Allergies: No Known Allergies  Family History: Family History  Problem Relation Age of Onset  . Hypertension Mother   . Healthy Sister   . Healthy Child     Social History:  reports that he has never smoked. He has never used smokeless tobacco. He reports current alcohol use. He reports that he does not use drugs.  ROS: All other review of systems were reviewed and are negative except what is noted above in HPI  Physical Exam: BP 132/70   Pulse 76   Temp 98.1 F (36.7 C)   Ht 6\' 2"  (1.88 m)   Wt 230 lb (104.3 kg)   BMI 29.53 kg/m   Constitutional:  Alert and oriented, No acute distress. HEENT:  AT, moist mucus membranes.  Trachea midline, no masses. Cardiovascular: No clubbing, cyanosis, or edema. Respiratory: Normal respiratory effort, no increased work of breathing. GI: Abdomen is soft, nontender, nondistended,  no abdominal masses GU: No CVA tenderness.  Lymph: No cervical or inguinal lymphadenopathy. Skin: No rashes, bruises or suspicious lesions. Neurologic: Grossly intact, no focal deficits, moving all 4 extremities. Psychiatric: Normal mood and affect.  Laboratory Data: Lab Results  Component Value Date   WBC 10.6 (H) 08/06/2011   HGB 13.9 03/27/2013   HCT 41.0 03/27/2013   MCV 79.0 08/06/2011   PLT 158 08/06/2011    Lab Results  Component Value Date   CREATININE 1.20 03/27/2013    No results found for: PSA  No results found for: TESTOSTERONE  Lab Results  Component Value Date   HGBA1C 6.0 11/30/2011    Urinalysis    Component Value Date/Time   COLORURINE YELLOW  06/07/2011 0856   APPEARANCEUR Cloudy (A) 12/07/2019 1532   LABSPEC 1.013 06/07/2011 0856   PHURINE 5.0 06/07/2011 0856   GLUCOSEU Negative 12/07/2019 1532   HGBUR TRACE (A) 06/07/2011 0856   BILIRUBINUR Negative 12/07/2019 [place] 06/07/2011 0856   PROTEINUR 3+ (A) 12/07/2019 1532   PROTEINUR 30 (A) 06/07/2011 0856   UROBILINOGEN 0.2 06/07/2011 0856   NITRITE Negative 12/07/2019 1532   NITRITE NEGATIVE 06/07/2011 0856   LEUKOCYTESUR Negative 12/07/2019 1532    Lab Results  Component Value Date   LABMICR See below: 12/07/2019   WBCUA None seen 12/07/2019   LABEPIT None seen 12/07/2019   MUCUS Present 12/07/2019   BACTERIA None seen 12/07/2019    Pertinent Imaging:  No results found for this or any previous visit.  No results found for this or any previous visit.  No results found for this or any previous visit.  No results found for this or any previous visit.  Results for orders placed during the hospital encounter of 12/19/19  US RENAL  Narrative CLINICAL DATA:  Progressive chronic renal failure, obstructive Rob a thick, hypertension  EXAM: RENAL / URINARY TRACT ULTRASOUND COMPLETE  COMPARISON:  None  FINDINGS: Right Kidney:  Renal measurements: 8.4 x 3.3 x 4.4 cm = volume: 69 mL. Cortical thinning. Probably slightly increased cortical echogenicity. No mass, hydronephrosis, or shadowing calcification.  Left Kidney:  Renal measurements: 11.3 x 6.4 x 5.5 cm = volume: 2 8 mL. Cortical thinning. Increased cortical echogenicity. Cyst at mid upper pole 3.2 x 3.1 x 4.1 cm, simple features. No additional mass, hydronephrosis, or shadowing calcification.  Bladder:  Appears normal for partial degree of bladder distention.  Other:  Enlarged prostate gland 4.9 x 4.7 x 7.0 cm (volume = 84 cm^3) extending into base of urinary bladder  IMPRESSION: Cortical thinning and probable medical renal disease changes of both kidneys.  4.1 cm LEFT  renal cyst.  No evidence of additional renal mass or hydronephrosis.  Prostatomegaly.   Electronically Signed By: Lavonia Dana M.D. On: 12/20/2019 10:53  No results found for this or any previous visit.  No results found for this or any previous visit.  No results found for this or any previous visit.   Assessment & Plan:    1. Difficulty urinating -continue flomax BID and start finasteride 5mg   - BLADDER SCAN AMB NON-IMAGING - Urinalysis, Routine w reflex microscopic  2. Benign prostatic hyperplasia with urinary obstruction Continue flomax BID and start finasteride 5mg  daily   No follow-ups on file.  Nicolette Bang, MD  [facility] [place]

## 2019-12-21 NOTE — Progress Notes (Signed)
Bladder Scan Patient can void: 62 ml Performed By: Juanya Villavicencio,lpn   Urological Symptom Review  Patient is experiencing the following symptoms: Frequent urination Get up at night to urinate Weak stream   Review of Systems  Gastrointestinal (upper)  : Negative for upper GI symptoms  Gastrointestinal (lower) : Negative for lower GI symptoms  Constitutional : Negative for symptoms  Skin: Negative for skin symptoms  Eyes: Negative for eye symptoms  Ear/Nose/Throat : Sinus problems  Hematologic/Lymphatic: Negative for Hematologic/Lymphatic symptoms  Cardiovascular : Negative for cardiovascular symptoms  Respiratory : Negative for respiratory symptoms  Endocrine: Negative for endocrine symptoms  Musculoskeletal: Negative for musculoskeletal symptoms  Neurological: Negative for neurological symptoms  Psychologic: Negative for psychiatric symptoms

## 2019-12-21 NOTE — Patient Instructions (Signed)

## 2019-12-24 ENCOUNTER — Ambulatory Visit
Admission: RE | Admit: 2019-12-24 | Discharge: 2019-12-24 | Disposition: A | Discharge status: Home / Self Care | Payer: Medicare Other | Source: Ambulatory Visit | Attending: Internal Medicine | Admitting: Internal Medicine

## 2019-12-24 ENCOUNTER — Other Ambulatory Visit: Payer: Self-pay | Admitting: Internal Medicine

## 2019-12-24 DIAGNOSIS — J189 Pneumonia, unspecified organism: Secondary | ICD-10-CM

## 2019-12-25 ENCOUNTER — Other Ambulatory Visit (HOSPITAL_COMMUNITY): Payer: Self-pay | Admitting: Internal Medicine

## 2019-12-25 MED FILL — BENAZEPRIL HCL 40 MG TABLET: 40 | 90 days supply | Qty: 90 | Fill #0

## 2019-12-27 MED FILL — ALLOPURINOL 100 MG TABS: 100 | 90 days supply | Qty: 90 | Fill #1

## 2020-01-04 ENCOUNTER — Ambulatory Visit: Payer: Medicare Other | Admitting: Urology

## 2020-01-07 MED FILL — CARVEDILOL 12.5 MG TABLET: 12.5 | 30 days supply | Qty: 60 | Fill #1

## 2020-01-10 ENCOUNTER — Other Ambulatory Visit (HOSPITAL_COMMUNITY): Payer: Self-pay | Admitting: Internal Medicine

## 2020-01-10 MED FILL — AZITHROMYCIN 250 MG TABLET: 250 | 5 days supply | Qty: 6 | Fill #0

## 2020-01-16 ENCOUNTER — Telehealth: Payer: Self-pay

## 2020-01-16 NOTE — Telephone Encounter (Signed)
Pharmacy called saying pts med needed a PA done. Said pt had called several times about the med saying he was running out. Got PA started and made pharmacy aware.

## 2020-01-18 MED FILL — TAMSULOSIN HCL 0.4 MG CAP: 0.4 | 30 days supply | Qty: 60 | Fill #0

## 2020-01-30 ENCOUNTER — Other Ambulatory Visit: Payer: Self-pay | Admitting: Urology

## 2020-01-31 ENCOUNTER — Other Ambulatory Visit (HOSPITAL_COMMUNITY): Payer: Self-pay | Admitting: Internal Medicine

## 2020-01-31 MED FILL — levETIRAcetam 750 MG TABS: 750 | 90 days supply | Qty: 180 | Fill #1

## 2020-01-31 MED FILL — ZOLPIDEM TARTRATE 5 MG TAB: 5 | 30 days supply | Qty: 30 | Fill #0

## 2020-02-05 ENCOUNTER — Other Ambulatory Visit (HOSPITAL_COMMUNITY): Payer: Self-pay | Admitting: Internal Medicine

## 2020-02-05 MED FILL — FLUTICASONE PROP 50 MCG SPR: 50 | 30 days supply | Qty: 16 | Fill #0

## 2020-02-05 MED FILL — AZELASTINE HCL 137 MCG SPRY: 0.1 | 50 days supply | Qty: 30 | Fill #1

## 2020-02-05 MED FILL — CARVEDILOL 12.5 MG TABLET: 12.5 | 30 days supply | Qty: 60 | Fill #2

## 2020-02-06 ENCOUNTER — Other Ambulatory Visit (HOSPITAL_COMMUNITY): Payer: Self-pay | Admitting: Internal Medicine

## 2020-02-06 MED FILL — AZITHROMYCIN 250 MG TABLET: 250 | 5 days supply | Qty: 6 | Fill #0

## 2020-02-06 MED FILL — MECLIZINE 25 MG TABLET: 25 | 17 days supply | Qty: 50 | Fill #0

## 2020-02-15 ENCOUNTER — Ambulatory Visit: Payer: Medicare Other | Admitting: Urology

## 2020-02-22 ENCOUNTER — Ambulatory Visit: Payer: Self-pay | Admitting: Cardiology

## 2020-02-22 ENCOUNTER — Encounter: Payer: Self-pay | Admitting: Urology

## 2020-02-22 ENCOUNTER — Other Ambulatory Visit: Payer: Self-pay

## 2020-02-22 ENCOUNTER — Other Ambulatory Visit: Payer: Self-pay | Admitting: Urology

## 2020-02-22 ENCOUNTER — Ambulatory Visit (INDEPENDENT_AMBULATORY_CARE_PROVIDER_SITE_OTHER): Payer: Medicare Other | Admitting: Urology

## 2020-02-22 VITALS — BP 138/79 | HR 79 | Temp 98.7°F | Ht 74.0 in | Wt 225.0 lb

## 2020-02-22 DIAGNOSIS — N401 Enlarged prostate with lower urinary tract symptoms: Secondary | ICD-10-CM

## 2020-02-22 DIAGNOSIS — R39198 Other difficulties with micturition: Secondary | ICD-10-CM | POA: Diagnosis not present

## 2020-02-22 DIAGNOSIS — N138 Other obstructive and reflux uropathy: Secondary | ICD-10-CM

## 2020-02-22 DIAGNOSIS — C61 Malignant neoplasm of prostate: Secondary | ICD-10-CM | POA: Diagnosis not present

## 2020-02-22 LAB — URINALYSIS, ROUTINE W REFLEX MICROSCOPIC
Bilirubin, UA: NEGATIVE
Glucose, UA: NEGATIVE
Ketones, UA: NEGATIVE
Leukocytes,UA: NEGATIVE
Nitrite, UA: NEGATIVE
Protein,UA: NEGATIVE
RBC, UA: NEGATIVE
Specific Gravity, UA: 1.02 (ref 1.005–1.030)
Urobilinogen, Ur: 0.2 mg/dL (ref 0.2–1.0)
pH, UA: 5.5 (ref 5.0–7.5)

## 2020-02-22 LAB — BLADDER SCAN AMB NON-IMAGING: Scan Result: 72

## 2020-02-22 MED ORDER — LEVOFLOXACIN 750 MG PO TABS: 750.0000 mg | ORAL_TABLET | Freq: Once | ORAL | 0 refills | Status: DC

## 2020-02-22 MED FILL — levoFLOXacin 750 MG TABS: 750 | 1 days supply | Qty: 1 | Fill #0

## 2020-02-22 MED FILL — AMLODIPINE BESYLATE 10 MG T: 10 | 90 days supply | Qty: 90 | Fill #1

## 2020-02-22 NOTE — Progress Notes (Signed)
02/22/2020 11:04 AM   Joel Duarte September 14, 1945 [id number]  Referring provider: Rogers Blocker, MD [place],  [place] [address]  Prostate cancer  HPI: [name] is a 75yo here for followup for prostate cancer and BPH with urinary retention. He denies any worsening LUTS since last visit. Stream strong, no hesitancy. No urgency or frequency. Overall he is happy with his urination. He is on flomax BID and finasteride. His PSA increased to 28.5 from 13.    PMH: Past Medical History:  Diagnosis Date  . Allergy   . Enlarged prostate   . Gout   . Hypertension   . Osteoarthrosis, unspecified whether generalized or localized, unspecified site   . Seizures ([place])    history of 1 a year ago    Surgical History: Past Surgical History:  Procedure Laterality Date  . no surgical h/o  June 2014    Home Medications:  Allergies as of 02/22/2020   No Known Allergies     Medication List       Accurate as of February 22, 2020 11:04 AM. If you have any questions, ask your nurse or doctor.        amLODipine 10 MG tablet Commonly known as: NORVASC Take 10 mg by mouth daily.   benazepril 40 MG tablet Commonly known as: LOTENSIN Take 40 mg by mouth daily.   carvedilol 12.5 MG tablet Commonly known as: COREG   diclofenac Sodium 1 % Gel Commonly known as: VOLTAREN   doxazosin 8 MG tablet Commonly known as: CARDURA Take 8 mg by mouth at bedtime.   finasteride 5 MG tablet Commonly known as: PROSCAR Take 1 tablet (5 mg total) by mouth daily.   fluticasone 50 MCG/ACT nasal spray Commonly known as: FLONASE Place into both nostrils daily.   furosemide 20 MG tablet Commonly known as: LASIX   levETIRAcetam 750 MG tablet Commonly known as: KEPPRA Take 1 tablet (750 mg total) by mouth 2 (two) times daily.   metoprolol succinate 100 MG 24 hr tablet Commonly known as: TOPROL-XL Take 100 mg by mouth daily. Take with or immediately following a meal.    naproxen 500 MG tablet Commonly known as: NAPROSYN Take 500 mg by mouth daily as needed.   sildenafil 100 MG tablet Commonly known as: VIAGRA TAKE 1 TABLET BY MOUTH 1 HOUR PRIOR TO SEXUAL ACTIVITY   sulfamethoxazole-trimethoprim 800-160 MG tablet Commonly known as: BACTRIM DS Take 1 tablet by mouth every 12 (twelve) hours.   tamsulosin 0.4 MG Caps capsule Commonly known as: FLOMAX Take 1 capsule (0.4 mg total) by mouth in the morning and at bedtime.   Vitamin D (Ergocalciferol) 1.25 MG (50000 UNIT) Caps capsule Commonly known as: DRISDOL Take 50,000 Units by mouth once a week.       Allergies: No Known Allergies  Family History: Family History  Problem Relation Age of Onset  . Hypertension Mother   . Healthy Sister   . Healthy Child     Social History:  reports that he has never smoked. He has never used smokeless tobacco. He reports current alcohol use. He reports that he does not use drugs.  ROS: All other review of systems were reviewed and are negative except what is noted above in HPI  Physical Exam: BP 138/79   Pulse 79   Temp 98.7 F (37.1 C)   Ht 6\' 2"  (1.88 m)   Wt 225 lb (102.1 kg)   BMI 28.89 kg/m   Constitutional:  Alert and oriented, No acute distress. HEENT: [place] AT, moist mucus membranes.  Trachea midline, no masses. Cardiovascular: No clubbing, cyanosis, or edema. Respiratory: Normal respiratory effort, no increased work of breathing. GI: Abdomen is soft, nontender, nondistended, no abdominal masses GU: No CVA tenderness.  Lymph: No cervical or inguinal lymphadenopathy. Skin: No rashes, bruises or suspicious lesions. Neurologic: Grossly intact, no focal deficits, moving all 4 extremities. Psychiatric: Normal mood and affect.  Laboratory Data: Lab Results  Component Value Date   WBC 10.6 (H) 08/06/2011   HGB 13.9 03/27/2013   HCT 41.0 03/27/2013   MCV 79.0 08/06/2011   PLT 158 08/06/2011    Lab Results  Component Value Date    CREATININE 1.20 03/27/2013    No results found for: PSA  No results found for: TESTOSTERONE  Lab Results  Component Value Date   HGBA1C 6.0 11/30/2011    Urinalysis    Component Value Date/Time   COLORURINE YELLOW 06/07/2011 0856   APPEARANCEUR Clear 12/21/2019 1318   LABSPEC 1.013 06/07/2011 0856   PHURINE 5.0 06/07/2011 0856   GLUCOSEU Negative 12/21/2019 1318   HGBUR TRACE (A) 06/07/2011 0856   BILIRUBINUR Negative 12/21/2019 1318   KETONESUR NEGATIVE 06/07/2011 0856   PROTEINUR 1+ (A) 12/21/2019 1318   PROTEINUR 30 (A) 06/07/2011 0856   UROBILINOGEN 0.2 06/07/2011 0856   NITRITE Negative 12/21/2019 1318   NITRITE NEGATIVE 06/07/2011 0856   LEUKOCYTESUR Negative 12/21/2019 1318    Lab Results  Component Value Date   LABMICR See below: 12/21/2019   WBCUA None seen 12/21/2019   LABEPIT 0-10 12/21/2019   MUCUS Present 12/21/2019   BACTERIA None seen 12/21/2019    Pertinent Imaging:  No results found for this or any previous visit.  No results found for this or any previous visit.  No results found for this or any previous visit.  No results found for this or any previous visit.  Results for orders placed during the hospital encounter of 12/19/19  US RENAL  Narrative CLINICAL DATA:  Progressive chronic renal failure, obstructive Rob a thick, hypertension  EXAM: RENAL / URINARY TRACT ULTRASOUND COMPLETE  COMPARISON:  None  FINDINGS: Right Kidney:  Renal measurements: 8.4 x 3.3 x 4.4 cm = volume: 69 mL. Cortical thinning. Probably slightly increased cortical echogenicity. No mass, hydronephrosis, or shadowing calcification.  Left Kidney:  Renal measurements: 11.3 x 6.4 x 5.5 cm = volume: 2 8 mL. Cortical thinning. Increased cortical echogenicity. Cyst at mid upper pole 3.2 x 3.1 x 4.1 cm, simple features. No additional mass, hydronephrosis, or shadowing calcification.  Bladder:  Appears normal for partial degree of bladder  distention.  Other:  Enlarged prostate gland 4.9 x 4.7 x 7.0 cm (volume = 84 cm^3) extending into base of urinary bladder  IMPRESSION: Cortical thinning and probable medical renal disease changes of both kidneys.  4.1 cm LEFT renal cyst.  No evidence of additional renal mass or hydronephrosis.  Prostatomegaly.   Electronically Signed By: Lavonia Dana M.D. On: 12/20/2019 10:53  No results found for this or any previous visit.  No results found for this or any previous visit.  No results found for this or any previous visit.   Assessment & Plan:    1. Benign prostatic hyperplasia with urinary obstruction -continue flomax BID and finasteride - Urinalysis, Routine w reflex microscopic - Bladder Scan (Post Void Residual) in office  2. Difficulty urinating -continue flomax BID an finasteride  3. Prostate cancer [facility]) We will schedule for prostate  biopsy. Rx for levaquin given   Return in about 4 weeks (around 03/21/2020) for prostate cancer.  Nicolette Bang, MD  [facility] [place]

## 2020-02-22 NOTE — Patient Instructions (Addendum)
[place] [facility] (11th ed., pp. [phone]). [place], [place]: Elsevier.">  Transrectal Ultrasound-Guided Prostate Biopsy This is a procedure to take samples of tissue from your prostate. Ultrasound images are used to guide the procedure. It is usually done to check for prostate cancer. What happens before the procedure? Eating and drinking restrictions Follow instructions from your doctor about what you can eat or drink. Medicines  Ask your doctor about changing or stopping: ? Your normal medicines. ? Vitamins, herbs, and supplements. ? Over-the-counter medicines.  Do not take aspirin or ibuprofen unless you are told to. General instructions  Liquid will be used to clear waste from your butt (enema).  You may have a blood sample taken.  You may have a pee (urine) sample taken.  Plan to have a responsible adult take you home from the hospital or clinic.  If you will be going home right after the procedure, plan to have a responsible adult care for you for the time you are told. This is important.  For your safety, your doctor may: ? Ask you to wash with a soap that kills germs. ? Give you antibiotic medicine. What happens during the procedure?  You will be given one or both of these: ? A medicine to help you relax. ? A medicine to numb the area.  You will be placed on your left side. Your knees may be bent.  A probe with gel on it will be placed in your butt. Pictures will be taken of your prostate and the area around it.  Medicine will be used to numb your prostate.  A needle will be placed in your butt and moved to your prostate.  Prostate tissue will be removed.  The samples will be sent to a lab. The procedure may vary.   What happens after the procedure?  You will be watched until the medicines you were given have worn off.  You may have some pain in your butt. You will be given medicine for it.  Do not drive for 24 hours if you were given a medicine  to help you relax. Summary  This procedure is usually done to check for prostate cancer.  Before the procedure, ask your doctor about changing or stopping your medicines.  You may have some pain in your butt. You will be given medicine for it.  Plan to have a responsible adult take you home from the hospital or clinic. This information is not intended to replace advice given to you by your health care provider. Make sure you discuss any questions you have with your health care provider. Document Revised: 10/19/2019 Document Reviewed: 09/21/2019 Elsevier Patient Education  [place].   Appointment Time: 12:30,  Arrive 12:15 Appointment Date: Feb. 14th, 2022  Location: [facility]   Prostate Biopsy Instructions  Stop all aspirin or blood thinners (aspirin, plavix, coumadin, warfarin, motrin, ibuprofen, advil, aleve, naproxen, naprosyn) for 7 days prior to the procedure.  If you have any questions about stopping these medications, please contact your primary care physician or cardiologist.  Having a light meal prior to the procedure is recommended.  If you are diabetic or have low blood sugar please bring a small snack or glucose tablet.  A Fleets enema is needed to be purchased over the counter at a local pharmacy and used 2 hours before you scheduled appointment.  This can be purchased over the counter at any pharmacy.  Antibiotics will be administered in the clinic at the time of  the procedure and 1 tablet has been sent to your pharmacy. Please take the antibiotic as prescribed.    Please bring someone with you to the procedure to drive you home if you are given a valium to take prior to your procedure.   If you have any questions or concerns, please feel free to call the office at [phone] or send a Mychart message.    Thank you, [facility]

## 2020-02-22 NOTE — Progress Notes (Signed)
Urological Symptom Review  Patient is experiencing the following symptoms: Frequent urination Get up at night to urinate   Review of Systems  Gastrointestinal (upper)  : Negative for upper GI symptoms  Gastrointestinal (lower) : Negative for lower GI symptoms  Constitutional : Negative for symptoms  Skin: Negative for skin symptoms  Eyes: Negative for eye symptoms  Ear/Nose/Throat : Sinus problems  Hematologic/Lymphatic: Negative for Hematologic/Lymphatic symptoms  Cardiovascular : Leg swelling  Respiratory : Cough Shortness of breath  Endocrine: Negative for endocrine symptoms  Musculoskeletal: Negative for musculoskeletal symptoms  Neurological: Negative for neurological symptoms  Psychologic: Negative for psychiatric symptoms

## 2020-02-25 MED FILL — TAMSULOSIN HCL 0.4 MG CAP: 0.4 | 30 days supply | Qty: 60 | Fill #1

## 2020-03-03 ENCOUNTER — Ambulatory Visit (HOSPITAL_COMMUNITY)
Admission: RE | Admit: 2020-03-03 | Discharge: 2020-03-03 | Disposition: A | Discharge status: Home / Self Care | Payer: Medicare Other | Source: Ambulatory Visit | Attending: Urology | Admitting: Urology

## 2020-03-03 ENCOUNTER — Other Ambulatory Visit: Payer: Self-pay | Admitting: Urology

## 2020-03-03 ENCOUNTER — Encounter: Payer: Self-pay | Admitting: Urology

## 2020-03-03 ENCOUNTER — Ambulatory Visit (INDEPENDENT_AMBULATORY_CARE_PROVIDER_SITE_OTHER): Payer: Medicare Other | Admitting: Urology

## 2020-03-03 ENCOUNTER — Encounter (HOSPITAL_COMMUNITY): Payer: Self-pay

## 2020-03-03 ENCOUNTER — Other Ambulatory Visit: Payer: Self-pay

## 2020-03-03 DIAGNOSIS — C61 Malignant neoplasm of prostate: Secondary | ICD-10-CM

## 2020-03-03 MED ORDER — GENTAMICIN SULFATE 40 MG/ML IJ SOLN
INTRAMUSCULAR | Status: AC
  Administered 2020-03-03: 80 mg via INTRAMUSCULAR
  Filled 2020-03-03: qty 2

## 2020-03-03 MED ORDER — LIDOCAINE HCL (PF) 2 % IJ SOLN
10.0000 mL | Freq: Once | INTRAMUSCULAR | Status: AC
  Administered 2020-03-03: 10 mL

## 2020-03-03 MED ORDER — GENTAMICIN SULFATE 40 MG/ML IJ SOLN: 80.0000 mg | Freq: Once | INTRAMUSCULAR | Status: AC

## 2020-03-03 NOTE — Sedation Documentation (Signed)
PT tolerated prostate biopsy procedure and IM antibiotic injection well today. Labs obtained and sent for pathology. PT ambulatory at discharge with no acute distress noted and verbalized understanding of discharge instructions. PT to follow up with urologist.

## 2020-03-03 NOTE — Patient Instructions (Signed)

## 2020-03-03 NOTE — Progress Notes (Signed)
Prostate Biopsy Procedure   Informed consent was obtained after discussing risks/benefits of the procedure.  A time out was performed to ensure correct patient identity.  Pre-Procedure: - Last PSA Level: No results found for: PSA - Gentamicin given prophylactically - Levaquin 500 mg administered PO -Transrectal Ultrasound performed revealing a 250.7 gm prostate -No significant hypoechoic or median lobe noted  Procedure: - Prostate block performed using 10 cc 1% lidocaine and biopsies taken from sextant areas, a total of 12 under ultrasound guidance.  Post-Procedure: - Patient tolerated the procedure well - He was counseled to seek immediate medical attention if experiences any severe pain, significant bleeding, or fevers - Return in one week to discuss biopsy results

## 2020-03-03 NOTE — Discharge Instructions (Signed)

## 2020-03-12 MED FILL — DOXAZOSIN MESYLATE 8 MG TAB: 8 | 90 days supply | Qty: 135 | Fill #2

## 2020-03-17 ENCOUNTER — Ambulatory Visit (INDEPENDENT_AMBULATORY_CARE_PROVIDER_SITE_OTHER): Payer: Medicare Other | Admitting: Urology

## 2020-03-17 ENCOUNTER — Other Ambulatory Visit: Payer: Self-pay

## 2020-03-17 ENCOUNTER — Encounter: Payer: Self-pay | Admitting: Urology

## 2020-03-17 VITALS — BP 127/70 | HR 98 | Temp 98.4°F | Ht 74.0 in | Wt 225.0 lb

## 2020-03-17 DIAGNOSIS — C61 Malignant neoplasm of prostate: Secondary | ICD-10-CM

## 2020-03-17 MED ORDER — FINASTERIDE 5 MG PO TABS: 5.0000 mg | ORAL_TABLET | Freq: Every day | ORAL | 3 refills | Status: DC

## 2020-03-17 MED ORDER — TAMSULOSIN HCL 0.4 MG PO CAPS: 0.4000 mg | ORAL_CAPSULE | Freq: Every day | ORAL | 3 refills | Status: DC

## 2020-03-17 MED FILL — FINASTERIDE 5 MG TABLET: 5 | 90 days supply | Qty: 90 | Fill #0

## 2020-03-17 NOTE — Progress Notes (Signed)
03/17/2020 4:50 PM   Joel Duarte 02/19/1945 [id number]  Referring provider: Rogers Blocker, MD [place],  [place] [address]  Followup prostate biopsy  HPI: Joel Duarte is 75yo here for followup after prostate biopsy. Biopsy revealed Gleason 3+4=7 in 1/12 cores 5% of cores and Gleason 3+3=6 in 1/12 cores 3% of core. No worsening LUTS. No hematuria   PMH: Past Medical History:  Diagnosis Date  . Allergy   . Enlarged prostate   . Gout   . Hypertension   . Osteoarthrosis, unspecified whether generalized or localized, unspecified site   . Seizures ([place])    history of 1 a year ago    Surgical History: Past Surgical History:  Procedure Laterality Date  . no surgical h/o  June 2014    Home Medications:  Allergies as of 03/17/2020   No Known Allergies     Medication List       Accurate as of March 17, 2020  4:50 PM. If you have any questions, ask your nurse or doctor.        amLODipine 10 MG tablet Commonly known as: NORVASC Take 10 mg by mouth daily.   benazepril 40 MG tablet Commonly known as: LOTENSIN Take 40 mg by mouth daily.   carvedilol 12.5 MG tablet Commonly known as: COREG   diclofenac Sodium 1 % Gel Commonly known as: VOLTAREN   doxazosin 8 MG tablet Commonly known as: CARDURA Take 8 mg by mouth at bedtime.   finasteride 5 MG tablet Commonly known as: PROSCAR Take 1 tablet (5 mg total) by mouth daily.   fluticasone 50 MCG/ACT nasal spray Commonly known as: FLONASE Place into both nostrils daily.   furosemide 20 MG tablet Commonly known as: LASIX   levETIRAcetam 750 MG tablet Commonly known as: KEPPRA Take 1 tablet (750 mg total) by mouth 2 (two) times daily.   metoprolol succinate 100 MG 24 hr tablet Commonly known as: TOPROL-XL Take 100 mg by mouth daily. Take with or immediately following a meal.   naproxen 500 MG tablet Commonly known as: NAPROSYN Take 500 mg by mouth daily as needed.   sildenafil  100 MG tablet Commonly known as: VIAGRA TAKE 1 TABLET BY MOUTH 1 HOUR PRIOR TO SEXUAL ACTIVITY   sulfamethoxazole-trimethoprim 800-160 MG tablet Commonly known as: BACTRIM DS Take 1 tablet by mouth every 12 (twelve) hours.   tamsulosin 0.4 MG Caps capsule Commonly known as: FLOMAX Take 1 capsule (0.4 mg total) by mouth in the morning and at bedtime.   Vitamin D (Ergocalciferol) 1.25 MG (50000 UNIT) Caps capsule Commonly known as: DRISDOL Take 50,000 Units by mouth once a week.       Allergies: No Known Allergies  Family History: Family History  Problem Relation Age of Onset  . Hypertension Mother   . Healthy Sister   . Healthy Child     Social History:  reports that he has never smoked. He has never used smokeless tobacco. He reports current alcohol use. He reports that he does not use drugs.  ROS: All other review of systems were reviewed and are negative except what is noted above in HPI  Physical Exam: There were no vitals taken for this visit.  Constitutional:  Alert and oriented, No acute distress. HEENT: [place] AT, moist mucus membranes.  Trachea midline, no masses. Cardiovascular: No clubbing, cyanosis, or edema. Respiratory: Normal respiratory effort, no increased work of breathing. GI: Abdomen is soft, nontender, nondistended, no abdominal masses GU:  No CVA tenderness.  Lymph: No cervical or inguinal lymphadenopathy. Skin: No rashes, bruises or suspicious lesions. Neurologic: Grossly intact, no focal deficits, moving all 4 extremities. Psychiatric: Normal mood and affect.  Laboratory Data: Lab Results  Component Value Date   WBC 10.6 (H) 08/06/2011   HGB 13.9 03/27/2013   HCT 41.0 03/27/2013   MCV 79.0 08/06/2011   PLT 158 08/06/2011    Lab Results  Component Value Date   CREATININE 1.20 03/27/2013    No results found for: PSA  No results found for: TESTOSTERONE  Lab Results  Component Value Date   HGBA1C 6.0 11/30/2011    Urinalysis     Component Value Date/Time   COLORURINE YELLOW 06/07/2011 0856   APPEARANCEUR Clear 02/22/2020 1027   LABSPEC 1.013 06/07/2011 0856   PHURINE 5.0 06/07/2011 0856   GLUCOSEU Negative 02/22/2020 1027   HGBUR TRACE (A) 06/07/2011 0856   BILIRUBINUR Negative 02/22/2020 1027   KETONESUR NEGATIVE 06/07/2011 0856   PROTEINUR Negative 02/22/2020 1027   PROTEINUR 30 (A) 06/07/2011 0856   UROBILINOGEN 0.2 06/07/2011 0856   NITRITE Negative 02/22/2020 1027   NITRITE NEGATIVE 06/07/2011 0856   LEUKOCYTESUR Negative 02/22/2020 1027    Lab Results  Component Value Date   LABMICR Comment 02/22/2020   WBCUA None seen 12/21/2019   LABEPIT 0-10 12/21/2019   MUCUS Present 12/21/2019   BACTERIA None seen 12/21/2019    Pertinent Imaging:  No results found for this or any previous visit.  No results found for this or any previous visit.  No results found for this or any previous visit.  No results found for this or any previous visit.  Results for orders placed during the hospital encounter of 12/19/19  US RENAL  Narrative CLINICAL DATA:  Progressive chronic renal failure, obstructive Rob a thick, hypertension  EXAM: RENAL / URINARY TRACT ULTRASOUND COMPLETE  COMPARISON:  None  FINDINGS: Right Kidney:  Renal measurements: 8.4 x 3.3 x 4.4 cm = volume: 69 mL. Cortical thinning. Probably slightly increased cortical echogenicity. No mass, hydronephrosis, or shadowing calcification.  Left Kidney:  Renal measurements: 11.3 x 6.4 x 5.5 cm = volume: 2 8 mL. Cortical thinning. Increased cortical echogenicity. Cyst at mid upper pole 3.2 x 3.1 x 4.1 cm, simple features. No additional mass, hydronephrosis, or shadowing calcification.  Bladder:  Appears normal for partial degree of bladder distention.  Other:  Enlarged prostate gland 4.9 x 4.7 x 7.0 cm (volume = 84 cm^3) extending into base of urinary bladder  IMPRESSION: Cortical thinning and probable medical renal disease  changes of both kidneys.  4.1 cm LEFT renal cyst.  No evidence of additional renal mass or hydronephrosis.  Prostatomegaly.   Electronically Signed By: Lavonia Dana M.D. On: 12/20/2019 10:53  No results found for this or any previous visit.  No results found for this or any previous visit.  No results found for this or any previous visit.   Assessment & Plan:    1. Prostate cancer [facility]) I discussed the natural history of favorable intermediate risk prostate cancer with the patient and the various treatment options including active surveillance, RALP, IMRT, brachytherapy, cryotherapy, HIFU and ADT. After discussing the options the patient elects for active surveillance.    No follow-ups on file.  Nicolette Bang, MD  [facility] [place]

## 2020-03-17 NOTE — Patient Instructions (Signed)
Prostate Cancer  The prostate is a male gland that helps make semen. It is located below a man's bladder, in front of the rectum. Prostate cancer is when abnormal cells grow in this gland. What are the causes? The cause of this condition is not known. What increases the risk? You are more likely to develop this condition if:  You are 74 years of age or older.  You are African American.  You have a family history of prostate cancer.  You have a family history of breast cancer. What are the signs or symptoms? Symptoms of this condition include:  A need to pee often.  Peeing that is weak, or pee that stops and starts.  Trouble starting or stopping your pee.  Inability to pee.  Blood in your pee or semen.  Pain in the lower back, lower belly (abdomen), hips, or upper thighs.  Trouble getting an erection.  Trouble emptying all of your pee. How is this treated? Treatment for this condition depends on your age, your health, the kind of treatment you like, and how far the cancer has spread. Treatments include:  Being watched. This is called observation. You will be tested from time to time, but you will not get treated. Tests are to make sure that the cancer is not growing.  Surgery. This may be done to remove the prostate, to remove the testicles, or to freeze or kill cancer cells.  Radiation. This uses a strong beam to kill cancer cells.  Ultrasound energy. This uses strong sound waves to kill cancer cells.  Chemotherapy. This uses medicines that stop cancer cells from increasing. This kills cancer cells and healthy cells.  Targeted therapy. This kills cancer cells only. Healthy cells are not affected.  Hormone treatment. This stops the body from making hormones that help the cancer cells to grow. Follow these instructions at home:  Take over-the-counter and prescription medicines only as told by your doctor.  Eat a healthy diet.  Get plenty of sleep.  Ask your  doctor for help to find a support group for men with prostate cancer.  If you have to go to the hospital, let your cancer doctor (oncologist) know.  Treatment may affect your ability to have sex. Touch, hold, hug, and caress your partner to have intimate moments.  Keep all follow-up visits as told by your doctor. This is important. Contact a doctor if:  You have new or more trouble peeing.  You have new or more blood in your pee.  You have new or more pain in your hips, back, or chest. Get help right away if:  You have weakness in your legs.  You lose feeling in your legs.  You cannot control your pee or your poop (stool).  You have chills or a fever. Summary  The prostate is a male gland that helps make semen.  Prostate cancer is when abnormal cells grow in this gland.  Treatment includes doing surgery, using medicines, using very strong beams, or watching without treatment.  Ask your doctor for help to find a support group for men with prostate cancer.  Contact a doctor if you have problems peeing or have any new pain that you did not have before. This information is not intended to replace advice given to you by your health care provider. Make sure you discuss any questions you have with your health care provider. Document Revised: 12/19/2018 Document Reviewed: 12/19/2018 Elsevier Patient Education  2021 Elsevier Inc.  

## 2020-03-19 ENCOUNTER — Other Ambulatory Visit (HOSPITAL_COMMUNITY): Payer: Self-pay | Admitting: Internal Medicine

## 2020-03-19 MED FILL — CARVEDILOL 12.5 MG TABLET: 12.5 | 30 days supply | Qty: 60 | Fill #0

## 2020-03-26 ENCOUNTER — Ambulatory Visit: Payer: Medicare Other | Admitting: Urology

## 2020-03-26 MED FILL — ALLOPURINOL 100 MG TABS: 100 | 90 days supply | Qty: 90 | Fill #2

## 2020-03-27 MED FILL — TAMSULOSIN HCL 0.4 MG CAP: 0.4 | 90 days supply | Qty: 90 | Fill #0

## 2020-04-03 ENCOUNTER — Other Ambulatory Visit (HOSPITAL_COMMUNITY): Payer: Self-pay | Admitting: Internal Medicine

## 2020-04-15 ENCOUNTER — Other Ambulatory Visit (HOSPITAL_COMMUNITY): Payer: Self-pay | Admitting: Internal Medicine

## 2020-04-21 MED FILL — Levetiracetam Tab 750 MG: ORAL | 90 days supply | Qty: 180 | Fill #0 | Status: AC

## 2020-04-22 ENCOUNTER — Other Ambulatory Visit (HOSPITAL_COMMUNITY): Payer: Self-pay

## 2020-04-23 ENCOUNTER — Other Ambulatory Visit: Payer: Self-pay

## 2020-04-23 ENCOUNTER — Other Ambulatory Visit: Payer: Self-pay | Admitting: Internal Medicine

## 2020-04-23 ENCOUNTER — Other Ambulatory Visit (HOSPITAL_COMMUNITY): Payer: Self-pay

## 2020-04-23 ENCOUNTER — Ambulatory Visit
Admission: RE | Admit: 2020-04-23 | Discharge: 2020-04-23 | Disposition: A | Discharge status: Home / Self Care | Payer: Medicare Other | Source: Ambulatory Visit | Attending: Internal Medicine | Admitting: Internal Medicine

## 2020-04-23 ENCOUNTER — Other Ambulatory Visit (HOSPITAL_COMMUNITY): Payer: Self-pay | Admitting: Internal Medicine

## 2020-04-23 DIAGNOSIS — I517 Cardiomegaly: Secondary | ICD-10-CM

## 2020-04-23 DIAGNOSIS — R0989 Other specified symptoms and signs involving the circulatory and respiratory systems: Secondary | ICD-10-CM

## 2020-04-23 MED ORDER — AZITHROMYCIN 250 MG PO TABS
ORAL_TABLET | ORAL | 0 refills | Status: DC
  Filled 2020-04-23: qty 6, 5d supply, fill #0

## 2020-04-24 ENCOUNTER — Other Ambulatory Visit (HOSPITAL_COMMUNITY): Payer: Self-pay

## 2020-05-06 MED FILL — Carvedilol Tab 12.5 MG: ORAL | 30 days supply | Qty: 60 | Fill #0 | Status: AC

## 2020-05-07 ENCOUNTER — Other Ambulatory Visit (HOSPITAL_COMMUNITY): Payer: Self-pay

## 2020-05-07 MED ORDER — LORATADINE 10 MG PO TABS
10.0000 mg | ORAL_TABLET | Freq: Every day | ORAL | 3 refills | Status: DC
  Filled 2020-05-07: qty 30, 30d supply, fill #0

## 2020-05-07 MED ORDER — AZITHROMYCIN 250 MG PO TABS
ORAL_TABLET | ORAL | 0 refills | Status: DC
  Filled 2020-05-07: qty 6, 5d supply, fill #0

## 2020-05-08 ENCOUNTER — Other Ambulatory Visit (HOSPITAL_COMMUNITY): Payer: Self-pay

## 2020-05-20 MED FILL — Amlodipine Besylate Tab 10 MG (Base Equivalent): ORAL | 90 days supply | Qty: 90 | Fill #0 | Status: AC

## 2020-05-21 ENCOUNTER — Other Ambulatory Visit (HOSPITAL_COMMUNITY): Payer: Self-pay

## 2020-06-04 ENCOUNTER — Other Ambulatory Visit (HOSPITAL_COMMUNITY): Payer: Self-pay

## 2020-06-04 MED FILL — Carvedilol Tab 12.5 MG: ORAL | 30 days supply | Qty: 60 | Fill #1 | Status: AC

## 2020-06-12 ENCOUNTER — Other Ambulatory Visit (HOSPITAL_COMMUNITY): Payer: Self-pay

## 2020-06-12 ENCOUNTER — Other Ambulatory Visit (HOSPITAL_COMMUNITY): Payer: Self-pay | Admitting: Internal Medicine

## 2020-06-12 MED ORDER — SILDENAFIL CITRATE 100 MG PO TABS
100.0000 mg | ORAL_TABLET | ORAL | 3 refills | Status: DC
  Filled 2020-06-12: qty 30, 30d supply, fill #0

## 2020-06-12 MED ORDER — DOXAZOSIN MESYLATE 8 MG PO TABS
ORAL_TABLET | ORAL | 2 refills | Status: DC
  Filled 2020-06-12: qty 135, 90d supply, fill #0
  Filled 2020-09-03: qty 135, 90d supply, fill #1
  Filled 2020-12-18: qty 135, 90d supply, fill #2

## 2020-06-13 ENCOUNTER — Other Ambulatory Visit (HOSPITAL_COMMUNITY): Payer: Self-pay

## 2020-06-19 MED FILL — Furosemide Tab 20 MG: ORAL | 90 days supply | Qty: 90 | Fill #0 | Status: AC

## 2020-06-19 MED FILL — Finasteride Tab 5 MG: ORAL | 90 days supply | Qty: 90 | Fill #0 | Status: AC

## 2020-06-20 ENCOUNTER — Other Ambulatory Visit (HOSPITAL_COMMUNITY): Payer: Self-pay

## 2020-06-24 ENCOUNTER — Other Ambulatory Visit (HOSPITAL_COMMUNITY): Payer: Self-pay | Admitting: Internal Medicine

## 2020-06-24 MED FILL — Benazepril HCl Tab 40 MG: ORAL | 90 days supply | Qty: 90 | Fill #0 | Status: AC

## 2020-06-25 ENCOUNTER — Other Ambulatory Visit (HOSPITAL_COMMUNITY): Payer: Self-pay

## 2020-06-26 ENCOUNTER — Other Ambulatory Visit (HOSPITAL_COMMUNITY): Payer: Self-pay

## 2020-06-27 ENCOUNTER — Other Ambulatory Visit (HOSPITAL_COMMUNITY): Payer: Self-pay

## 2020-06-27 MED ORDER — ALLOPURINOL 100 MG PO TABS
100.0000 mg | ORAL_TABLET | Freq: Every day | ORAL | 2 refills | Status: DC
  Filled 2020-06-27: qty 90, 90d supply, fill #0
  Filled 2020-09-23: qty 90, 90d supply, fill #1
  Filled 2020-12-24: qty 90, 90d supply, fill #2

## 2020-07-01 ENCOUNTER — Other Ambulatory Visit (HOSPITAL_COMMUNITY): Payer: Self-pay

## 2020-07-01 MED ORDER — HYDRALAZINE HCL 50 MG PO TABS
50.0000 mg | ORAL_TABLET | Freq: Two times a day (BID) | ORAL | 2 refills | Status: DC
  Filled 2020-07-01: qty 180, 90d supply, fill #0

## 2020-07-02 ENCOUNTER — Other Ambulatory Visit (HOSPITAL_COMMUNITY): Payer: Self-pay | Admitting: Internal Medicine

## 2020-07-04 ENCOUNTER — Other Ambulatory Visit (HOSPITAL_COMMUNITY): Payer: Self-pay

## 2020-07-04 MED ORDER — CARVEDILOL 12.5 MG PO TABS
12.5000 mg | ORAL_TABLET | Freq: Two times a day (BID) | ORAL | 2 refills | Status: DC
  Filled 2020-07-04: qty 180, 90d supply, fill #0

## 2020-07-24 ENCOUNTER — Other Ambulatory Visit (HOSPITAL_COMMUNITY): Payer: Self-pay

## 2020-07-24 MED ORDER — NIFEDIPINE 10 MG PO CAPS
10.0000 mg | ORAL_CAPSULE | Freq: Two times a day (BID) | ORAL | 1 refills | Status: DC
Start: 2020-07-23 — End: 2020-07-25
  Filled 2020-07-24: qty 60, 30d supply, fill #0

## 2020-07-25 ENCOUNTER — Other Ambulatory Visit (HOSPITAL_COMMUNITY): Payer: Self-pay

## 2020-07-25 MED ORDER — LOSARTAN POTASSIUM 100 MG PO TABS
100.0000 mg | ORAL_TABLET | Freq: Every day | ORAL | 3 refills | Status: DC
  Filled 2020-07-25 (×2): qty 90, 90d supply, fill #0
  Filled 2020-10-21: qty 90, 90d supply, fill #1
  Filled 2021-01-01: qty 90, 90d supply, fill #2

## 2020-07-25 MED ORDER — CARVEDILOL 25 MG PO TABS
25.0000 mg | ORAL_TABLET | Freq: Two times a day (BID) | ORAL | 2 refills | Status: DC
  Filled 2020-07-25: qty 180, 90d supply, fill #0
  Filled 2020-10-21: qty 180, 90d supply, fill #1
  Filled 2021-01-21: qty 180, 90d supply, fill #2

## 2020-07-25 MED ORDER — AMLODIPINE BESYLATE 5 MG PO TABS
5.0000 mg | ORAL_TABLET | Freq: Every day | ORAL | 3 refills | Status: DC
  Filled 2020-07-25: qty 90, 90d supply, fill #0
  Filled 2020-10-21: qty 90, 90d supply, fill #1

## 2020-07-28 ENCOUNTER — Other Ambulatory Visit (HOSPITAL_COMMUNITY): Payer: Self-pay

## 2020-08-18 MED FILL — Levetiracetam Tab 750 MG: ORAL | 90 days supply | Qty: 180 | Fill #1 | Status: AC

## 2020-08-19 ENCOUNTER — Other Ambulatory Visit (HOSPITAL_COMMUNITY): Payer: Self-pay

## 2020-08-19 MED ORDER — AZITHROMYCIN 250 MG PO TABS
ORAL_TABLET | ORAL | 0 refills | Status: DC
  Filled 2020-08-19: qty 6, 5d supply, fill #0

## 2020-08-28 ENCOUNTER — Other Ambulatory Visit: Payer: Self-pay

## 2020-09-03 ENCOUNTER — Other Ambulatory Visit (HOSPITAL_COMMUNITY): Payer: Self-pay | Admitting: Internal Medicine

## 2020-09-03 ENCOUNTER — Other Ambulatory Visit (HOSPITAL_COMMUNITY): Payer: Self-pay

## 2020-09-05 ENCOUNTER — Other Ambulatory Visit (HOSPITAL_COMMUNITY): Payer: Self-pay

## 2020-09-08 ENCOUNTER — Other Ambulatory Visit (HOSPITAL_COMMUNITY): Payer: Self-pay | Admitting: Internal Medicine

## 2020-09-08 ENCOUNTER — Other Ambulatory Visit (HOSPITAL_COMMUNITY): Payer: Self-pay

## 2020-09-09 ENCOUNTER — Other Ambulatory Visit (HOSPITAL_COMMUNITY): Payer: Self-pay

## 2020-09-11 ENCOUNTER — Other Ambulatory Visit (HOSPITAL_COMMUNITY): Payer: Self-pay

## 2020-09-11 MED ORDER — FUROSEMIDE 20 MG PO TABS
20.0000 mg | ORAL_TABLET | Freq: Every day | ORAL | 1 refills | Status: DC
  Filled 2020-09-11: qty 90, 90d supply, fill #0
  Filled 2020-11-06: qty 90, 90d supply, fill #1

## 2020-09-17 ENCOUNTER — Ambulatory Visit: Payer: Medicare Other | Admitting: Urology

## 2020-09-23 ENCOUNTER — Other Ambulatory Visit (HOSPITAL_COMMUNITY): Payer: Self-pay

## 2020-09-23 MED FILL — Finasteride Tab 5 MG: ORAL | 90 days supply | Qty: 90 | Fill #1 | Status: AC

## 2020-10-01 ENCOUNTER — Other Ambulatory Visit: Payer: Self-pay

## 2020-10-01 ENCOUNTER — Other Ambulatory Visit (HOSPITAL_COMMUNITY): Payer: Self-pay

## 2020-10-01 ENCOUNTER — Telehealth (INDEPENDENT_AMBULATORY_CARE_PROVIDER_SITE_OTHER): Payer: Medicare Other | Admitting: Urology

## 2020-10-01 ENCOUNTER — Encounter: Payer: Self-pay | Admitting: Urology

## 2020-10-01 DIAGNOSIS — C61 Malignant neoplasm of prostate: Secondary | ICD-10-CM

## 2020-10-01 DIAGNOSIS — N401 Enlarged prostate with lower urinary tract symptoms: Secondary | ICD-10-CM

## 2020-10-01 DIAGNOSIS — R39198 Other difficulties with micturition: Secondary | ICD-10-CM

## 2020-10-01 DIAGNOSIS — N138 Other obstructive and reflux uropathy: Secondary | ICD-10-CM

## 2020-10-01 MED ORDER — DUTASTERIDE 0.5 MG PO CAPS
0.5000 mg | ORAL_CAPSULE | Freq: Every day | ORAL | 3 refills | Status: DC
  Filled 2020-10-01: qty 90, 90d supply, fill #0

## 2020-10-01 NOTE — Progress Notes (Signed)
10/01/2020 9:13 AM   [name] 12/01/1945 [id number]  Referring provider: Rogers Blocker, [address] [place],  [place] [id number]  Patient location: home Physician location: office I connected with  Joel Duarte on 10/01/20 by a video enabled telemedicine application and verified that I am speaking with the correct person using two identifiers.   I discussed the limitations of evaluation and management by telemedicine. The patient expressed understanding and agreed to proceed.    Followup prostate cancer and BPH   HPI: [name] is a 75yo here for followup for prostate cancer and BPH. PSA decreased to 21 from 28. He stopped the flomax since last visit due to nasal congestion and headaches. His urine stream is strong. Nocturia 2x. No straining to urinate. He is very happy with progress on finasteride. No other complaints today.    PMH: Past Medical History:  Diagnosis Date   Allergy    Enlarged prostate    Gout    Hypertension    Osteoarthrosis, unspecified whether generalized or localized, unspecified site    Seizures ([place])    history of 1 a year ago    Surgical History: Past Surgical History:  Procedure Laterality Date   no surgical h/o  June 2014    Home Medications:  Allergies as of 10/01/2020   No Known Allergies      Medication List        Accurate as of October 01, 2020  9:13 AM. If you have any questions, ask your nurse or doctor.          allopurinol 100 MG tablet Commonly known as: ZYLOPRIM TAKE 1 TABLET BY MOUTH ONCE DAILY   allopurinol 100 MG tablet Commonly known as: ZYLOPRIM Take 1 tablet (100 mg total) by mouth daily.   amLODipine 10 MG tablet Commonly known as: NORVASC Take 10 mg by mouth daily.   amLODipine 10 MG tablet Commonly known as: NORVASC TAKE 1 TABLET BY MOUTH ONCE DAILY.   amLODipine 5 MG tablet Commonly known as: NORVASC Take 1 tablet (5 mg total) by mouth daily.   amoxicillin-clavulanate  500-125 MG tablet Commonly known as: AUGMENTIN TAKE 1 TABLET BY MOUTH 2 TIMES A DAY   azelastine 0.1 % nasal spray Commonly known as: ASTELIN USE ONE PUFF IN EACH NOSTRIL TWICE A DAY AS NEEDED   azithromycin 250 MG tablet Commonly known as: ZITHROMAX TAKE 2 TABLETS BY MOUTH ON DAY 1 THEN TAKE 1 TABLET DAILY FOR THE NEXT 4 DAYS   azithromycin 250 MG tablet Commonly known as: ZITHROMAX TAKE 2 TABLETS BY MOUTH ON DAY 1 THEN TAKE 1 TABLET DAILY FOR THE NEXT 4 DAYS   azithromycin 250 MG tablet Commonly known as: ZITHROMAX Take 2 tablets by mouth on day 1, then one tablet daily thereafter   azithromycin 250 MG tablet Commonly known as: ZITHROMAX Take 2 tablets by mouth on day 1, then take one daily   carvedilol 25 MG tablet Commonly known as: COREG Take 1 tablet (25 mg total) by mouth 2 (two) times daily.   diclofenac Sodium 1 % Gel Commonly known as: VOLTAREN   diclofenac Sodium 1 % Gel Commonly known as: VOLTAREN APPLY 5 GM TO AFFECTED AREA TWICE A DAY   doxazosin 8 MG tablet Commonly known as: CARDURA Take 8 mg by mouth at bedtime.   doxazosin 8 MG tablet Commonly known as: CARDURA TAKE 1 TABLET BY MOUTH IN MORNING AND 1/2 TABLET IN THE EVENING   doxazosin 8  MG tablet Commonly known as: CARDURA Take 1 tablet by mouth in the morning & 1/2 tab at bedtime   finasteride 5 MG tablet Commonly known as: PROSCAR TAKE 1 TABLET (5 MG TOTAL) BY MOUTH DAILY.   fluticasone 50 MCG/ACT nasal spray Commonly known as: FLONASE USE 1 SPRAY INTO EACH NOSTRIL TWO TIMES DAILY AS NEEDED   fluticasone 50 MCG/ACT nasal spray Commonly known as: FLONASE Place into both nostrils daily.   furosemide 20 MG tablet Commonly known as: LASIX   furosemide 20 MG tablet Commonly known as: LASIX Take 1 tablet (20 mg total) by mouth daily.   hydrALAZINE 50 MG tablet Commonly known as: APRESOLINE Take 1 tablet (50 mg total) by mouth 2 (two) times daily.   levETIRAcetam 750 MG  tablet Commonly known as: KEPPRA TAKE 1 TABLET (750 MG TOTAL) BY MOUTH 2 (TWO) TIMES DAILY.   levofloxacin 750 MG tablet Commonly known as: LEVAQUIN TAKE 1 TABLET (750 MG TOTAL) BY MOUTH ONCE FOR 1 DOSE. TAKE THE MORNING OF YOUR PROCEDURE   loratadine 10 MG tablet Commonly known as: CLARITIN Take 1 tablet by mouth daily   losartan 100 MG tablet Commonly known as: COZAAR TAKE 1 TABLET BY MOUTH ONCE DAILY   losartan 100 MG tablet Commonly known as: COZAAR Take 1 tablet (100 mg total) by mouth daily.   meclizine 25 MG tablet Commonly known as: ANTIVERT TAKE 1 TABLET 3 TIMES A DAY AS NEEDED FOR DIZZINESS.   metoprolol succinate 100 MG 24 hr tablet Commonly known as: TOPROL-XL Take 100 mg by mouth daily. Take with or immediately following a meal.   naproxen 500 MG tablet Commonly known as: NAPROSYN Take 500 mg by mouth daily as needed.   sildenafil 100 MG tablet Commonly known as: VIAGRA TAKE 1 TABLET BY MOUTH 1 HOUR PRIOR TO SEXUAL ACTIVITY   sildenafil 100 MG tablet Commonly known as: VIAGRA Take 1 tablet by mouth 2 hours before sexual activity, once daily at the most   sulfamethoxazole-trimethoprim 800-160 MG tablet Commonly known as: BACTRIM DS TAKE 1 TABLET BY MOUTH EVERY 12 HOURS.   tamsulosin 0.4 MG Caps capsule Commonly known as: FLOMAX TAKE 1 CAPSULE BY MOUTH TWICE DAILY.   tamsulosin 0.4 MG Caps capsule Commonly known as: FLOMAX TAKE 1 CAPSULE (0.4 MG TOTAL) BY MOUTH DAILY AFTER SUPPER.   Vitamin D (Ergocalciferol) 1.25 MG (50000 UNIT) Caps capsule Commonly known as: DRISDOL Take 50,000 Units by mouth once a week.   zolpidem 5 MG tablet Commonly known as: AMBIEN TAKE 1 TABLET BY MOUTH AT BEDTIME AS NEEDED FOR SLEEP        Allergies: No Known Allergies  Family History: Family History  Problem Relation Age of Onset   Hypertension Mother    Healthy Sister    Healthy Child     Social History:  reports that he has never smoked. He has never  used smokeless tobacco. He reports current alcohol use. He reports that he does not use drugs.  ROS: All other review of systems were reviewed and are negative except what is noted above in HPI   Laboratory Data: Lab Results  Component Value Date   WBC 10.6 (H) 08/06/2011   HGB 13.9 03/27/2013   HCT 41.0 03/27/2013   MCV 79.0 08/06/2011   PLT 158 08/06/2011    Lab Results  Component Value Date   CREATININE 1.20 03/27/2013    No results found for: PSA  No results found for: TESTOSTERONE  Lab Results  Component Value Date   HGBA1C 6.0 11/30/2011    Urinalysis    Component Value Date/Time   COLORURINE YELLOW 06/07/2011 0856   APPEARANCEUR Clear 02/22/2020 1027   LABSPEC 1.013 06/07/2011 0856   PHURINE 5.0 06/07/2011 0856   GLUCOSEU Negative 02/22/2020 1027   HGBUR TRACE (A) 06/07/2011 0856   BILIRUBINUR Negative 02/22/2020 [facility] 06/07/2011 0856   PROTEINUR Negative 02/22/2020 1027   PROTEINUR 30 (A) 06/07/2011 0856   UROBILINOGEN 0.2 06/07/2011 0856   NITRITE Negative 02/22/2020 1027   NITRITE NEGATIVE 06/07/2011 0856   LEUKOCYTESUR Negative 02/22/2020 1027    Lab Results  Component Value Date   LABMICR Comment 02/22/2020   WBCUA None seen 12/21/2019   LABEPIT 0-10 12/21/2019   MUCUS Present 12/21/2019   BACTERIA None seen 12/21/2019    Pertinent Imaging:  No results found for this or any previous visit.  No results found for this or any previous visit.  No results found for this or any previous visit.  No results found for this or any previous visit.  Results for orders placed during the hospital encounter of 12/19/19  US RENAL  Narrative CLINICAL DATA:  Progressive chronic renal failure, obstructive Rob a thick, hypertension  EXAM: RENAL / URINARY TRACT ULTRASOUND COMPLETE  COMPARISON:  None  FINDINGS: Right Kidney:  Renal measurements: 8.4 x 3.3 x 4.4 cm = volume: 69 mL. Cortical thinning. Probably slightly  increased cortical echogenicity. No mass, hydronephrosis, or shadowing calcification.  Left Kidney:  Renal measurements: 11.3 x 6.4 x 5.5 cm = volume: 2 8 mL. Cortical thinning. Increased cortical echogenicity. Cyst at mid upper pole 3.2 x 3.1 x 4.1 cm, simple features. No additional mass, hydronephrosis, or shadowing calcification.  Bladder:  Appears normal for partial degree of bladder distention.  Other:  Enlarged prostate gland 4.9 x 4.7 x 7.0 cm (volume = 84 cm^3) extending into base of urinary bladder  IMPRESSION: Cortical thinning and probable medical renal disease changes of both kidneys.  4.1 cm LEFT renal cyst.  No evidence of additional renal mass or hydronephrosis.  Prostatomegaly.   Electronically Signed By: Lavonia Dana M.D. On: 12/20/2019 10:53  No results found for this or any previous visit.  No results found for this or any previous visit.  No results found for this or any previous visit.   Assessment & Plan:    1. Prostate cancer [facility]) -continue active surveillance -RTC 6 months with PSA  2. Benign prostatic hyperplasia with urinary obstruction -We will switch to dutasteride due to sexual side effects  3. Difficulty urinating -Dutasteride 0.'5mg'$  daily   No follow-ups on file.  Nicolette Bang, MD  [facility] [place]

## 2020-10-01 NOTE — Patient Instructions (Signed)
Prostate Cancer °The prostate is a small gland that helps make semen. It is located below a man's bladder, in front of the rectum. Prostate cancer is when abnormal cells grow in this gland. °What are the causes? °The cause of this condition is not known. °What increases the risk? °Being age 74 or older. °Having a family history of prostate cancer. °Having a family history of cancer of the breasts or ovaries. °Having genes that are passed from parent to child (inherited). °Having Lynch syndrome. °African American men and men of African descent are diagnosed with prostate cancer at higher rates than other men. °What are the signs or symptoms? °Problems peeing (urinating). This may include: °A stream that is weak, or pee that stops and starts. °Trouble starting or stopping your pee. °Trouble emptying all of your pee. °Needing to pee more often, especially at night. °Blood in your pee or semen. °Pain in the: °Lower back. °Lower belly (abdomen). °Hips. °Trouble getting an erection. °Weakness or numbness in the legs or feet. °How is this treated? °Treatment for this condition depends on: °How much the cancer has spread. °Your age. °The kind of treatment you want. °Your health. °Treatments include: °Being watched. This is called observation. You will be tested from time to time, but you will not get treated. Tests are to make sure that the cancer is not growing. °Surgery. This may be done to: °Take out (remove) the prostate. °Freeze and kill cancer cells. °Radiation. This uses a strong beam of energy to kill cancer cells. °Chemotherapy. This uses medicines that stop cancer cells from increasing. This kills cancer cells and healthy cells. °Targeted therapy. This kills cancer cells only. Healthy cells are not affected. °Hormone treatment. This stops the body from making hormones that help the cancer cells grow. °Follow these instructions at home: °Lifestyle °Do not smoke or use any products that contain nicotine or tobacco.  If you need help quitting, ask your doctor. °Eat a healthy diet. °Treatment may affect your ability to have sex. If you have a partner, touch, hold, hug, and caress your partner to have intimate moments. °Get plenty of sleep. °Ask your doctor for help to find a support group for men with prostate cancer. °General instructions °Take over-the-counter and prescription medicines only as told by your doctor. °If you have to go to the hospital, let your cancer doctor (oncologist) know. °Keep all follow-up visits. °Where to find more information °American Cancer Society: www.cancer.org °American Society of Clinical Oncology: www.cancer.net °National Cancer Institute: www.cancer.gov °Contact a doctor if: °You have new or more trouble peeing. °You have new or more blood in your pee. °You have new or more pain in your hips, back, or chest. °Get help right away if: °You have weakness in your legs. °You lose feeling in your legs. °You cannot control your pee or your poop (stool). °You have chills or a fever. °Summary °The prostate is a male gland that helps make semen. °Prostate cancer is when abnormal cells grow in this gland. °Treatment includes doing surgery, using medicines, using strong beams of energy, or watching without treatment. °Ask your doctor for help to find a support group for men with prostate cancer. °Contact a doctor if you have problems peeing or have any new pain that you did not have before. °This information is not intended to replace advice given to you by your health care provider. Make sure you discuss any questions you have with your health care provider. °Document Revised: 04/02/2020 Document Reviewed: 04/02/2020 °Elsevier   Patient Education © 2022 Elsevier Inc. ° °

## 2020-10-02 ENCOUNTER — Other Ambulatory Visit (HOSPITAL_COMMUNITY): Payer: Self-pay

## 2020-10-02 MED ORDER — AZITHROMYCIN 250 MG PO TABS
ORAL_TABLET | ORAL | 0 refills | Status: DC
  Filled 2020-10-02: qty 6, 5d supply, fill #0

## 2020-10-06 ENCOUNTER — Other Ambulatory Visit (HOSPITAL_COMMUNITY): Payer: Self-pay

## 2020-10-20 ENCOUNTER — Other Ambulatory Visit (HOSPITAL_BASED_OUTPATIENT_CLINIC_OR_DEPARTMENT_OTHER): Payer: Self-pay

## 2020-10-20 ENCOUNTER — Ambulatory Visit: Payer: Medicare Other | Attending: Internal Medicine

## 2020-10-20 DIAGNOSIS — Z23 Encounter for immunization: Secondary | ICD-10-CM

## 2020-10-20 MED ORDER — PFIZER COVID-19 VAC BIVALENT 30 MCG/0.3ML IM SUSP
INTRAMUSCULAR | 0 refills | Status: DC
  Filled 2020-10-20: qty 0.3, 1d supply, fill #0

## 2020-10-20 NOTE — Progress Notes (Signed)
   Covid-19 Vaccination Clinic  Name:  Joel Duarte    MRN: [id number] DOB: 05/25/1945  10/20/2020  Joel Duarte was observed post Covid-19 immunization for 15 minutes without incident. He was provided with Vaccine Information Sheet and instruction to access the V-Safe system.   Joel Duarte was instructed to call 911 with any severe reactions post vaccine: Difficulty breathing  Swelling of face and throat  A fast heartbeat  A bad rash all over body  Dizziness and weakness

## 2020-10-21 ENCOUNTER — Other Ambulatory Visit (HOSPITAL_COMMUNITY): Payer: Self-pay

## 2020-11-03 NOTE — Progress Notes (Signed)
Assessment/Plan:   1. 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  -Patient will continue his Keppra, 750 mg twice per day.  2.  Hypertension with renal insufficiency  -Discussed importance of controlling blood pressure.  -Patient's creatinine clearance currently 34.  Okay to continue this dose of Keppra.  May need to renally dose in the future.  3.  Hand paresthesias  -likely entrapment neuropathy, perhaps CTS  -discussed cock up wrist splints  -He was think about EMG, but does not want to schedule that right now.  Well and he changes his mind before next visit.  4.  Follow-up 1 year   Subjective:   Joel Duarte was seen today in follow up for seizure/frontal lobe epilepsy.  My previous records as well as any outside records available were reviewed prior to todays visit.  Pt is currently on Keppra, 750 mg twice per day.  He has been compliant with medication.  He has been seizure-free.  He has been following frequently with urology because of history of prostate cancer and elevated PSA.  PSA has been going down now.  He is having issues with paresthesias of the fingertips.  Started in R hand years ago and now in the L as well.  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     PREVIOUS MEDICATIONS: Dilantin (headache)  CURRENT MEDICATIONS:  Outpatient Encounter Medications as of 11/04/2020  Medication Sig   allopurinol (ZYLOPRIM) 100 MG tablet TAKE 1 TABLET BY MOUTH ONCE DAILY   allopurinol (ZYLOPRIM) 100 MG tablet Take 1 tablet (100 mg total) by mouth daily.   amLODipine (NORVASC) 10 MG tablet Take 10 mg by mouth daily.   amLODipine (NORVASC) 10 MG tablet TAKE 1 TABLET BY MOUTH ONCE DAILY.   amLODipine (NORVASC) 5 MG tablet Take 1 tablet (5 mg total) by mouth daily.   amoxicillin-clavulanate (AUGMENTIN) 500-125 MG tablet TAKE 1 TABLET BY MOUTH 2 TIMES A DAY   azelastine (ASTELIN) 0.1 % nasal spray USE ONE PUFF IN EACH NOSTRIL TWICE A DAY AS NEEDED    azithromycin (ZITHROMAX) 250 MG tablet TAKE 2 TABLETS BY MOUTH ON DAY 1 THEN TAKE 1 TABLET DAILY FOR THE NEXT 4 DAYS (Patient not taking: Reported on 11/04/2020)   azithromycin (ZITHROMAX) 250 MG tablet TAKE 2 TABLETS BY MOUTH ON DAY 1 THEN TAKE 1 TABLET DAILY FOR THE NEXT 4 DAYS   azithromycin (ZITHROMAX) 250 MG tablet Take 2 tablets by mouth on day 1, then one tablet daily thereafter   azithromycin (ZITHROMAX) 250 MG tablet Take 2 tablets by mouth on day 1, then take one daily   carvedilol (COREG) 25 MG tablet Take 1 tablet (25 mg total) by mouth 2 (two) times daily.   COVID-19 mRNA bivalent vaccine, Pfizer, (PFIZER COVID-19 VAC BIVALENT) injection Inject into the muscle.   diclofenac Sodium (VOLTAREN) 1 % GEL    doxazosin (CARDURA) 8 MG tablet Take 8 mg by mouth at bedtime.   doxazosin (CARDURA) 8 MG tablet TAKE 1 TABLET BY MOUTH IN MORNING AND 1/2 TABLET IN THE EVENING   doxazosin (CARDURA) 8 MG tablet Take 1 tablet by mouth in the morning & 1/2 tab at bedtime   dutasteride (AVODART) 0.5 MG capsule Take 1 capsule (0.5 mg total) by mouth daily. (Patient not taking: Reported on 11/04/2020)   fluticasone (FLONASE) 50 MCG/ACT nasal spray Place into both nostrils daily.   fluticasone (FLONASE) 50 MCG/ACT nasal spray USE 1 SPRAY INTO EACH NOSTRIL TWO TIMES DAILY AS NEEDED  furosemide (LASIX) 20 MG tablet    furosemide (LASIX) 20 MG tablet Take 1 tablet (20 mg total) by mouth daily.   hydrALAZINE (APRESOLINE) 50 MG tablet Take 1 tablet (50 mg total) by mouth 2 (two) times daily.   levETIRAcetam (KEPPRA) 750 MG tablet TAKE 1 TABLET (750 MG TOTAL) BY MOUTH 2 (TWO) TIMES DAILY.   levofloxacin (LEVAQUIN) 750 MG tablet TAKE 1 TABLET (750 MG TOTAL) BY MOUTH ONCE FOR 1 DOSE. TAKE THE MORNING OF YOUR PROCEDURE   loratadine (CLARITIN) 10 MG tablet Take 1 tablet by mouth daily   losartan (COZAAR) 100 MG tablet TAKE 1 TABLET BY MOUTH ONCE DAILY   losartan (COZAAR) 100 MG tablet Take 1 tablet (100 mg total)  by mouth daily.   meclizine (ANTIVERT) 25 MG tablet TAKE 1 TABLET 3 TIMES A DAY AS NEEDED FOR DIZZINESS. (Patient not taking: Reported on 11/04/2020)   metoprolol succinate (TOPROL-XL) 100 MG 24 hr tablet Take 100 mg by mouth daily. Take with or immediately following a meal.   naproxen (NAPROSYN) 500 MG tablet Take 500 mg by mouth daily as needed.   sildenafil (VIAGRA) 100 MG tablet TAKE 1 TABLET BY MOUTH 1 HOUR PRIOR TO SEXUAL ACTIVITY   sildenafil (VIAGRA) 100 MG tablet Take 1 tablet by mouth 2 hours before sexual activity, once daily at the most   sulfamethoxazole-trimethoprim (BACTRIM DS) 800-160 MG tablet TAKE 1 TABLET BY MOUTH EVERY 12 HOURS. (Patient not taking: Reported on 03/17/2020)   [DISCONTINUED] benazepril (LOTENSIN) 40 MG tablet TAKE 1 TABLET BY MOUTH DAILY   [DISCONTINUED] NIFEdipine (PROCARDIA) 10 MG capsule Take 1 capsule (10 mg total) by mouth 2 (two) times daily.   [DISCONTINUED] Vitamin D, Ergocalciferol, (DRISDOL) 50000 units CAPS capsule Take 50,000 Units by mouth once a week. (Patient not taking: Reported on 11/04/2020)   [DISCONTINUED] zolpidem (AMBIEN) 5 MG tablet TAKE 1 TABLET BY MOUTH AT BEDTIME AS NEEDED FOR SLEEP   No facility-administered encounter medications on file as of 11/04/2020.     Objective:   PHYSICAL EXAMINATION:    VITALS:   Vitals:   11/04/20 1426  BP: 134/82  Pulse: 64  SpO2: 96%  Weight: 223 lb 3.2 oz (101.2 kg)  Height: 6\' 2"  (1.88 m)     GEN:  The patient appears stated age and is in NAD. HEENT:  Normocephalic, atraumatic.  The mucous membranes are moist. The superficial temporal arteries are without ropiness or tenderness. CV:  RRR Lungs:  CTAB Neck/HEME:  There are no carotid bruits bilaterally.  Neurological examination:  Orientation: The patient is alert and oriented x3. Cranial nerves: There is good facial symmetry.extraocular muscles are intact, without nystagmus.  The speech is fluent and clear. Soft palate rises  symmetrically and there is no tongue deviation. Hearing is intact to conversational tone. Sensation: Sensation is intact to light touch throughout Motor: Strength is 5/5 in the bilateral upper and lower extremities.  There is no pronator drift.  Movement examination: Tone: There is normal tone in the UE/LE Abnormal movements:  no tremor.  No myoclonus.  No asterixis.   Coordination:  There is no decremation with RAM's. Gait and Station: The patient has no difficulty arising out of a deep-seated chair without the use of the hands. The patient's stride length is good.  He is able to ambulate in a tandem fashion.  He is able to stand in Romberg position.  I have reviewed and interpreted the following labs independently Patient had lab work on June 19, 2020.  Sodium was 143, potassium 4.8, chloride 109, CO2 25, glucose 94, creatinine was elevated at either 2.03 or 2.83 (fax was a terrible copy).  Creatinine clearance was 34.  AST 18, ALT 18, white blood cells 5.3, hemoglobin 11.6, hematocrit 38.9 and platelets 172.  Hemoglobin A1c 6.0    Cc:  Rogers Blocker, MD

## 2020-11-04 ENCOUNTER — Other Ambulatory Visit (HOSPITAL_COMMUNITY): Payer: Self-pay

## 2020-11-04 ENCOUNTER — Other Ambulatory Visit: Payer: Self-pay

## 2020-11-04 ENCOUNTER — Ambulatory Visit (INDEPENDENT_AMBULATORY_CARE_PROVIDER_SITE_OTHER): Payer: Medicare Other | Admitting: Neurology

## 2020-11-04 ENCOUNTER — Encounter: Payer: Self-pay | Admitting: Neurology

## 2020-11-04 VITALS — BP 134/82 | HR 64 | Ht 74.0 in | Wt 223.2 lb

## 2020-11-04 DIAGNOSIS — R202 Paresthesia of skin: Secondary | ICD-10-CM | POA: Diagnosis not present

## 2020-11-04 DIAGNOSIS — N289 Disorder of kidney and ureter, unspecified: Secondary | ICD-10-CM | POA: Diagnosis not present

## 2020-11-04 DIAGNOSIS — G40109 Localization-related (focal) (partial) symptomatic epilepsy and epileptic syndromes with simple partial seizures, not intractable, without status epilepticus: Secondary | ICD-10-CM

## 2020-11-04 MED ORDER — LEVETIRACETAM 750 MG PO TABS
750.0000 mg | ORAL_TABLET | Freq: Two times a day (BID) | ORAL | 3 refills | Status: DC
  Filled 2020-11-04: qty 180, 90d supply, fill #0
  Filled 2021-02-12: qty 180, 90d supply, fill #1
  Filled 2021-05-11: qty 180, 90d supply, fill #2
  Filled 2021-08-06: qty 180, 90d supply, fill #3

## 2020-11-06 ENCOUNTER — Other Ambulatory Visit (HOSPITAL_COMMUNITY): Payer: Self-pay

## 2020-11-07 ENCOUNTER — Other Ambulatory Visit (HOSPITAL_COMMUNITY): Payer: Self-pay

## 2020-11-12 ENCOUNTER — Other Ambulatory Visit (HOSPITAL_COMMUNITY): Payer: Self-pay

## 2020-11-12 MED ORDER — FUROSEMIDE 20 MG PO TABS
30.0000 mg | ORAL_TABLET | Freq: Every day | ORAL | 2 refills | Status: DC
  Filled 2020-11-12: qty 135, 90d supply, fill #0
  Filled 2021-02-12: qty 135, 90d supply, fill #1

## 2020-11-25 ENCOUNTER — Other Ambulatory Visit (HOSPITAL_COMMUNITY): Payer: Self-pay | Admitting: Internal Medicine

## 2020-11-26 ENCOUNTER — Other Ambulatory Visit (HOSPITAL_COMMUNITY): Payer: Self-pay

## 2020-11-27 ENCOUNTER — Other Ambulatory Visit (HOSPITAL_COMMUNITY): Payer: Self-pay

## 2020-11-27 MED ORDER — AZELASTINE HCL 0.1 % NA SOLN
2.0000 | Freq: Two times a day (BID) | NASAL | 11 refills | Status: DC
  Filled 2020-11-27: qty 30, 25d supply, fill #0
  Filled 2021-03-02: qty 30, 25d supply, fill #1

## 2020-12-01 ENCOUNTER — Other Ambulatory Visit (HOSPITAL_COMMUNITY): Payer: Self-pay

## 2020-12-01 MED ORDER — AZITHROMYCIN 250 MG PO TABS
ORAL_TABLET | ORAL | 0 refills | Status: DC
  Filled 2020-12-01: qty 6, 5d supply, fill #0

## 2020-12-18 ENCOUNTER — Other Ambulatory Visit (HOSPITAL_COMMUNITY): Payer: Self-pay

## 2020-12-18 MED ORDER — DOXAZOSIN MESYLATE 8 MG PO TABS: ORAL_TABLET | ORAL | 4 refills | Status: DC

## 2020-12-18 MED ORDER — DOXAZOSIN MESYLATE 8 MG PO TABS
8.0000 mg | ORAL_TABLET | ORAL | 4 refills | Status: DC
  Filled 2020-12-18: qty 135, 90d supply, fill #0

## 2020-12-25 ENCOUNTER — Other Ambulatory Visit: Payer: Self-pay | Admitting: Urology

## 2020-12-25 ENCOUNTER — Other Ambulatory Visit (HOSPITAL_COMMUNITY): Payer: Self-pay

## 2020-12-26 ENCOUNTER — Other Ambulatory Visit (HOSPITAL_COMMUNITY): Payer: Self-pay

## 2020-12-26 MED ORDER — DUTASTERIDE 0.5 MG PO CAPS
0.5000 mg | ORAL_CAPSULE | Freq: Every day | ORAL | 3 refills | Status: DC
  Filled 2020-12-26: qty 90, 90d supply, fill #0
  Filled 2021-03-23: qty 90, 90d supply, fill #1

## 2020-12-31 ENCOUNTER — Other Ambulatory Visit (HOSPITAL_COMMUNITY): Payer: Self-pay

## 2020-12-31 MED ORDER — AZITHROMYCIN 250 MG PO TABS
ORAL_TABLET | ORAL | 0 refills | Status: DC
  Filled 2020-12-31: qty 6, 5d supply, fill #0

## 2021-01-01 ENCOUNTER — Other Ambulatory Visit (HOSPITAL_COMMUNITY): Payer: Self-pay

## 2021-01-02 ENCOUNTER — Other Ambulatory Visit (HOSPITAL_COMMUNITY): Payer: Self-pay

## 2021-01-05 ENCOUNTER — Other Ambulatory Visit (HOSPITAL_COMMUNITY): Payer: Self-pay

## 2021-01-21 ENCOUNTER — Other Ambulatory Visit (HOSPITAL_COMMUNITY): Payer: Self-pay

## 2021-01-21 MED ORDER — AMLODIPINE BESYLATE 5 MG PO TABS
5.0000 mg | ORAL_TABLET | Freq: Every day | ORAL | 3 refills | Status: DC
  Filled 2021-01-21: qty 90, 90d supply, fill #0

## 2021-01-22 ENCOUNTER — Other Ambulatory Visit (HOSPITAL_COMMUNITY): Payer: Self-pay

## 2021-02-12 ENCOUNTER — Other Ambulatory Visit (HOSPITAL_COMMUNITY): Payer: Self-pay

## 2021-02-19 ENCOUNTER — Other Ambulatory Visit (HOSPITAL_COMMUNITY): Payer: Self-pay

## 2021-02-19 MED ORDER — CLINDAMYCIN HCL 300 MG PO CAPS
300.0000 mg | ORAL_CAPSULE | Freq: Two times a day (BID) | ORAL | 1 refills | Status: DC
  Filled 2021-02-19: qty 14, 7d supply, fill #0

## 2021-03-02 ENCOUNTER — Other Ambulatory Visit (HOSPITAL_COMMUNITY): Payer: Self-pay

## 2021-03-03 ENCOUNTER — Other Ambulatory Visit: Payer: Self-pay

## 2021-03-14 ENCOUNTER — Other Ambulatory Visit (HOSPITAL_COMMUNITY): Payer: Self-pay

## 2021-03-15 MED ORDER — DOXAZOSIN MESYLATE 8 MG PO TABS
ORAL_TABLET | ORAL | 2 refills | Status: DC
  Filled 2021-03-15: qty 135, 90d supply, fill #0

## 2021-03-16 ENCOUNTER — Other Ambulatory Visit (HOSPITAL_COMMUNITY): Payer: Self-pay

## 2021-03-23 ENCOUNTER — Other Ambulatory Visit (HOSPITAL_COMMUNITY): Payer: Self-pay

## 2021-03-23 MED ORDER — ALLOPURINOL 100 MG PO TABS
100.0000 mg | ORAL_TABLET | Freq: Every day | ORAL | 2 refills | Status: DC
  Filled 2021-03-23: qty 90, 90d supply, fill #0

## 2021-03-24 ENCOUNTER — Other Ambulatory Visit (HOSPITAL_COMMUNITY): Payer: Self-pay

## 2021-03-26 ENCOUNTER — Other Ambulatory Visit (HOSPITAL_COMMUNITY): Payer: Self-pay

## 2021-03-26 MED ORDER — AZITHROMYCIN 250 MG PO TABS
ORAL_TABLET | ORAL | 0 refills | Status: DC
  Filled 2021-03-26: qty 6, 5d supply, fill #0

## 2021-04-01 ENCOUNTER — Other Ambulatory Visit: Payer: Self-pay

## 2021-04-01 ENCOUNTER — Other Ambulatory Visit (HOSPITAL_COMMUNITY): Payer: Self-pay

## 2021-04-01 ENCOUNTER — Ambulatory Visit (INDEPENDENT_AMBULATORY_CARE_PROVIDER_SITE_OTHER): Payer: Medicare Other | Admitting: Urology

## 2021-04-01 VITALS — BP 124/68 | HR 71

## 2021-04-01 DIAGNOSIS — R350 Frequency of micturition: Secondary | ICD-10-CM | POA: Diagnosis not present

## 2021-04-01 DIAGNOSIS — C61 Malignant neoplasm of prostate: Secondary | ICD-10-CM

## 2021-04-01 DIAGNOSIS — N138 Other obstructive and reflux uropathy: Secondary | ICD-10-CM

## 2021-04-01 DIAGNOSIS — N401 Enlarged prostate with lower urinary tract symptoms: Secondary | ICD-10-CM

## 2021-04-01 LAB — URINALYSIS, ROUTINE W REFLEX MICROSCOPIC
Bilirubin, UA: NEGATIVE
Glucose, UA: NEGATIVE
Ketones, UA: NEGATIVE
Leukocytes,UA: NEGATIVE
Nitrite, UA: NEGATIVE
RBC, UA: NEGATIVE
Specific Gravity, UA: 1.02 (ref 1.005–1.030)
Urobilinogen, Ur: 0.2 mg/dL (ref 0.2–1.0)
pH, UA: 5.5 (ref 5.0–7.5)

## 2021-04-01 LAB — MICROSCOPIC EXAMINATION
Bacteria, UA: NONE SEEN
Epithelial Cells (non renal): NONE SEEN /hpf (ref 0–10)
RBC, Urine: NONE SEEN /hpf (ref 0–2)
Renal Epithel, UA: NONE SEEN /hpf
WBC, UA: NONE SEEN /hpf (ref 0–5)

## 2021-04-01 MED ORDER — DUTASTERIDE 0.5 MG PO CAPS
0.5000 mg | ORAL_CAPSULE | Freq: Every day | ORAL | 3 refills | Status: DC
  Filled 2021-04-01 – 2021-06-14 (×2): qty 90, 90d supply, fill #0
  Filled 2021-09-21: qty 90, 90d supply, fill #1

## 2021-04-01 NOTE — Patient Instructions (Signed)

## 2021-04-01 NOTE — Progress Notes (Signed)
? ?04/01/2021 ?2:46 PM  ? ?Joel Duarte ?1945-04-06 ?[id number] ? ?Referring provider: Rogers Blocker, MD ?[address] ?[address] ?[place],  [place] [address] ? ?Followup prostate cancer and BPH ? ? ?HPI: ?[name] is a 76yo here for followup for prostate cancer and BPH. PSA decreased to 15.05 on dutasteride. IPSS 7 QOL 2. Urine stream strong., He has urinary frequency every 2-3 hours. The frequency is worse after he takes his lasix. No other complaints today ? ? ?PMH: ?Past Medical History:  ?Diagnosis Date  ? Allergy   ? Enlarged prostate   ? Gout   ? Hypertension   ? Osteoarthrosis, unspecified whether generalized or localized, unspecified site   ? Seizures ([place])   ? history of 1 a year ago  ? ? ?Surgical History: ?Past Surgical History:  ?Procedure Laterality Date  ? no surgical h/o  June 2014  ? ? ?Home Medications:  ?Allergies as of 04/01/2021   ?No Known Allergies ?  ? ?  ?Medication List  ?  ? ?  ? Accurate as of April 01, 2021  2:46 PM. If you have any questions, ask your nurse or doctor.  ?  ?  ? ?  ? ?allopurinol 100 MG tablet ?Commonly known as: ZYLOPRIM ?Take 1 tablet (100 mg total) by mouth daily. ?  ?amLODipine 10 MG tablet ?Commonly known as: NORVASC ?TAKE 1 TABLET BY MOUTH ONCE DAILY. ?  ?amLODipine 5 MG tablet ?Commonly known as: NORVASC ?Take 1 tablet (5 mg total) by mouth daily. ?  ?azelastine 0.1 % nasal spray ?Commonly known as: ASTELIN ?USE ONE PUFF IN EACH NOSTRIL TWICE A DAY AS NEEDED ?  ?azelastine 0.1 % nasal spray ?Commonly known as: ASTELIN ?Place 2 sprays into both nostrils 2 (two) times daily. ?  ?azithromycin 250 MG tablet ?Commonly known as: ZITHROMAX ?Take 2 tablets by mouth on day 1, then one daily on days 2-5 ?  ?carvedilol 25 MG tablet ?Commonly known as: COREG ?Take 1 tablet (25 mg total) by mouth 2 (two) times daily. ?  ?clindamycin 300 MG capsule ?Commonly known as: CLEOCIN ?Take 1 capsule (300 mg total) by mouth 2 (two) times daily. ?  ?diclofenac Sodium 1 % Gel ?Commonly  known as: VOLTAREN ?  ?doxazosin 8 MG tablet ?Commonly known as: CARDURA ?Take 8 mg by mouth at bedtime. ?  ?doxazosin 8 MG tablet ?Commonly known as: CARDURA ?TAKE 1 TABLET BY MOUTH IN MORNING AND 1/2 TABLET IN THE EVENING ?  ?doxazosin 8 MG tablet ?Commonly known as: CARDURA ?Take 1 tablet (8 mg total) by mouth in the morning AND 0.5 tablets (4 mg total) at bedtime. ?  ?doxazosin 8 MG tablet ?Commonly known as: CARDURA ?Take 1 tablet (8 mg total) by mouth in the morning AND 0.5 tablets (4 mg total) at bedtime. ?  ?dutasteride 0.5 MG capsule ?Commonly known as: AVODART ?Take 1 capsule (0.5 mg total) by mouth daily. ?  ?fluticasone 50 MCG/ACT nasal spray ?Commonly known as: FLONASE ?USE 1 SPRAY INTO EACH NOSTRIL TWO TIMES DAILY AS NEEDED ?  ?fluticasone 50 MCG/ACT nasal spray ?Commonly known as: FLONASE ?Place into both nostrils daily. ?  ?furosemide 20 MG tablet ?Commonly known as: LASIX ?  ?furosemide 20 MG tablet ?Commonly known as: LASIX ?Take 1 tablet (20 mg total) by mouth daily. ?  ?furosemide 20 MG tablet ?Commonly known as: LASIX ?Take 1.5 tablets (30 mg total) by mouth daily. ?  ?hydrALAZINE 50 MG tablet ?Commonly known as: APRESOLINE ?Take 1 tablet (  50 mg total) by mouth 2 (two) times daily. ?  ?levETIRAcetam 750 MG tablet ?Commonly known as: KEPPRA ?Take 1 tablet (750 mg total) by mouth 2 (two) times daily. ?  ?loratadine 10 MG tablet ?Commonly known as: CLARITIN ?Take 1 tablet by mouth daily ?  ?losartan 100 MG tablet ?Commonly known as: COZAAR ?TAKE 1 TABLET BY MOUTH ONCE DAILY ?  ?losartan 100 MG tablet ?Commonly known as: COZAAR ?Take 1 tablet (100 mg total) by mouth daily. ?  ?metoprolol succinate 100 MG 24 hr tablet ?Commonly known as: TOPROL-XL ?Take 100 mg by mouth daily. Take with or immediately following a meal. ?  ?naproxen 500 MG tablet ?Commonly known as: NAPROSYN ?Take 500 mg by mouth daily as needed. ?  ?Pfizer COVID-19 Vac Bivalent injection ?Generic drug: COVID-19 mRNA bivalent vaccine  [employer]) ?Inject into the muscle. ?  ?sildenafil 100 MG tablet ?Commonly known as: VIAGRA ?TAKE 1 TABLET BY MOUTH 1 HOUR PRIOR TO SEXUAL ACTIVITY ?  ?sildenafil 100 MG tablet ?Commonly known as: VIAGRA ?Take 1 tablet by mouth 2 hours before sexual activity, once daily at the most ?  ? ?  ? ? ?Allergies: No Known Allergies ? ?Family History: ?Family History  ?Problem Relation Age of Onset  ? Hypertension Mother   ? Healthy Sister   ? Healthy Child   ? ? ?Social History:  reports that he has never smoked. He has never used smokeless tobacco. He reports current alcohol use. He reports that he does not use drugs. ? ?ROS: ?All other review of systems were reviewed and are negative except what is noted above in HPI ? ?Physical Exam: ?BP 124/68   Pulse 71   ?Constitutional:  Alert and oriented, No acute distress. ?HEENT: [place] AT, moist mucus membranes.  Trachea midline, no masses. ?Cardiovascular: No clubbing, cyanosis, or edema. ?Respiratory: Normal respiratory effort, no increased work of breathing. ?GI: Abdomen is soft, nontender, nondistended, no abdominal masses ?GU: No CVA tenderness.  ?Lymph: No cervical or inguinal lymphadenopathy. ?Skin: No rashes, bruises or suspicious lesions. ?Neurologic: Grossly intact, no focal deficits, moving all 4 extremities. ?Psychiatric: Normal mood and affect. ? ?Laboratory Data: ?Lab Results  ?Component Value Date  ? WBC 10.6 (H) 08/06/2011  ? HGB 13.9 03/27/2013  ? HCT 41.0 03/27/2013  ? MCV 79.0 08/06/2011  ? PLT 158 08/06/2011  ? ? ?Lab Results  ?Component Value Date  ? CREATININE 1.20 03/27/2013  ? ? ?No results found for: PSA ? ?No results found for: TESTOSTERONE ? ?Lab Results  ?Component Value Date  ? HGBA1C 6.0 11/30/2011  ? ? ?Urinalysis ?   ?Component Value Date/Time  ? COLORURINE YELLOW 06/07/2011 0856  ? APPEARANCEUR Clear 02/22/2020 1027  ? LABSPEC 1.013 06/07/2011 0856  ? PHURINE 5.0 06/07/2011 0856  ? GLUCOSEU Negative 02/22/2020 1027  ? HGBUR TRACE (A) 06/07/2011 0856   ? BILIRUBINUR Negative 02/22/2020 1027  ? [place] NEGATIVE 06/07/2011 0856  ? PROTEINUR Negative 02/22/2020 1027  ? PROTEINUR 30 (A) 06/07/2011 0856  ? UROBILINOGEN 0.2 06/07/2011 0856  ? NITRITE Negative 02/22/2020 1027  ? NITRITE NEGATIVE 06/07/2011 0856  ? LEUKOCYTESUR Negative 02/22/2020 1027  ? ? ?Lab Results  ?Component Value Date  ? LABMICR Comment 02/22/2020  ? [place] None seen 12/21/2019  ? LABEPIT 0-10 12/21/2019  ? MUCUS Present 12/21/2019  ? BACTERIA None seen 12/21/2019  ? ? ?Pertinent Imaging: ? ?No results found for this or any previous visit. ? ?No results found for this or any previous visit. ? ?  No results found for this or any previous visit. ? ?No results found for this or any previous visit. ? ?Results for orders placed during the hospital encounter of 12/19/19 ? ?US RENAL ? ?Narrative ?CLINICAL DATA:  Progressive chronic renal failure, obstructive Rob a ?thick, hypertension ? ?EXAM: ?RENAL / URINARY TRACT ULTRASOUND COMPLETE ? ?COMPARISON:  None ? ?FINDINGS: ?Right Kidney: ? ?Renal measurements: 8.4 x 3.3 x 4.4 cm = volume: 69 mL. Cortical ?thinning. Probably slightly increased cortical echogenicity. No ?mass, hydronephrosis, or shadowing calcification. ? ?Left Kidney: ? ?Renal measurements: 11.3 x 6.4 x 5.5 cm = volume: 2 8 mL. Cortical ?thinning. Increased cortical echogenicity. Cyst at mid upper pole ?3.2 x 3.1 x 4.1 cm, simple features. No additional mass, ?hydronephrosis, or shadowing calcification. ? ?Bladder: ? ?Appears normal for partial degree of bladder distention. ? ?Other: ? ?Enlarged prostate gland 4.9 x 4.7 x 7.0 cm (volume = 84 cm^3) ?extending into base of urinary bladder ? ?IMPRESSION: ?Cortical thinning and probable medical renal disease changes of both ?kidneys. ? ?4.1 cm LEFT renal cyst. ? ?No evidence of additional renal mass or hydronephrosis. ? ?Prostatomegaly. ? ? ?Electronically Signed ?By: Lavonia Dana M.D. ?On: 12/20/2019 10:53 ? ?No results found for this or any  previous visit. ? ?No results found for this or any previous visit. ? ?No results found for this or any previous visit. ? ? ?Assessment & Plan:   ? ?1. Prostate cancer ([place]) ?-RTC 6 motnhs with PSA ?- Urinalys

## 2021-04-03 ENCOUNTER — Other Ambulatory Visit: Payer: Self-pay

## 2021-04-03 ENCOUNTER — Ambulatory Visit: Payer: Medicare Other | Admitting: Cardiology

## 2021-04-03 ENCOUNTER — Encounter: Payer: Self-pay | Admitting: Cardiology

## 2021-04-03 VITALS — BP 164/92 | HR 71 | Temp 97.3°F | Resp 16 | Ht 74.0 in | Wt 233.2 lb

## 2021-04-03 DIAGNOSIS — Z13220 Encounter for screening for lipoid disorders: Secondary | ICD-10-CM

## 2021-04-03 DIAGNOSIS — R0609 Other forms of dyspnea: Secondary | ICD-10-CM

## 2021-04-03 DIAGNOSIS — G4731 Primary central sleep apnea: Secondary | ICD-10-CM

## 2021-04-03 DIAGNOSIS — I10 Essential (primary) hypertension: Secondary | ICD-10-CM

## 2021-04-03 DIAGNOSIS — R7303 Prediabetes: Secondary | ICD-10-CM

## 2021-04-03 DIAGNOSIS — N1832 Chronic kidney disease, stage 3b: Secondary | ICD-10-CM

## 2021-04-03 NOTE — Progress Notes (Signed)
? ?Date:  04/03/2021  ? ?ID:  Joel Duarte, DOB 06-16-1945, MRN [id number] ? ?PCP:  Joel Blocker, MD  ?Cardiologist:  Joel Kras, DO, [facility] (established care April 03, 2021) ? ?REASON FOR CONSULT: Elevated BNP ? ?REQUESTING PHYSICIAN:  ?Joel Blocker, MD ?[address] ?[address] ?[place],  [place] [phone] ? ?Chief Complaint  ?Patient presents with  ? Elevated brain natriuretic peptide   ? Hypertension  ? New Patient (Initial Visit)  ? Shortness of Breath  ? ? ?HPI  ?Joel Duarte is a 76 y.o. African-American male who presents to the office with a chief complaint of " shortness of breath." Patient's past medical history and cardiovascular risk factors include: Hypertension, erectile dysfunction, gout, history of seizures, central sleep apnea, prediabetes, chronic kidney disease stage IIIb, advanced age. ? ?He is referred to the office at the request of Joel Blocker, MD for evaluation of elevated BNP. ? ?Few months ago patient remembers being short of breath more than baseline and also was retaining fluids.  He was started on diuretic therapy by PCP and had lost 10 pounds in the shortness of breath that improved significantly.  He still continues to have shortness of breath with exertion and feels more tired/fatigue/noticeable reduction in endurance with physical activity.  He had recent labs again with PCP and his BNP levels continue to be elevated and therefore he is referred to cardiology for further evaluation and management. ? ?No prior cardiovascular work-up. ? ?Home blood pressures are around 140 mmHg.  Today the numbers are elevated as he has not taken his morning medications. ? ?Denies active chest pain. ? ?FUNCTIONAL STATUS: ?No structured exercise program or daily routine.  ? ?ALLERGIES: ?No Known Allergies ? ?MEDICATION LIST PRIOR TO VISIT: ?Current Meds  ?Medication Sig  ? allopurinol (ZYLOPRIM) 100 MG tablet Take 100 mg by mouth daily.  ? amLODipine (NORVASC) 5 MG tablet Take 1 tablet (5 mg total) by  mouth daily.  ? azelastine (ASTELIN) 0.1 % nasal spray USE ONE PUFF IN EACH NOSTRIL TWICE A DAY AS NEEDED  ? carvedilol (COREG) 25 MG tablet Take 1 tablet (25 mg total) by mouth 2 (two) times daily.  ? diclofenac Sodium (VOLTAREN) 1 % GEL   ? doxazosin (CARDURA) 8 MG tablet TAKE 1 TABLET BY MOUTH IN MORNING AND 1/2 TABLET IN THE EVENING  ? dutasteride (AVODART) 0.5 MG capsule Take 1 capsule (0.5 mg total) by mouth daily.  ? fluticasone (FLONASE) 50 MCG/ACT nasal spray USE 1 SPRAY INTO EACH NOSTRIL TWO TIMES DAILY AS NEEDED  ? furosemide (LASIX) 20 MG tablet   ? levETIRAcetam (KEPPRA) 750 MG tablet Take 1 tablet (750 mg total) by mouth 2 (two) times daily.  ? loratadine (CLARITIN) 10 MG tablet Take 1 tablet by mouth daily  ? losartan (COZAAR) 100 MG tablet TAKE 1 TABLET BY MOUTH ONCE DAILY  ? sildenafil (VIAGRA) 100 MG tablet Take 1 tablet by mouth 2 hours before sexual activity, once daily at the most  ? [DISCONTINUED] allopurinol (ZYLOPRIM) 100 MG tablet Take 1 tablet (100 mg total) by mouth daily.  ?  ? ?PAST MEDICAL HISTORY: ?Past Medical History:  ?Diagnosis Date  ? Allergy   ? Enlarged prostate   ? Gout   ? Hypertension   ? Osteoarthrosis, unspecified whether generalized or localized, unspecified site   ? Seizures ([place])   ? history of 1 a year ago  ? ? ?PAST SURGICAL HISTORY: ?Past Surgical History:  ?Procedure Laterality Date  ?  no surgical h/o  June 2014  ? ? ?FAMILY HISTORY: ?The patient family history includes Healthy in his child and sister; Heart Problems in his mother; Hypertension in his mother. ? ?SOCIAL HISTORY:  ?The patient  reports that he has never smoked. He has never used smokeless tobacco. He reports that he does not currently use alcohol. He reports that he does not use drugs. ? ?REVIEW OF SYSTEMS: ?Review of Systems  ?Constitutional: Positive for malaise/fatigue.  ?Cardiovascular:  Positive for dyspnea on exertion and leg swelling (improved). Negative for chest pain, orthopnea, palpitations,  paroxysmal nocturnal dyspnea and syncope.  ?Respiratory:  Positive for shortness of breath.   ? ?PHYSICAL EXAM: ?Vitals with BMI 04/03/2021 04/03/2021 04/01/2021  ?Height - 6' 2"  -  ?Weight - 233 lbs 3 oz -  ?BMI - 29.93 -  ?Systolic 194 174 081  ?Diastolic 92 87 68  ?Pulse 71 78 71  ? ? ?CONSTITUTIONAL: Well-developed and well-nourished. No acute distress.  ?SKIN: Skin is warm and dry. No rash noted. No cyanosis. No pallor. No jaundice ?HEAD: Normocephalic and atraumatic.  ?EYES: No scleral icterus ?MOUTH/THROAT: Moist oral membranes.  ?NECK: No JVD present. No thyromegaly noted. No carotid bruits  ?LYMPHATIC: No visible cervical adenopathy.  ?CHEST Normal respiratory effort. No intercostal retractions  ?LUNGS: Clear to auscultation bilaterally.  No stridor. No wheezes. No rales.  ?CARDIOVASCULAR: Regular rate and rhythm, positive K4-Y1, soft holosystolic murmur heard at the apex, no rubs or gallops appreciated. ?ABDOMINAL:  soft, nontender, nondistended, positive bowel sounds all 4 quadrants. No apparent ascites.  ?EXTREMITIES: 1+ bilateral peripheral edema, warm to touch, compression stockings present ?HEMATOLOGIC: No significant bruising ?NEUROLOGIC: Oriented to person, place, and time. Nonfocal. Normal muscle tone.  ?PSYCHIATRIC: Normal mood and affect. Normal behavior. Cooperative ? ?CARDIAC DATABASE: ?EKG: ?04/03/2021: NSR, 66 bpm, first-degree AV block, left axis, possible old anteroseptal infarct, nonspecific T wave changes. ? ?Echocardiogram: ?No results found for this or any previous visit from the past 1095 days. ?  ? ?Stress Testing: ?No results found for this or any previous visit from the past 1095 days. ? ? ?Heart Catheterization: ?None ? ?LABORATORY DATA: ?External Labs: ?Collected: December 01, 2020 ?Sodium 144, potassium 4.9, chloride 108, bicarb 26. ?AST 17, ALT 12, alkaline phosphatase 82. ?Hemoglobin 12 g/dL, hematocrit 38.3%. ?BNP 362. ?NT proBNP 2741. ?Hemoglobin A1c 6 ? ?Collected: February 19, 2021. ?BUN 36, creatinine 2.19 mg/dL. ?eGFR 31 ?Sodium 144, potassium 4.5, chloride 109, bicarbonate 25. ?AST 16, ALT 13, alkaline phosphatase 81. ?PSA 15. ?NT proBNP 3304. ?Uric acid 7.7 ? ? ?IMPRESSION: ? ?  ICD-10-CM   ?1. Dyspnea on exertion  R06.09 PCV ECHOCARDIOGRAM COMPLETE  ?  CT CARDIAC SCORING ([facility] LOCATIONS ONLY)  ?  Pro b natriuretic peptide (BNP)  ?  ?2. Primary hypertension  I10 EKG 12-Lead  ?  ?3. Stage 3b chronic kidney disease ([place])  N18.32   ?  ?4. Screening for lipid disorders  Z13.220 Lipid Panel With LDL/HDL Ratio  ?  LDL cholesterol, direct  ?  CMP14+EGFR  ?  Lipoprotein A (LPA)  ?  Apo A1 + B + Ratio  ?  ?5. Central sleep apnea  G47.31   ?  ?6. Prediabetes  R73.03 CT CARDIAC SCORING ([facility] LOCATIONS ONLY)  ?  ?  ? ?RECOMMENDATIONS: ?Joel Duarte is a 76 y.o. African-American male whose past medical history and cardiac risk factors include: Hypertension, erectile dysfunction, gout, history of seizures, central sleep apnea, prediabetes, chronic kidney disease stage IIIb, advanced age. ? ?  Dyspnea on exertion ?Chronic/progressive ?Check BNP ?Medications reconciled. ?Continue Lasix 20 mg p.o. nightly. ?Coronary calcium score for further risk stratification ?We will hold off on the results of the echocardiogram prior to uptitrating medical therapy given his chronic kidney disease stage IIIb. ? ?Primary hypertension with chronic kidney disease stage IIIb ?Office blood pressures are not well controlled. ?Patient has not taken his morning medications. ?Patient is asked to keep a log of his blood pressures and bring it in at the next office visit. ?Reemphasized importance of low-salt diet. ?Monitor for now ?Currently managed by primary care provider. ? ?Screening for lipid disorders ?Check fasting lipid profile, direct LDL, CMP, LP(a), apolipoprotein B's ?Further recommendations to follow ? ?Central sleep apnea ?Patient is compliant with daily CPAP use. ? ?Prediabetes ?Currently being monitored by PCP.   Not on medical therapy ? ?FINAL MEDICATION LIST END OF ENCOUNTER: ?No orders of the defined types were placed in this encounter. ?  ? ? ?Current Outpatient Medications:  ?  allopurinol (ZYLOPRIM) 100 MG ta

## 2021-04-07 ENCOUNTER — Encounter: Payer: Self-pay | Admitting: Urology

## 2021-04-09 LAB — LDL CHOLESTEROL, DIRECT: LDL Direct: 117 mg/dL — ABNORMAL HIGH (ref 0–99)

## 2021-04-09 LAB — APO A1 + B + RATIO
Apolipo. B/A-1 Ratio: 0.7 ratio (ref 0.0–0.7)
Apolipoprotein A-1: 141 mg/dL (ref 101–178)
Apolipoprotein B: 95 mg/dL — ABNORMAL HIGH (ref <90)

## 2021-04-09 LAB — CMP14+EGFR
ALT: 19 IU/L (ref 0–44)
AST: 27 IU/L (ref 0–40)
Albumin/Globulin Ratio: 1.4 (ref 1.2–2.2)
Albumin: 4.2 g/dL (ref 3.7–4.7)
Alkaline Phosphatase: 104 IU/L (ref 44–121)
BUN/Creatinine Ratio: 12 (ref 10–24)
BUN: 26 mg/dL (ref 8–27)
Bilirubin Total: 1.8 mg/dL — ABNORMAL HIGH (ref 0.0–1.2)
CO2: 21 mmol/L (ref 20–29)
Calcium: 9 mg/dL (ref 8.6–10.2)
Chloride: 106 mmol/L (ref 96–106)
Creatinine, Ser: 2.09 mg/dL — ABNORMAL HIGH (ref 0.76–1.27)
Globulin, Total: 3.1 g/dL (ref 1.5–4.5)
Glucose: 108 mg/dL — ABNORMAL HIGH (ref 70–99)
Potassium: 3.8 mmol/L (ref 3.5–5.2)
Sodium: 142 mmol/L (ref 134–144)
Total Protein: 7.3 g/dL (ref 6.0–8.5)
eGFR: 33 mL/min/{1.73_m2} — ABNORMAL LOW (ref >59)

## 2021-04-09 LAB — LIPID PANEL WITH LDL/HDL RATIO
Cholesterol, Total: 178 mg/dL (ref 100–199)
HDL: 57 mg/dL (ref >39)
LDL Chol Calc (NIH): 111 mg/dL — ABNORMAL HIGH (ref 0–99)
LDL/HDL Ratio: 1.9 ratio (ref 0.0–3.6)
Triglycerides: 50 mg/dL (ref 0–149)
VLDL Cholesterol Cal: 10 mg/dL (ref 5–40)

## 2021-04-09 LAB — LIPOPROTEIN A (LPA): Lipoprotein (a): 104.9 nmol/L — ABNORMAL HIGH (ref <75.0)

## 2021-04-09 LAB — PRO B NATRIURETIC PEPTIDE: NT-Pro BNP: 4300 pg/mL — ABNORMAL HIGH (ref 0–376)

## 2021-04-10 ENCOUNTER — Other Ambulatory Visit: Payer: Self-pay

## 2021-04-10 ENCOUNTER — Ambulatory Visit: Payer: Medicare Other

## 2021-04-10 DIAGNOSIS — R0609 Other forms of dyspnea: Secondary | ICD-10-CM

## 2021-04-13 ENCOUNTER — Ambulatory Visit
Admission: RE | Admit: 2021-04-13 | Discharge: 2021-04-13 | Disposition: A | Discharge status: Home / Self Care | Payer: No Typology Code available for payment source | Source: Ambulatory Visit | Attending: Cardiology | Admitting: Cardiology

## 2021-04-17 ENCOUNTER — Encounter: Payer: Self-pay | Admitting: Cardiology

## 2021-04-17 ENCOUNTER — Ambulatory Visit: Payer: Medicare Other | Admitting: Cardiology

## 2021-04-17 ENCOUNTER — Other Ambulatory Visit (HOSPITAL_COMMUNITY): Payer: Self-pay

## 2021-04-17 VITALS — BP 140/87 | HR 68 | Temp 98.0°F | Resp 16 | Ht 74.0 in | Wt 222.0 lb

## 2021-04-17 DIAGNOSIS — N183 Chronic kidney disease, stage 3 unspecified: Secondary | ICD-10-CM

## 2021-04-17 DIAGNOSIS — G4731 Primary central sleep apnea: Secondary | ICD-10-CM

## 2021-04-17 DIAGNOSIS — R911 Solitary pulmonary nodule: Secondary | ICD-10-CM

## 2021-04-17 DIAGNOSIS — I2510 Atherosclerotic heart disease of native coronary artery without angina pectoris: Secondary | ICD-10-CM

## 2021-04-17 DIAGNOSIS — E7800 Pure hypercholesterolemia, unspecified: Secondary | ICD-10-CM

## 2021-04-17 DIAGNOSIS — I5041 Acute combined systolic (congestive) and diastolic (congestive) heart failure: Secondary | ICD-10-CM

## 2021-04-17 DIAGNOSIS — R7303 Prediabetes: Secondary | ICD-10-CM

## 2021-04-17 DIAGNOSIS — E7841 Elevated Lipoprotein(a): Secondary | ICD-10-CM

## 2021-04-17 DIAGNOSIS — C61 Malignant neoplasm of prostate: Secondary | ICD-10-CM

## 2021-04-17 DIAGNOSIS — I429 Cardiomyopathy, unspecified: Secondary | ICD-10-CM

## 2021-04-17 MED ORDER — ASPIRIN EC 81 MG PO TBEC
81.0000 mg | DELAYED_RELEASE_TABLET | Freq: Every day | ORAL | 11 refills | Status: DC
  Filled 2021-04-17: qty 30, 30d supply, fill #0

## 2021-04-17 MED ORDER — AZITHROMYCIN 250 MG PO TABS
500.0000 mg | ORAL_TABLET | Freq: Every day | ORAL | 0 refills | Status: DC
  Filled 2021-04-17: qty 6, 5d supply, fill #0

## 2021-04-17 MED ORDER — ATORVASTATIN CALCIUM 40 MG PO TABS
40.0000 mg | ORAL_TABLET | Freq: Every day | ORAL | 0 refills | Status: DC
  Filled 2021-04-17: qty 90, 90d supply, fill #0

## 2021-04-17 MED ORDER — ENTRESTO 49-51 MG PO TABS
1.0000 | ORAL_TABLET | Freq: Two times a day (BID) | ORAL | 0 refills | Status: DC
  Filled 2021-04-17: qty 180, 90d supply, fill #0
  Filled 2021-04-17: qty 60, 30d supply, fill #0

## 2021-04-17 MED ORDER — DAPAGLIFLOZIN PROPANEDIOL 10 MG PO TABS
10.0000 mg | ORAL_TABLET | Freq: Every day | ORAL | 0 refills | Status: DC
  Filled 2021-04-17: qty 30, 30d supply, fill #0
  Filled 2021-04-17: qty 90, 90d supply, fill #0
  Filled 2021-05-26: qty 30, 30d supply, fill #1
  Filled 2021-07-03: qty 30, 30d supply, fill #2

## 2021-04-17 NOTE — Progress Notes (Signed)
? ?Date:  04/17/2021  ? ?ID:  Erie Joel Duarte, DOB 05-May-1945, MRN [id number] ? ?PCP:  Joel Blocker, MD  ?Cardiologist:  Joel Kras, DO, [facility] (established care April 03, 2021) ? ?Date: 04/17/21 ?Last Office Visit: 04/03/2021 ? ?Chief Complaint  ?Patient presents with  ? Shortness of Breath  ? Follow-up  ?  2 week  ? ? ?HPI  ?Joel Duarte is a 76 y.o. African-American male whose past medical history and cardiovascular risk factors include: Cardiomyopathy, chronic combined systolic and diastolic heart failure, severe coronary artery calcification (total CAC 1130/93rd percentile), elevated LP(a), hypertension with chronic kidney disease stage IIIb, erectile dysfunction, gout, history of seizures, central sleep apnea, prediabetes, prostate cancer, pulmonary nodules, advanced age. ? ?He is referred to the office at the request of Joel Blocker, MD for evaluation of elevated BNP. ? ?Patient was started on Lasix under the care of his PCP and had diuresed approximately 10 pounds but continued to feel tired, fatigued, decreased physical endurance and therefore was referred to cardiology for further evaluation and management.  Patient's medications were further uptitrated at the last office visit was recommended to undergo an echo and coronary calcium score. ? ?Since last office visit patient has lost an additional 10 pounds and states that he feels better.  He has been keeping a home blood pressure and weight log and has noted that if his morning weight is around 220 pounds he feels good and that higher the weight no more short of breath he feels.  Home blood pressure log notes a systolic BP of 485-462 mmHg.  Echocardiogram since last office visit notes severely reduced LVEF, grade 1 diastolic impairment, and valvular heart disease.  Coronary calcium score notes severe coronary artery calcification with a total CAC of 1130, placing him in the 93rd percentile.  He also has noncardiac findings of pulmonary nodules as well as  sclerotic foci noted in the vertebral body.  These findings were discussed with the patient in great detail at today's office visit and noted below for further reference. ? ?Fasting lipid profile notes an LDL 117 mg/dL with a LP(a) of 104.9. ? ?FUNCTIONAL STATUS: ?No structured exercise program or daily routine.  ? ?ALLERGIES: ?No Known Allergies ? ?MEDICATION LIST PRIOR TO VISIT: ?Current Meds  ?Medication Sig  ? allopurinol (ZYLOPRIM) 100 MG tablet Take 100 mg by mouth daily.  ? aspirin EC 81 MG tablet Take 1 tablet (81 mg total) by mouth daily. Swallow whole.  ? atorvastatin (LIPITOR) 40 MG tablet Take 1 tablet (40 mg total) by mouth at bedtime.  ? azelastine (ASTELIN) 0.1 % nasal spray USE ONE PUFF IN EACH NOSTRIL TWICE A DAY AS NEEDED  ? carvedilol (COREG) 25 MG tablet Take 1 tablet (25 mg total) by mouth 2 (two) times daily.  ? dapagliflozin propanediol (FARXIGA) 10 MG TABS tablet Take 1 tablet (10 mg total) by mouth daily before breakfast.  ? diclofenac Sodium (VOLTAREN) 1 % GEL   ? doxazosin (CARDURA) 8 MG tablet TAKE 1 TABLET BY MOUTH IN MORNING AND 1/2 TABLET IN THE EVENING  ? dutasteride (AVODART) 0.5 MG capsule Take 1 capsule (0.5 mg total) by mouth daily.  ? fluticasone (FLONASE) 50 MCG/ACT nasal spray USE 1 SPRAY INTO EACH NOSTRIL TWO TIMES DAILY AS NEEDED  ? levETIRAcetam (KEPPRA) 750 MG tablet Take 1 tablet (750 mg total) by mouth 2 (two) times daily.  ? loratadine (CLARITIN) 10 MG tablet Take 1 tablet by mouth daily  ? sacubitril-valsartan (ENTRESTO) 49-51 MG  Take 1 tablet by mouth 2 (two) times daily.  ? sildenafil (VIAGRA) 100 MG tablet Take 1 tablet by mouth 2 hours before sexual activity, once daily at the most  ? [DISCONTINUED] amLODipine (NORVASC) 5 MG tablet Take 1 tablet (5 mg total) by mouth daily.  ? [DISCONTINUED] furosemide (LASIX) 20 MG tablet   ? [DISCONTINUED] losartan (COZAAR) 100 MG tablet TAKE 1 TABLET BY MOUTH ONCE DAILY  ?  ? ?PAST MEDICAL HISTORY: ?Past Medical History:   ?Diagnosis Date  ? Allergy   ? Enlarged prostate   ? Gout   ? Hypertension   ? Osteoarthrosis, unspecified whether generalized or localized, unspecified site   ? Seizures ([place])   ? history of 1 a year ago  ? ? ?PAST SURGICAL HISTORY: ?Past Surgical History:  ?Procedure Laterality Date  ? no surgical h/o  June 2014  ? ? ?FAMILY HISTORY: ?The patient family history includes Healthy in his child and sister; Heart Problems in his mother; Hypertension in his mother. ? ?SOCIAL HISTORY:  ?The patient  reports that he has never smoked. He has never used smokeless tobacco. He reports that he does not currently use alcohol. He reports that he does not use drugs. ? ?REVIEW OF SYSTEMS: ?Review of Systems  ?Constitutional: Positive for malaise/fatigue (improved).  ?Cardiovascular:  Negative for chest pain, dyspnea on exertion, leg swelling, orthopnea, palpitations, paroxysmal nocturnal dyspnea and syncope.  ?Respiratory:  Negative for shortness of breath.   ? ?PHYSICAL EXAM: ? ?  04/17/2021  ?  2:18 PM 04/03/2021  ?  9:16 AM 04/03/2021  ?  9:09 AM  ?Vitals with BMI  ?Height '6\' 2"'$   '6\' 2"'$   ?Weight 222 lbs  233 lbs 3 oz  ?BMI 28.49  29.93  ?Systolic 825 053 976  ?Diastolic 87 92 87  ?Pulse 68 71 78  ? ? ?CONSTITUTIONAL: Well-developed and well-nourished. No acute distress.  ?SKIN: Skin is warm and dry. No rash noted. No cyanosis. No pallor. No jaundice ?HEAD: Normocephalic and atraumatic.  ?EYES: No scleral icterus ?MOUTH/THROAT: Moist oral membranes.  ?NECK: No JVD present. No thyromegaly noted. No carotid bruits  ?LYMPHATIC: No visible cervical adenopathy.  ?CHEST Normal respiratory effort. No intercostal retractions  ?LUNGS: Clear to auscultation bilaterally.  No stridor. No wheezes. No rales.  ?CARDIOVASCULAR: Regular rate and rhythm, positive B3-A1, soft holosystolic murmur heard at the apex, no rubs or gallops appreciated. ?ABDOMINAL:  soft, nontender, nondistended, positive bowel sounds all 4 quadrants. No apparent ascites.   ?EXTREMITIES: 1+ bilateral peripheral edema, warm to touch, compression stockings present ?HEMATOLOGIC: No significant bruising ?NEUROLOGIC: Oriented to person, place, and time. Nonfocal. Normal muscle tone.  ?PSYCHIATRIC: Normal mood and affect. Normal behavior. Cooperative ? ?CARDIAC DATABASE: ?EKG: ?04/03/2021: NSR, 66 bpm, first-degree AV block, left axis, possible old anteroseptal infarct, nonspecific T wave changes. ? ?Echocardiogram: ?04/10/2021:  ?Left ventricle cavity is normal in size. Severe concentric hypertrophy of the left ventricle. Speckled pattern suggests infiltrative cardiomyopathy.  ?Hypokinetic global wall motion. Doppler evidence of grade I (impaired) diastolic dysfunction, normal LAP. Severely depressed LV systolic function with visual EF 25-30%. Calculated EF 30%.  ?Left atrial cavity is severely dilated at 5.2 cm. Right atrial cavity is mildly dilated.  ?Right ventricle cavity is normal in size. Mild concentric hypertrophy of the right ventricle. Severely reduced right ventricular function.  ?Trileaflet aortic valve.  Trace aortic regurgitation.  ?Structurally normal mitral valve.  Moderate (Grade II) mitral regurgitation.  ?Structurally normal tricuspid valve.  Moderate tricuspid regurgitation.  ?Severe  pulmonary hypertension. RVSP measures 72 mmHg.  ?Pericardium is normal. Insignificant pericardial effusion.  ?IVC is dilated with poor inspiration collapse consistent with elevated right atrial pressure. ?  ?Stress Testing: ?No results found for this or any previous visit from the past 1095 days. ? ? ?Heart Catheterization: ?None ? ?CT Cardiac Scoring ?04/13/2021 ?1. Coronary calcium score is 1130 and this is at percentile 93 for patients of the same age, gender and ethnicity. ? ?2. Nonspecific patchy densities at the lung bases which could represent combination of chronic changes and/or atelectasis. Inaddition, there are small indeterminate nodule, largest measures 5 mm. No follow-up needed  if patient is low-risk (and has no known or suspected primary neoplasm). Non-contrast chest CT can be considered in 12 months if patient is high-risk. This recommendation follows the consensus statement: Guidelines for

## 2021-04-19 ENCOUNTER — Other Ambulatory Visit (HOSPITAL_COMMUNITY): Payer: Self-pay

## 2021-04-19 MED ORDER — CARVEDILOL 25 MG PO TABS
25.0000 mg | ORAL_TABLET | Freq: Two times a day (BID) | ORAL | 2 refills | Status: DC
  Filled 2021-04-19: qty 180, 90d supply, fill #0
  Filled 2021-07-16: qty 180, 90d supply, fill #1
  Filled 2021-10-20: qty 180, 90d supply, fill #2

## 2021-04-20 ENCOUNTER — Other Ambulatory Visit: Payer: Self-pay

## 2021-04-20 ENCOUNTER — Other Ambulatory Visit: Payer: Self-pay | Admitting: Internal Medicine

## 2021-04-20 ENCOUNTER — Other Ambulatory Visit (HOSPITAL_COMMUNITY): Payer: Self-pay

## 2021-04-20 DIAGNOSIS — G44009 Cluster headache syndrome, unspecified, not intractable: Secondary | ICD-10-CM

## 2021-04-20 DIAGNOSIS — I5041 Acute combined systolic (congestive) and diastolic (congestive) heart failure: Secondary | ICD-10-CM

## 2021-04-20 DIAGNOSIS — I429 Cardiomyopathy, unspecified: Secondary | ICD-10-CM

## 2021-04-21 ENCOUNTER — Telehealth: Payer: Self-pay

## 2021-04-21 NOTE — Telephone Encounter (Signed)
Entresto 49/51 mg p.o. twice daily prescribed to him at the last office visit.  Tolerated it well.  Recently has been noticing numbness/tingling in his arms and hand.  ? ?He decided to take it once a day and his symptoms had improved. ? ?For now recommended taking Entresto 49/51 one half a tablet twice a day and will await his renal function on the current dose and reevaluate his numbness and tingling.  Numbness and tingling not noted as one of the adverse effects noted on [facility]. ? ?Jossie Smoot, DO, FACC.

## 2021-04-28 LAB — BASIC METABOLIC PANEL
BUN/Creatinine Ratio: 11 (ref 10–24)
BUN: 23 mg/dL (ref 8–27)
CO2: 16 mmol/L — ABNORMAL LOW (ref 20–29)
Calcium: 9 mg/dL (ref 8.6–10.2)
Chloride: 110 mmol/L — ABNORMAL HIGH (ref 96–106)
Creatinine, Ser: 2.01 mg/dL — ABNORMAL HIGH (ref 0.76–1.27)
Glucose: 118 mg/dL — ABNORMAL HIGH (ref 70–99)
Potassium: 4.1 mmol/L (ref 3.5–5.2)
Sodium: 144 mmol/L (ref 134–144)
eGFR: 34 mL/min/{1.73_m2} — ABNORMAL LOW (ref >59)

## 2021-04-28 LAB — MAGNESIUM: Magnesium: 2.2 mg/dL (ref 1.6–2.3)

## 2021-04-28 LAB — PRO B NATRIURETIC PEPTIDE: NT-Pro BNP: 4660 pg/mL — ABNORMAL HIGH (ref 0–376)

## 2021-04-29 NOTE — Progress Notes (Signed)
Called and reviewed lab results with pt. Nt-proBNP and renal function continues to remain elevated despite decreasing his entresto dose from 49/51 mg to 24/26 mg BID. Home weight readings per pt continues to remain ~225-228 lbs. Hasnt noticed any significant changes in his weight since starting Entresto. Per pt, no changes in his complains of dyspnea, fatigueness, SOB. Denies any complains of worsening lower extremity edema. Pt reports that he continues to wear his compression stockings regularly and has been making an active effort of limiting his salt intake. Recommended pt stop Entresto due to significant elevation in his renal function. May consider getting repeat labs in 1 week, if renal functions improves, may consider diuresing with lasix to address elevated NT-ProBNP.

## 2021-05-06 ENCOUNTER — Ambulatory Visit: Payer: Medicare Other

## 2021-05-06 DIAGNOSIS — I2510 Atherosclerotic heart disease of native coronary artery without angina pectoris: Secondary | ICD-10-CM

## 2021-05-06 DIAGNOSIS — I429 Cardiomyopathy, unspecified: Secondary | ICD-10-CM

## 2021-05-06 LAB — MAGNESIUM: Magnesium: 2.2 mg/dL (ref 1.6–2.3)

## 2021-05-08 ENCOUNTER — Other Ambulatory Visit: Payer: Self-pay | Admitting: Pharmacist

## 2021-05-11 ENCOUNTER — Ambulatory Visit: Payer: Medicare Other | Admitting: Cardiology

## 2021-05-11 ENCOUNTER — Encounter: Payer: Self-pay | Admitting: Cardiology

## 2021-05-11 ENCOUNTER — Other Ambulatory Visit (HOSPITAL_COMMUNITY): Payer: Self-pay

## 2021-05-11 VITALS — BP 167/103 | HR 67 | Temp 98.0°F | Resp 16 | Ht 74.0 in | Wt 232.6 lb

## 2021-05-11 DIAGNOSIS — I130 Hypertensive heart and chronic kidney disease with heart failure and stage 1 through stage 4 chronic kidney disease, or unspecified chronic kidney disease: Secondary | ICD-10-CM

## 2021-05-11 DIAGNOSIS — G4731 Primary central sleep apnea: Secondary | ICD-10-CM

## 2021-05-11 DIAGNOSIS — C61 Malignant neoplasm of prostate: Secondary | ICD-10-CM

## 2021-05-11 DIAGNOSIS — R911 Solitary pulmonary nodule: Secondary | ICD-10-CM

## 2021-05-11 DIAGNOSIS — I2510 Atherosclerotic heart disease of native coronary artery without angina pectoris: Secondary | ICD-10-CM

## 2021-05-11 DIAGNOSIS — I5041 Acute combined systolic (congestive) and diastolic (congestive) heart failure: Secondary | ICD-10-CM

## 2021-05-11 DIAGNOSIS — R7303 Prediabetes: Secondary | ICD-10-CM

## 2021-05-11 DIAGNOSIS — E7800 Pure hypercholesterolemia, unspecified: Secondary | ICD-10-CM

## 2021-05-11 DIAGNOSIS — E7841 Elevated Lipoprotein(a): Secondary | ICD-10-CM

## 2021-05-11 DIAGNOSIS — I429 Cardiomyopathy, unspecified: Secondary | ICD-10-CM

## 2021-05-11 MED ORDER — TORSEMIDE 10 MG PO TABS
10.0000 mg | ORAL_TABLET | Freq: Every morning | ORAL | 0 refills | Status: DC
  Filled 2021-05-11: qty 30, 30d supply, fill #0

## 2021-05-11 NOTE — Progress Notes (Signed)
? ?ID:  Rahmon Heigl, DOB 10/31/1945, MRN [id number] ? ?PCP:  Rogers Blocker, MD  ?Cardiologist:  Rex Kras, DO, [facility] (established care April 03, 2021) ? ?Date: 05/11/21 ?Last Office Visit: 04/17/2021 ? ?Chief Complaint  ?Patient presents with  ? Cardiomyopathy  ? Results  ?  heart failure management  ? Follow-up  ? ? ?HPI  ?Joel Duarte is a 76 y.o. African-American male whose past medical history and cardiovascular risk factors include: Cardiomyopathy, chronic combined systolic and diastolic heart failure, severe coronary artery calcification (total CAC 1130/93rd percentile), elevated LP(a), hypertension with chronic kidney disease stage IIIb, erectile dysfunction, gout, history of seizures, central sleep apnea, prediabetes, prostate cancer, pulmonary nodules, advanced age. ? ?Patient is being followed by the practice for management of systolic and diastolic heart failure and severe coronary artery calcification.  Since last office visit patient has gained approximately 6 pounds and states that he is still making good urine.  He stopped taking Entresto on April 29, 2021 due to tingling sensation in his hands.  He denies angina pectoris or shortness of breath at rest or with effort related activities.  He continues to have lower extremity swelling and a degree of PND and orthopnea. ? ?Independently reviewed the results of the nuclear stress test with him in great detail and noted below for further reference. ? ?FUNCTIONAL STATUS: ?No structured exercise program or daily routine.  ? ?ALLERGIES: ?No Known Allergies ? ?MEDICATION LIST PRIOR TO VISIT: ?Current Meds  ?Medication Sig  ? allopurinol (ZYLOPRIM) 100 MG tablet Take 100 mg by mouth daily.  ? aspirin EC 81 MG tablet Take 1 tablet (81 mg total) by mouth daily. Swallow whole.  ? atorvastatin (LIPITOR) 40 MG tablet Take 1 tablet (40 mg total) by mouth at bedtime.  ? azelastine (ASTELIN) 0.1 % nasal spray USE ONE PUFF IN EACH NOSTRIL TWICE A DAY AS NEEDED  ?  carvedilol (COREG) 25 MG tablet Take 1 tablet (25 mg total) by mouth 2 (two) times daily.  ? dapagliflozin propanediol (FARXIGA) 10 MG TABS tablet Take 1 tablet (10 mg total) by mouth daily before breakfast.  ? doxazosin (CARDURA) 8 MG tablet TAKE 1 TABLET BY MOUTH IN MORNING AND 1/2 TABLET IN THE EVENING  ? dutasteride (AVODART) 0.5 MG capsule Take 1 capsule (0.5 mg total) by mouth daily.  ? fluticasone (FLONASE) 50 MCG/ACT nasal spray USE 1 SPRAY INTO EACH NOSTRIL TWO TIMES DAILY AS NEEDED  ? levETIRAcetam (KEPPRA) 750 MG tablet Take 1 tablet (750 mg total) by mouth 2 (two) times daily.  ? sildenafil (VIAGRA) 100 MG tablet Take 1 tablet by mouth 2 hours before sexual activity, once daily at the most  ? torsemide (DEMADEX) 10 MG tablet Take 1 tablet (10 mg total) by mouth every morning.  ?  ? ?PAST MEDICAL HISTORY: ?Past Medical History:  ?Diagnosis Date  ? Allergy   ? Enlarged prostate   ? Gout   ? Hypertension   ? Osteoarthrosis, unspecified whether generalized or localized, unspecified site   ? Seizures ([place])   ? history of 1 a year ago  ? ? ?PAST SURGICAL HISTORY: ?Past Surgical History:  ?Procedure Laterality Date  ? no surgical h/o  June 2014  ? ? ?FAMILY HISTORY: ?The patient family history includes Healthy in his child and sister; Heart Problems in his mother; Hypertension in his mother. ? ?SOCIAL HISTORY:  ?The patient  reports that he has never smoked. He has never used smokeless tobacco. He reports that he does  not currently use alcohol. He reports that he does not use drugs. ? ?REVIEW OF SYSTEMS: ?Review of Systems  ?Constitutional: Positive for malaise/fatigue (improved).  ?Cardiovascular:  Positive for leg swelling, orthopnea and paroxysmal nocturnal dyspnea. Negative for chest pain, dyspnea on exertion, palpitations and syncope.  ?Respiratory:  Negative for shortness of breath.   ? ?PHYSICAL EXAM: ? ?  05/11/2021  ?  2:21 PM 05/11/2021  ?  2:19 PM 04/17/2021  ?  2:18 PM  ?Vitals with BMI  ?Height  '6\' 2"'$   '6\' 2"'$   ?Weight  232 lbs 10 oz 222 lbs  ?BMI  29.85 28.49  ?Systolic 350 093 818  ?Diastolic 299 83 87  ?Pulse 67 69 68  ? ? ?CONSTITUTIONAL: Well-developed and well-nourished. No acute distress.  ?SKIN: Skin is warm and dry. No rash noted. No cyanosis. No pallor. No jaundice ?HEAD: Normocephalic and atraumatic.  ?EYES: No scleral icterus ?MOUTH/THROAT: Moist oral membranes.  ?NECK: No JVD present. No thyromegaly noted. No carotid bruits  ?LYMPHATIC: No visible cervical adenopathy.  ?CHEST Normal respiratory effort. No intercostal retractions  ?LUNGS: Clear to auscultation bilaterally.  No stridor. No wheezes. No rales.  ?CARDIOVASCULAR: Regular rate and rhythm, positive B7-J6, soft holosystolic murmur heard at the apex, no rubs or gallops appreciated. ?ABDOMINAL:  soft, nontender, nondistended, positive bowel sounds all 4 quadrants. No apparent ascites.  ?EXTREMITIES: 2+ bilateral peripheral edema, warm to touch, compression stockings present ?HEMATOLOGIC: No significant bruising ?NEUROLOGIC: Oriented to person, place, and time. Nonfocal. Normal muscle tone.  ?PSYCHIATRIC: Normal mood and affect. Normal behavior. Cooperative ? ?CARDIAC DATABASE: ?EKG: ?04/03/2021: NSR, 66 bpm, first-degree AV block, left axis, possible old anteroseptal infarct, nonspecific T wave changes. ? ?Echocardiogram: ?04/10/2021:  ?Left ventricle cavity is normal in size. Severe concentric hypertrophy of the left ventricle. Speckled pattern suggests infiltrative cardiomyopathy.  ?Hypokinetic global wall motion. Doppler evidence of grade I (impaired) diastolic dysfunction, normal LAP. Severely depressed LV systolic function with visual EF 25-30%. Calculated EF 30%.  ?Left atrial cavity is severely dilated at 5.2 cm. Right atrial cavity is mildly dilated.  ?Right ventricle cavity is normal in size. Mild concentric hypertrophy of the right ventricle. Severely reduced right ventricular function.  ?Trileaflet aortic valve.  Trace aortic  regurgitation.  ?Structurally normal mitral valve.  Moderate (Grade II) mitral regurgitation.  ?Structurally normal tricuspid valve.  Moderate tricuspid regurgitation.  ?Severe pulmonary hypertension. RVSP measures 72 mmHg.  ?Pericardium is normal. Insignificant pericardial effusion.  ?IVC is dilated with poor inspiration collapse consistent with elevated right atrial pressure. ?  ?Stress Testing: ?Exercise/Lexiscan Tetrofosmin stress test 05/06/2021: ?Exercise nuclear stress test was performed using Bruce protocol. Patient reached 5.6 METS, and 71% of age predicted maximum heart rate. Because of sub-maximal HR the patient was injected with 0.4 mg of Intravenous Lexiscan over 15 sec. Exercise capacity was low. No chest pain reported. Dyspnea and dizziness reported. Blunted heart rate response. Unable to comment on post exercise blood pressure, as reported blood pressure after stress is only after Lexiscan injection. Stress EKG at 71% MPHR showed sinus rhythm, LAFB, occasional PVC, no ischemic changes.  ?Dilated LV cavity with moderate global decrease in myocardial wall motion and thickening. Stress LVEF 41%. SPECT images show small area of mild intensity, fixed perfusion defect in basal inferior myocardium. No definite evidence of ischemia. ?High risk study in the setting of dilated cardiomyopathy.  ? ?Heart Catheterization: ?None ? ?CT Cardiac Scoring ?04/13/2021 ?1. Coronary calcium score is 1130 and this is at percentile 93 for patients  of the same age, gender and ethnicity. ? ?2. Nonspecific patchy densities at the lung bases which could represent combination of chronic changes and/or atelectasis. Inaddition, there are small indeterminate nodule, largest measures 5 mm. No follow-up needed if patient is low-risk (and has no known or suspected primary neoplasm). Non-contrast chest CT can be considered in 12 months if patient is high-risk. This recommendation follows the consensus statement: Guidelines for Management  of Incidental Pulmonary Nodules Detected on CT Images: From the Fleischner Society 2017; Radiology 2017; 284:228-243. ? ?3. Sclerotic focus involving a thoracic vertebral body. This has benign characteristics but reco

## 2021-05-12 ENCOUNTER — Other Ambulatory Visit (HOSPITAL_COMMUNITY): Payer: Self-pay

## 2021-05-13 ENCOUNTER — Other Ambulatory Visit (HOSPITAL_COMMUNITY): Payer: Self-pay

## 2021-05-19 ENCOUNTER — Ambulatory Visit
Admission: RE | Admit: 2021-05-19 | Discharge: 2021-05-19 | Disposition: A | Discharge status: Home / Self Care | Payer: Medicare Other | Source: Ambulatory Visit | Attending: Internal Medicine | Admitting: Internal Medicine

## 2021-05-19 DIAGNOSIS — G44009 Cluster headache syndrome, unspecified, not intractable: Secondary | ICD-10-CM

## 2021-05-20 ENCOUNTER — Other Ambulatory Visit: Payer: Self-pay | Admitting: Cardiology

## 2021-05-21 ENCOUNTER — Other Ambulatory Visit (HOSPITAL_COMMUNITY): Payer: Self-pay

## 2021-05-21 LAB — BASIC METABOLIC PANEL
BUN/Creatinine Ratio: 14 (ref 10–24)
BUN: 28 mg/dL — ABNORMAL HIGH (ref 8–27)
CO2: 21 mmol/L (ref 20–29)
Calcium: 8.9 mg/dL (ref 8.6–10.2)
Chloride: 109 mmol/L — ABNORMAL HIGH (ref 96–106)
Creatinine, Ser: 2.03 mg/dL — ABNORMAL HIGH (ref 0.76–1.27)
Glucose: 104 mg/dL — ABNORMAL HIGH (ref 70–99)
Potassium: 3.9 mmol/L (ref 3.5–5.2)
Sodium: 146 mmol/L — ABNORMAL HIGH (ref 134–144)
eGFR: 34 mL/min/{1.73_m2} — ABNORMAL LOW (ref >59)

## 2021-05-21 LAB — MAGNESIUM: Magnesium: 2.1 mg/dL (ref 1.6–2.3)

## 2021-05-21 LAB — PRO B NATRIURETIC PEPTIDE: NT-Pro BNP: 8074 pg/mL — ABNORMAL HIGH (ref 0–376)

## 2021-05-21 MED ORDER — MECLIZINE HCL 12.5 MG PO TABS
12.5000 mg | ORAL_TABLET | Freq: Two times a day (BID) | ORAL | 0 refills | Status: DC | PRN
  Filled 2021-05-21: qty 50, 13d supply, fill #0

## 2021-05-21 MED ORDER — AZITHROMYCIN 250 MG PO TABS
ORAL_TABLET | ORAL | 0 refills | Status: DC
  Filled 2021-05-21: qty 6, 5d supply, fill #0

## 2021-05-22 ENCOUNTER — Other Ambulatory Visit (HOSPITAL_COMMUNITY): Payer: Self-pay

## 2021-05-26 ENCOUNTER — Other Ambulatory Visit (HOSPITAL_COMMUNITY): Payer: Self-pay

## 2021-05-29 ENCOUNTER — Other Ambulatory Visit (HOSPITAL_COMMUNITY): Payer: Self-pay

## 2021-05-29 ENCOUNTER — Encounter: Payer: Self-pay | Admitting: Cardiology

## 2021-05-29 ENCOUNTER — Ambulatory Visit: Payer: Medicare Other | Admitting: Cardiology

## 2021-05-29 VITALS — BP 153/80 | HR 73 | Temp 98.0°F | Resp 16 | Ht 74.0 in | Wt 222.0 lb

## 2021-05-29 DIAGNOSIS — I429 Cardiomyopathy, unspecified: Secondary | ICD-10-CM

## 2021-05-29 DIAGNOSIS — I2510 Atherosclerotic heart disease of native coronary artery without angina pectoris: Secondary | ICD-10-CM

## 2021-05-29 DIAGNOSIS — E7800 Pure hypercholesterolemia, unspecified: Secondary | ICD-10-CM

## 2021-05-29 DIAGNOSIS — I5041 Acute combined systolic (congestive) and diastolic (congestive) heart failure: Secondary | ICD-10-CM

## 2021-05-29 MED ORDER — TORSEMIDE 10 MG PO TABS: 10.0000 mg | ORAL_TABLET | Freq: Every day | ORAL | 0 refills | Status: DC | PRN

## 2021-05-29 MED ORDER — SPIRONOLACTONE 25 MG PO TABS
12.5000 mg | ORAL_TABLET | Freq: Every day | ORAL | 3 refills | Status: DC
  Filled 2021-05-29: qty 15, 30d supply, fill #0

## 2021-05-29 NOTE — Progress Notes (Signed)
? ?ID:  Joel Duarte, DOB 1945/04/20, MRN [id number] ? ?PCP:  Rogers Blocker, MD  ?Cardiologist:  Alethia Berthold, DO, [facility] (established care April 03, 2021) ? ?Date: 05/11/21 ?Last Office Visit: 04/17/2021 ? ?Chief Complaint  ?Patient presents with  ? Follow-up  ? Congestive Heart Failure  ?  2 WEEKS  ? ? ?HPI  ?Joel Duarte is a 76 y.o. African-American male whose past medical history and cardiovascular risk factors include: Cardiomyopathy, chronic combined systolic and diastolic heart failure, severe coronary artery calcification (total CAC 1130/93rd percentile), elevated LP(a), hypertension with chronic kidney disease stage IIIb, erectile dysfunction, gout, history of seizures, central sleep apnea, prediabetes, prostate cancer, pulmonary nodules, advanced age. ? ?Patient is being followed by the practice for management of systolic and diastolic heart failure and severe coronary artery calcification.  Patient presented 05/11/2021 having stopped Entresto on 04/29/2021 due to a tingling sensation in his hands, patient had gained approximately 6 pounds and was complaining of lower extremity swelling as well as PND and orthopnea.  At last office visit stress test was noted to be high risk given findings of cardiomyopathy but no definitive evidence of reversible ischemia, and given renal function shared decision was to hold off on cardiac cath or CTA.  At last visit started torsemide 10 mg p.o. daily as well as atorvastatin 40 mg p.o. daily.  Repeat labs showed proBNP uptrending, renal function stable, and magnesium within normal limits. ? ?Patient now presents for 2 week follow up.  Patient is feeling significantly improved since last office visit.  He has had significant improvement of leg swelling.  Shortness of breath and orthopnea have resolved.  Weight has trended down 10 pounds.  Patient reports he is being very strict with DASH diet. ? ?FUNCTIONAL STATUS: ?No structured exercise program or daily routine.   ? ?ALLERGIES: ?No Known Allergies ? ?MEDICATION LIST PRIOR TO VISIT: ?Current Meds  ?Medication Sig  ? allopurinol (ZYLOPRIM) 100 MG tablet Take 100 mg by mouth daily.  ? aspirin EC 81 MG tablet Take 1 tablet (81 mg total) by mouth daily. Swallow whole.  ? atorvastatin (LIPITOR) 40 MG tablet Take 1 tablet (40 mg total) by mouth at bedtime.  ? azelastine (ASTELIN) 0.1 % nasal spray USE ONE PUFF IN EACH NOSTRIL TWICE A DAY AS NEEDED  ? carvedilol (COREG) 25 MG tablet Take 1 tablet (25 mg total) by mouth 2 (two) times daily.  ? dapagliflozin propanediol (FARXIGA) 10 MG TABS tablet Take 1 tablet (10 mg total) by mouth daily before breakfast.  ? doxazosin (CARDURA) 8 MG tablet TAKE 1 TABLET BY MOUTH IN MORNING AND 1/2 TABLET IN THE EVENING  ? dutasteride (AVODART) 0.5 MG capsule Take 1 capsule (0.5 mg total) by mouth daily.  ? fluticasone (FLONASE) 50 MCG/ACT nasal spray USE 1 SPRAY INTO EACH NOSTRIL TWO TIMES DAILY AS NEEDED  ? levETIRAcetam (KEPPRA) 750 MG tablet Take 1 tablet (750 mg total) by mouth 2 (two) times daily.  ? meclizine (ANTIVERT) 12.5 MG tablet Take 1-2 tablets (12.5-25 mg total) by mouth 2 (two) times daily as needed for dizziness  ? spironolactone (ALDACTONE) 25 MG tablet Take 0.5 tablets (12.5 mg total) by mouth daily.  ? [DISCONTINUED] torsemide (DEMADEX) 10 MG tablet Take 1 tablet (10 mg total) by mouth every morning.  ?  ? ?PAST MEDICAL HISTORY: ?Past Medical History:  ?Diagnosis Date  ? Allergy   ? Enlarged prostate   ? Gout   ? Hypertension   ? Osteoarthrosis, unspecified  whether generalized or localized, unspecified site   ? Seizures ([place])   ? history of 1 a year ago  ? ? ?PAST SURGICAL HISTORY: ?Past Surgical History:  ?Procedure Laterality Date  ? no surgical h/o  June 2014  ? ? ?FAMILY HISTORY: ?The patient family history includes Healthy in his child and sister; Heart Problems in his mother; Hypertension in his mother. ? ?SOCIAL HISTORY:  ?The patient  reports that he has never smoked. He  has never used smokeless tobacco. He reports that he does not currently use alcohol. He reports that he does not use drugs. ? ?REVIEW OF SYSTEMS: ?Review of Systems  ?Constitutional: Positive for malaise/fatigue (improved).  ?Cardiovascular:  Positive for leg swelling (significantly improved). Negative for chest pain, dyspnea on exertion, orthopnea (resolved), palpitations, paroxysmal nocturnal dyspnea (resolved) and syncope.  ?Respiratory:  Negative for shortness of breath.   ? ?PHYSICAL EXAM: ? ?  05/29/2021  ?  1:24 PM 05/11/2021  ?  2:21 PM 05/11/2021  ?  2:19 PM  ?Vitals with BMI  ?Height '6\' 2"'$   '6\' 2"'$   ?Weight 222 lbs  232 lbs 10 oz  ?BMI 28.49  29.85  ?Systolic 678 938 101  ?Diastolic 80 751 83  ?Pulse 73 67 69  ? ? ?CONSTITUTIONAL: Well-developed and well-nourished. No acute distress.  ?SKIN: Skin is warm and dry. No rash noted. No cyanosis. No pallor. No jaundice ?HEAD: Normocephalic and atraumatic.  ?EYES: No scleral icterus ?MOUTH/THROAT: Moist oral membranes.  ?NECK: No JVD present. No thyromegaly noted. No carotid bruits  ?LYMPHATIC: No visible cervical adenopathy.  ?CHEST Normal respiratory effort. No intercostal retractions  ?LUNGS: Clear to auscultation bilaterally.  No stridor. No wheezes. No rales.  ?CARDIOVASCULAR: Regular rate and rhythm, positive W2-H8, soft holosystolic murmur heard at the apex, no rubs or gallops appreciated. ?ABDOMINAL:  soft, nontender, nondistended, positive bowel sounds all 4 quadrants. No apparent ascites.  ?EXTREMITIES: 1+ edema left lower leg to knee, minimal right lower leg edema, warm to touch, compression stockings present ?HEMATOLOGIC: No significant bruising ?NEUROLOGIC: Oriented to person, place, and time. Nonfocal. Normal muscle tone.  ?PSYCHIATRIC: Normal mood and affect. Normal behavior. Cooperative ? ?CARDIAC DATABASE: ?EKG: ?04/03/2021: NSR, 66 bpm, first-degree AV block, left axis, possible old anteroseptal infarct, nonspecific T wave  changes. ? ?Echocardiogram: ?04/10/2021:  ?Left ventricle cavity is normal in size. Severe concentric hypertrophy of the left ventricle. Speckled pattern suggests infiltrative cardiomyopathy.  ?Hypokinetic global wall motion. Doppler evidence of grade I (impaired) diastolic dysfunction, normal LAP. Severely depressed LV systolic function with visual EF 25-30%. Calculated EF 30%.  ?Left atrial cavity is severely dilated at 5.2 cm. Right atrial cavity is mildly dilated.  ?Right ventricle cavity is normal in size. Mild concentric hypertrophy of the right ventricle. Severely reduced right ventricular function.  ?Trileaflet aortic valve.  Trace aortic regurgitation.  ?Structurally normal mitral valve.  Moderate (Grade II) mitral regurgitation.  ?Structurally normal tricuspid valve.  Moderate tricuspid regurgitation.  ?Severe pulmonary hypertension. RVSP measures 72 mmHg.  ?Pericardium is normal. Insignificant pericardial effusion.  ?IVC is dilated with poor inspiration collapse consistent with elevated right atrial pressure. ?  ?Stress Testing: ?Exercise/Lexiscan Tetrofosmin stress test 05/06/2021: ?Exercise nuclear stress test was performed using Bruce protocol. Patient reached 5.6 METS, and 71% of age predicted maximum heart rate. Because of sub-maximal HR the patient was injected with 0.4 mg of Intravenous Lexiscan over 15 sec. Exercise capacity was low. No chest pain reported. Dyspnea and dizziness reported. Blunted heart rate response. Unable to  comment on post exercise blood pressure, as reported blood pressure after stress is only after Lexiscan injection. Stress EKG at 71% MPHR showed sinus rhythm, LAFB, occasional PVC, no ischemic changes.  ?Dilated LV cavity with moderate global decrease in myocardial wall motion and thickening. Stress LVEF 41%. SPECT images show small area of mild intensity, fixed perfusion defect in basal inferior myocardium. No definite evidence of ischemia. ?High risk study in the setting of  dilated cardiomyopathy.  ? ?Heart Catheterization: ?None ? ?CT Cardiac Scoring ?04/13/2021 ?1. Coronary calcium score is 1130 and this is at percentile 93 for patients of the same age, gender and ethnicity. ? ?2. Nonspecific patchy densit

## 2021-06-01 ENCOUNTER — Other Ambulatory Visit: Payer: Self-pay | Admitting: Cardiology

## 2021-06-01 DIAGNOSIS — I5041 Acute combined systolic (congestive) and diastolic (congestive) heart failure: Secondary | ICD-10-CM

## 2021-06-02 ENCOUNTER — Other Ambulatory Visit (HOSPITAL_COMMUNITY): Payer: Self-pay

## 2021-06-09 ENCOUNTER — Other Ambulatory Visit (HOSPITAL_COMMUNITY): Payer: Self-pay

## 2021-06-09 ENCOUNTER — Other Ambulatory Visit: Payer: Self-pay | Admitting: Student

## 2021-06-09 DIAGNOSIS — I429 Cardiomyopathy, unspecified: Secondary | ICD-10-CM

## 2021-06-09 DIAGNOSIS — I5041 Acute combined systolic (congestive) and diastolic (congestive) heart failure: Secondary | ICD-10-CM

## 2021-06-09 MED ORDER — TORSEMIDE 10 MG PO TABS
10.0000 mg | ORAL_TABLET | Freq: Every day | ORAL | 3 refills | Status: DC | PRN
  Filled 2021-06-09: qty 30, 30d supply, fill #0
  Filled 2021-07-16: qty 30, 30d supply, fill #1

## 2021-06-16 ENCOUNTER — Other Ambulatory Visit (HOSPITAL_COMMUNITY): Payer: Self-pay

## 2021-06-16 MED ORDER — ALLOPURINOL 100 MG PO TABS
100.0000 mg | ORAL_TABLET | Freq: Every day | ORAL | 2 refills | Status: DC
Start: 2021-06-16 — End: 2023-01-26
  Filled 2021-06-16: qty 90, 90d supply, fill #0
  Filled 2021-10-08: qty 90, 90d supply, fill #1
  Filled 2021-12-14 – 2021-12-29 (×2): qty 90, 90d supply, fill #2

## 2021-06-16 MED ORDER — DOXAZOSIN MESYLATE 8 MG PO TABS
ORAL_TABLET | ORAL | 2 refills | Status: DC
Start: 2021-06-16 — End: 2023-12-05
  Filled 2021-06-16: qty 135, 90d supply, fill #0
  Filled 2021-09-16: qty 135, 90d supply, fill #1
  Filled 2021-12-14: qty 135, 90d supply, fill #2

## 2021-06-30 LAB — HEPATIC FUNCTION PANEL
ALT: 26 IU/L (ref 0–44)
AST: 31 IU/L (ref 0–40)
Albumin: 3.9 g/dL (ref 3.7–4.7)
Alkaline Phosphatase: 102 IU/L (ref 44–121)
Bilirubin Total: 2 mg/dL — ABNORMAL HIGH (ref 0.0–1.2)
Bilirubin, Direct: 0.52 mg/dL — ABNORMAL HIGH (ref 0.00–0.40)
Total Protein: 6.7 g/dL (ref 6.0–8.5)

## 2021-06-30 LAB — LIPID PANEL WITH LDL/HDL RATIO
Cholesterol, Total: 128 mg/dL (ref 100–199)
HDL: 49 mg/dL (ref >39)
LDL Chol Calc (NIH): 66 mg/dL (ref 0–99)
LDL/HDL Ratio: 1.3 ratio (ref 0.0–3.6)
Triglycerides: 61 mg/dL (ref 0–149)
VLDL Cholesterol Cal: 13 mg/dL (ref 5–40)

## 2021-07-02 ENCOUNTER — Ambulatory Visit: Payer: Medicare Other | Admitting: Cardiology

## 2021-07-02 ENCOUNTER — Encounter: Payer: Self-pay | Admitting: Cardiology

## 2021-07-02 ENCOUNTER — Other Ambulatory Visit (HOSPITAL_COMMUNITY): Payer: Self-pay

## 2021-07-02 VITALS — BP 149/85 | HR 68 | Temp 98.4°F | Resp 16 | Ht 74.0 in | Wt 222.2 lb

## 2021-07-02 DIAGNOSIS — E7800 Pure hypercholesterolemia, unspecified: Secondary | ICD-10-CM

## 2021-07-02 DIAGNOSIS — R911 Solitary pulmonary nodule: Secondary | ICD-10-CM

## 2021-07-02 DIAGNOSIS — E7841 Elevated Lipoprotein(a): Secondary | ICD-10-CM

## 2021-07-02 DIAGNOSIS — I5042 Chronic combined systolic (congestive) and diastolic (congestive) heart failure: Secondary | ICD-10-CM

## 2021-07-02 DIAGNOSIS — C61 Malignant neoplasm of prostate: Secondary | ICD-10-CM

## 2021-07-02 DIAGNOSIS — I429 Cardiomyopathy, unspecified: Secondary | ICD-10-CM

## 2021-07-02 DIAGNOSIS — I2510 Atherosclerotic heart disease of native coronary artery without angina pectoris: Secondary | ICD-10-CM

## 2021-07-02 DIAGNOSIS — R7303 Prediabetes: Secondary | ICD-10-CM

## 2021-07-02 DIAGNOSIS — G4731 Primary central sleep apnea: Secondary | ICD-10-CM

## 2021-07-02 MED ORDER — ISOSORB DINITRATE-HYDRALAZINE 20-37.5 MG PO TABS
1.0000 | ORAL_TABLET | Freq: Three times a day (TID) | ORAL | 0 refills | Status: AC
  Filled 2021-07-02: qty 90, 30d supply, fill #0

## 2021-07-02 NOTE — Progress Notes (Signed)
ID:  Joel Duarte, DOB Feb 11, 1945, MRN [id number]  PCP:  Rogers Blocker, MD  Cardiologist:  Rex Kras, DO, [facility] (established care April 03, 2021)  Date: 07/02/21 Last Office Visit: 05/29/2021  Chief Complaint  Patient presents with   Follow-up   heart failure management     HPI  Joel Duarte is a 76 y.o. African-American male whose past medical history and cardiovascular risk factors include: Cardiomyopathy, chronic combined systolic and diastolic heart failure, severe coronary artery calcification (total CAC 1130/93rd percentile), elevated LP(a), hypertension with chronic kidney disease stage IIIb, erectile dysfunction, gout, history of seizures, central sleep apnea, prediabetes, prostate cancer, pulmonary nodules, advanced age.  Patient is being followed by the practice for management of systolic and diastolic heart failure and severe coronary artery calcification.  Patient's nuclear stress test was reported to be high risk but shared decision was to treat him medically given his renal function and the risk for contrast-induced nephropathy leading to worsening AKI/CKD requiring hemodialysis.  Over the last several office visits we have uptitrated GDMT for at least attempted.  Patient was on Entresto but the medication was discontinued due to worsening renal function.  At the last office visit he was started on spironolactone but patient states that he stopped taking it as he felt lightheaded dizzy and worn out.  Other medications reconciled.  Patient's weight remains relatively stable.  Home blood pressures are still greater than 140 mmHg.  Denies angina pectoris or heart failure symptoms.  He continues to use torsemide on as needed basis to maintain euvolemia.  FUNCTIONAL STATUS: No structured exercise program or daily routine.   ALLERGIES: No Known Allergies  MEDICATION LIST PRIOR TO VISIT: Current Meds  Medication Sig   allopurinol (ZYLOPRIM) 100 MG tablet Take 100 mg by mouth  daily.   allopurinol (ZYLOPRIM) 100 MG tablet Take 1 tablet (100 mg total) by mouth daily.   aspirin EC 81 MG tablet Take 1 tablet (81 mg total) by mouth daily. Swallow whole.   atorvastatin (LIPITOR) 40 MG tablet Take 1 tablet (40 mg total) by mouth at bedtime.   azelastine (ASTELIN) 0.1 % nasal spray USE ONE PUFF IN EACH NOSTRIL TWICE A DAY AS NEEDED   carvedilol (COREG) 25 MG tablet Take 1 tablet (25 mg total) by mouth 2 (two) times daily.   dapagliflozin propanediol (FARXIGA) 10 MG TABS tablet Take 1 tablet (10 mg total) by mouth daily before breakfast.   doxazosin (CARDURA) 8 MG tablet TAKE 1 TABLET BY MOUTH IN MORNING AND 1/2 TABLET IN THE EVENING   doxazosin (CARDURA) 8 MG tablet Take 1 tablet (8 mg total) by mouth in the morning AND 0.5 tablets (4 mg total) at bedtime.   dutasteride (AVODART) 0.5 MG capsule Take 1 capsule (0.5 mg total) by mouth daily.   fluticasone (FLONASE) 50 MCG/ACT nasal spray USE 1 SPRAY INTO EACH NOSTRIL TWO TIMES DAILY AS NEEDED   isosorbide-hydrALAZINE (BIDIL) 20-37.5 MG tablet Take 1 tablet by mouth 3 (three) times daily.   levETIRAcetam (KEPPRA) 750 MG tablet Take 1 tablet (750 mg total) by mouth 2 (two) times daily.   meclizine (ANTIVERT) 12.5 MG tablet Take 1-2 tablets (12.5-25 mg total) by mouth 2 (two) times daily as needed for dizziness   torsemide (DEMADEX) 10 MG tablet Take 1 tablet (10 mg total) by mouth daily as needed (For shortness of breath, weight gain of > 3lbs in a day or 5 lbs in week, leg swelling).   [DISCONTINUED] spironolactone (ALDACTONE) 25  MG tablet Take 0.5 tablets (12.5 mg total) by mouth daily.     PAST MEDICAL HISTORY: Past Medical History:  Diagnosis Date   Allergy    Enlarged prostate    Gout    Hypertension    Osteoarthrosis, unspecified whether generalized or localized, unspecified site    Seizures ([place])    history of 1 a year ago    PAST SURGICAL HISTORY: Past Surgical History:  Procedure Laterality Date   no  surgical h/o  June 2014    FAMILY HISTORY: The patient family history includes Healthy in his child and sister; Heart Problems in his mother; Hypertension in his mother.  SOCIAL HISTORY:  The patient  reports that he has never smoked. He has never used smokeless tobacco. He reports that he does not currently use alcohol. He reports that he does not use drugs.  REVIEW OF SYSTEMS: Review of Systems  Constitutional: Positive for malaise/fatigue (improved).  Cardiovascular:  Positive for leg swelling, orthopnea and paroxysmal nocturnal dyspnea. Negative for chest pain, dyspnea on exertion, palpitations and syncope.  Respiratory:  Negative for shortness of breath.     PHYSICAL EXAM:    07/02/2021    3:08 PM 05/29/2021    1:24 PM 05/11/2021    2:21 PM  Vitals with BMI  Height $Remov'6\' 2"'TBqLVN$  $Remove'6\' 2"'mQmWrLq$    Weight 222 lbs 3 oz 222 lbs   BMI 81.19 14.78   Systolic 295 621 308  Diastolic 85 80 657  Pulse 68 73 67    CONSTITUTIONAL: Well-developed and well-nourished. No acute distress.  SKIN: Skin is warm and dry. No rash noted. No cyanosis. No pallor. No jaundice HEAD: Normocephalic and atraumatic.  EYES: No scleral icterus MOUTH/THROAT: Moist oral membranes.  NECK: No JVD present. No thyromegaly noted. No carotid bruits  CHEST Normal respiratory effort. No intercostal retractions  LUNGS: Clear to auscultation bilaterally.  No stridor. No wheezes. No rales.  CARDIOVASCULAR: Regular rate and rhythm, positive Q4-O9, soft holosystolic murmur heard at the apex, no rubs or gallops appreciated. ABDOMINAL:  soft, nontender, nondistended, positive bowel sounds all 4 quadrants. No apparent ascites.  EXTREMITIES: Trace bilateral peripheral edema, warm to touch, compression stockings present HEMATOLOGIC: No significant bruising NEUROLOGIC: Oriented to person, place, and time. Nonfocal. Normal muscle tone.  PSYCHIATRIC: Normal mood and affect. Normal behavior. Cooperative  CARDIAC DATABASE: EKG: 04/03/2021:  NSR, 66 bpm, first-degree AV block, left axis, possible old anteroseptal infarct, nonspecific T wave changes.  Echocardiogram: 04/10/2021:  Left ventricle cavity is normal in size. Severe concentric hypertrophy of the left ventricle. Speckled pattern suggests infiltrative cardiomyopathy.  Hypokinetic global wall motion. Doppler evidence of grade I (impaired) diastolic dysfunction, normal LAP. Severely depressed LV systolic function with visual EF 25-30%. Calculated EF 30%.  Left atrial cavity is severely dilated at 5.2 cm. Right atrial cavity is mildly dilated.  Right ventricle cavity is normal in size. Mild concentric hypertrophy of the right ventricle. Severely reduced right ventricular function.  Trileaflet aortic valve.  Trace aortic regurgitation.  Structurally normal mitral valve.  Moderate (Grade II) mitral regurgitation.  Structurally normal tricuspid valve.  Moderate tricuspid regurgitation.  Severe pulmonary hypertension. RVSP measures 72 mmHg.  Pericardium is normal. Insignificant pericardial effusion.  IVC is dilated with poor inspiration collapse consistent with elevated right atrial pressure.   Stress Testing: Exercise/Lexiscan Tetrofosmin stress test 05/06/2021: Exercise nuclear stress test was performed using Bruce protocol. Patient reached 5.6 METS, and 71% of age predicted maximum heart rate. Because of sub-maximal HR the patient  was injected with 0.4 mg of Intravenous Lexiscan over 15 sec. Exercise capacity was low. No chest pain reported. Dyspnea and dizziness reported. Blunted heart rate response. Unable to comment on post exercise blood pressure, as reported blood pressure after stress is only after Lexiscan injection. Stress EKG at 71% MPHR showed sinus rhythm, LAFB, occasional PVC, no ischemic changes.  Dilated LV cavity with moderate global decrease in myocardial wall motion and thickening. Stress LVEF 41%. SPECT images show small area of mild intensity, fixed perfusion  defect in basal inferior myocardium. No definite evidence of ischemia. High risk study in the setting of dilated cardiomyopathy.   Heart Catheterization: None  CT Cardiac Scoring 04/13/2021 1. Coronary calcium score is 1130 and this is at percentile 93 for patients of the same age, gender and ethnicity.  2. Nonspecific patchy densities at the lung bases which could represent combination of chronic changes and/or atelectasis. Inaddition, there are small indeterminate nodule, largest measures 5 mm. No follow-up needed if patient is low-risk (and has no known or suspected primary neoplasm). Non-contrast chest CT can be considered in 12 months if patient is high-risk. This recommendation follows the consensus statement: Guidelines for Management of Incidental Pulmonary Nodules Detected on CT Images: From the Fleischner Society 2017; Radiology 2017; 284:228-243.  3. Sclerotic focus involving a thoracic vertebral body. This has benign characteristics but recommend correlation with PSA level based on history of prostate cancer.  4. Mild cardiomegaly.  LABORATORY DATA: External Labs: Collected: December 01, 2020 Sodium 144, potassium 4.9, chloride 108, bicarb 26. AST 17, ALT 12, alkaline phosphatase 82. Hemoglobin 12 g/dL, hematocrit 38.3%. BNP 362. NT proBNP 2741. Hemoglobin A1c 6  Collected: February 19, 2021. BUN 36, creatinine 2.19 mg/dL. eGFR 31 Sodium 144, potassium 4.5, chloride 109, bicarbonate 25. AST 16, ALT 13, alkaline phosphatase 81. PSA 15. NT proBNP 3304. Uric acid 7.7     Latest Ref Rng & Units 03/27/2013   10:12 AM 08/06/2011    4:57 AM 06/08/2011    5:32 AM  CBC  WBC 4.0 - 10.5 K/uL  10.6  7.7   Hemoglobin 13.0 - 17.0 g/dL 13.9  12.5  10.8   Hematocrit 39.0 - 52.0 % 41.0  37.7  32.9   Platelets 150 - 400 K/uL  158  151        Latest Ref Rng & Units 06/29/2021    9:25 AM 05/20/2021    9:12 AM 04/27/2021    9:00 AM  CMP  Glucose 70 - 99 mg/dL  104  118   BUN 8 -  27 mg/dL  28  23   Creatinine 0.76 - 1.27 mg/dL  2.03  2.01   Sodium 134 - 144 mmol/L  146  144   Potassium 3.5 - 5.2 mmol/L  3.9  4.1   Chloride 96 - 106 mmol/L  109  110   CO2 20 - 29 mmol/L  21  16   Calcium 8.6 - 10.2 mg/dL  8.9  9.0   Total Protein 6.0 - 8.5 g/dL 6.7     Total Bilirubin 0.0 - 1.2 mg/dL 2.0     Alkaline Phos 44 - 121 IU/L 102     AST 0 - 40 IU/L 31     ALT 0 - 44 IU/L 26       Lipid Panel     Component Value Date/Time   CHOL 128 06/29/2021 0925   TRIG 61 06/29/2021 0925   HDL 49 06/29/2021 0925   LDLCALC  66 06/29/2021 0925   LDLDIRECT 117 (H) 04/08/2021 0818   LABVLDL 13 06/29/2021 0925    No components found for: "NTPROBNP" Recent Labs    04/08/21 0818 04/27/21 0900 05/20/21 0912  PROBNP 4,300* 4,660* 8,074*   No results for input(s): "TSH" in the last 8760 hours.  BMP Recent Labs    04/08/21 0818 04/27/21 0900 05/20/21 0912  NA 142 144 146*  K 3.8 4.1 3.9  CL 106 110* 109*  CO2 21 16* 21  GLUCOSE 108* 118* 104*  BUN 26 23 28*  CREATININE 2.09* 2.01* 2.03*  CALCIUM 9.0 9.0 8.9    HEMOGLOBIN A1C Lab Results  Component Value Date   HGBA1C 6.0 11/30/2011   MPG 134 (H) 06/07/2011    Cardiac Panel (last 3 results) No results for input(s): "CKTOTAL", "CKMB", "TROPONINIHS", "RELINDX" in the last 72 hours.  CHOLESTEROL Recent Labs    04/08/21 0818 06/29/21 0925  CHOL 178 128    Hepatic Function Panel Recent Labs    04/08/21 0818 06/29/21 0925  PROT 7.3 6.7  ALBUMIN 4.2 3.9  AST 27 31  ALT 19 26  ALKPHOS 104 102  BILITOT 1.8* 2.0*  BILIDIR  --  0.52*   IMPRESSION:    ICD-10-CM   1. Chronic combined systolic and diastolic congestive heart failure, NYHA class 2 (HCC)  I50.42 isosorbide-hydrALAZINE (BIDIL) 20-37.5 MG tablet    Basic metabolic panel    Magnesium    Pro b natriuretic peptide (BNP)    2. Cardiomyopathy, unspecified type (HCC)  I42.9 isosorbide-hydrALAZINE (BIDIL) 20-37.5 MG tablet    3. Coronary  atherosclerosis due to calcified coronary lesion  I25.10    I25.84     4. Hypercholesteremia  E78.00     5. Elevated Lp(a)  E78.41     6. Prediabetes  R73.03     7. Central sleep apnea  G47.31     8. Prostate cancer ([place])  C61     9. Pulmonary nodule  R91.1        RECOMMENDATIONS: Mehtaab Mayeda is a 76 y.o. African-American male whose past medical history and cardiac risk factors include: Cardiomyopathy, chronic combined systolic and diastolic heart failure, severe coronary artery calcification (total CAC 1130/93rd percentile), elevated LP(a), hypertension with chronic kidney disease stage IIIb, erectile dysfunction, gout, history of seizures, central sleep apnea, prediabetes, prostate cancer, pulmonary nodules, advanced age.  Chronic combined systolic and diastolic congestive heart failure, NYHA class 2 (HCC)/cardiomyopathy Stage B, NYHA class II/III Weight remains relatively stable since last visit. Appears to be euvolemic on physical examination. Medications reconciled. Entresto discontinued due to worsening renal function. Spironolactone DC'd secondary to him feeling tired, fatigued, worn out.  Same effects at 25 mg/day and 12.5 mg p.o. daily Continue carvedilol, Farxiga, torsemide We will start BiDil.  Patient is made aware of the medication profile and not to take erectile dysfunction medications as there are drug to drug interactions.  He verbalizes understanding. Labs from May and June 2023 independently reviewed  Coronary atherosclerosis due to calcified coronary lesion Continue aspirin and statin therapy Total CAC 1130, 93rd percentile Most recent lipid profile reviewed.  Hypercholesteremia /elevated LP(a) Currently on atorvastatin.   Initial LDL levels 117 mg/dL and LP(a) 104.9 He denies myalgia or other side effects. Most recent lipids dated June 2023, independently reviewed as noted above.  LDL-C 66 mg/dL  Prediabetes Management per primary team. We will add  ARB or retrial Entresto at the next visit if renal function allows  Central sleep  apnea Reemphasized importance of CPAP compliance  Prostate cancer [facility]) Has an appointment with to see his urologist in the next couple months.  I have asked him to discuss the pulmonary nodules as well to see if they are associated or to independent findings.  Pulmonary nodule We will defer management to primary team.  FINAL MEDICATION LIST END OF ENCOUNTER: Meds ordered this encounter  Medications   isosorbide-hydrALAZINE (BIDIL) 20-37.5 MG tablet    Sig: Take 1 tablet by mouth 3 (three) times daily.    Dispense:  270 tablet    Refill:  0     Current Outpatient Medications:    allopurinol (ZYLOPRIM) 100 MG tablet, Take 100 mg by mouth daily., Disp: , Rfl:    allopurinol (ZYLOPRIM) 100 MG tablet, Take 1 tablet (100 mg total) by mouth daily., Disp: 90 tablet, Rfl: 2   aspirin EC 81 MG tablet, Take 1 tablet (81 mg total) by mouth daily. Swallow whole., Disp: 30 tablet, Rfl: 11   atorvastatin (LIPITOR) 40 MG tablet, Take 1 tablet (40 mg total) by mouth at bedtime., Disp: 90 tablet, Rfl: 0   azelastine (ASTELIN) 0.1 % nasal spray, USE ONE PUFF IN EACH NOSTRIL TWICE A DAY AS NEEDED, Disp: 30 mL, Rfl: 6   carvedilol (COREG) 25 MG tablet, Take 1 tablet (25 mg total) by mouth 2 (two) times daily., Disp: 180 tablet, Rfl: 2   dapagliflozin propanediol (FARXIGA) 10 MG TABS tablet, Take 1 tablet (10 mg total) by mouth daily before breakfast., Disp: 90 tablet, Rfl: 0   doxazosin (CARDURA) 8 MG tablet, TAKE 1 TABLET BY MOUTH IN MORNING AND 1/2 TABLET IN THE EVENING, Disp: 135 tablet, Rfl: 2   doxazosin (CARDURA) 8 MG tablet, Take 1 tablet (8 mg total) by mouth in the morning AND 0.5 tablets (4 mg total) at bedtime., Disp: 135 tablet, Rfl: 2   dutasteride (AVODART) 0.5 MG capsule, Take 1 capsule (0.5 mg total) by mouth daily., Disp: 90 capsule, Rfl: 3   fluticasone (FLONASE) 50 MCG/ACT nasal spray, USE 1 SPRAY INTO  EACH NOSTRIL TWO TIMES DAILY AS NEEDED, Disp: 16 g, Rfl: 5   isosorbide-hydrALAZINE (BIDIL) 20-37.5 MG tablet, Take 1 tablet by mouth 3 (three) times daily., Disp: 270 tablet, Rfl: 0   levETIRAcetam (KEPPRA) 750 MG tablet, Take 1 tablet (750 mg total) by mouth 2 (two) times daily., Disp: 180 tablet, Rfl: 3   meclizine (ANTIVERT) 12.5 MG tablet, Take 1-2 tablets (12.5-25 mg total) by mouth 2 (two) times daily as needed for dizziness, Disp: 50 tablet, Rfl: 0   torsemide (DEMADEX) 10 MG tablet, Take 1 tablet (10 mg total) by mouth daily as needed (For shortness of breath, weight gain of > 3lbs in a day or 5 lbs in week, leg swelling)., Disp: 30 tablet, Rfl: 3  Orders Placed This Encounter  Procedures   Basic metabolic panel   Magnesium   Pro b natriuretic peptide (BNP)    There are no Patient Instructions on file for this visit.   --Continue cardiac medications as reconciled in final medication list. --Return in about 6 weeks (around 08/13/2021) for Follow up, heart failure management.. Or sooner if needed. --Continue follow-up with your primary care physician regarding the management of your other chronic comorbid conditions.  Patient's questions and concerns were addressed to his satisfaction. He voices understanding of the instructions provided during this encounter.   This note was created using a voice recognition software as a result there may be grammatical errors  inadvertently enclosed that do not reflect the nature of this encounter. Every attempt is made to correct such errors.  Rex Kras, [place], [facility]  Pager: [phone] Office: [phone]

## 2021-07-06 ENCOUNTER — Other Ambulatory Visit (HOSPITAL_COMMUNITY): Payer: Self-pay

## 2021-07-16 ENCOUNTER — Other Ambulatory Visit: Payer: Self-pay | Admitting: Cardiology

## 2021-07-16 ENCOUNTER — Other Ambulatory Visit (HOSPITAL_COMMUNITY): Payer: Self-pay

## 2021-07-16 DIAGNOSIS — E7800 Pure hypercholesterolemia, unspecified: Secondary | ICD-10-CM

## 2021-07-16 MED ORDER — ATORVASTATIN CALCIUM 40 MG PO TABS
40.0000 mg | ORAL_TABLET | Freq: Every day | ORAL | 0 refills | Status: DC
  Filled 2021-07-16: qty 90, 90d supply, fill #0

## 2021-08-06 ENCOUNTER — Other Ambulatory Visit: Payer: Self-pay | Admitting: Cardiology

## 2021-08-06 ENCOUNTER — Other Ambulatory Visit (HOSPITAL_COMMUNITY): Payer: Self-pay

## 2021-08-06 DIAGNOSIS — I5041 Acute combined systolic (congestive) and diastolic (congestive) heart failure: Secondary | ICD-10-CM

## 2021-08-06 DIAGNOSIS — I429 Cardiomyopathy, unspecified: Secondary | ICD-10-CM

## 2021-08-06 MED ORDER — AZITHROMYCIN 250 MG PO TABS
ORAL_TABLET | ORAL | 0 refills | Status: DC
  Filled 2021-08-06: qty 6, 5d supply, fill #0

## 2021-08-06 MED ORDER — DAPAGLIFLOZIN PROPANEDIOL 10 MG PO TABS
10.0000 mg | ORAL_TABLET | Freq: Every day | ORAL | 0 refills | Status: AC
  Filled 2021-08-06: qty 30, 30d supply, fill #0
  Filled 2021-09-02: qty 30, 30d supply, fill #1
  Filled 2021-10-08: qty 30, 30d supply, fill #2

## 2021-08-06 MED ORDER — OMEPRAZOLE 40 MG PO CPDR
40.0000 mg | DELAYED_RELEASE_CAPSULE | Freq: Every day | ORAL | 3 refills | Status: DC
Start: 2021-08-06 — End: 2021-10-07
  Filled 2021-08-06: qty 30, 30d supply, fill #0

## 2021-08-12 LAB — BASIC METABOLIC PANEL
BUN/Creatinine Ratio: 16 (ref 10–24)
BUN: 38 mg/dL — ABNORMAL HIGH (ref 8–27)
CO2: 17 mmol/L — ABNORMAL LOW (ref 20–29)
Calcium: 9 mg/dL (ref 8.6–10.2)
Chloride: 109 mmol/L — ABNORMAL HIGH (ref 96–106)
Creatinine, Ser: 2.39 mg/dL — ABNORMAL HIGH (ref 0.76–1.27)
Glucose: 113 mg/dL — ABNORMAL HIGH (ref 70–99)
Potassium: 3.9 mmol/L (ref 3.5–5.2)
Sodium: 142 mmol/L (ref 134–144)
eGFR: 28 mL/min/{1.73_m2} — ABNORMAL LOW (ref >59)

## 2021-08-12 LAB — PRO B NATRIURETIC PEPTIDE: NT-Pro BNP: 8349 pg/mL — ABNORMAL HIGH (ref 0–486)

## 2021-08-12 LAB — MAGNESIUM: Magnesium: 2.1 mg/dL (ref 1.6–2.3)

## 2021-08-13 ENCOUNTER — Ambulatory Visit: Payer: Medicare Other | Admitting: Cardiology

## 2021-08-13 ENCOUNTER — Other Ambulatory Visit (HOSPITAL_COMMUNITY): Payer: Self-pay

## 2021-08-13 ENCOUNTER — Encounter: Payer: Self-pay | Admitting: Cardiology

## 2021-08-13 VITALS — BP 135/75 | HR 66 | Temp 97.3°F | Resp 16 | Ht 74.0 in | Wt 230.2 lb

## 2021-08-13 DIAGNOSIS — I5041 Acute combined systolic (congestive) and diastolic (congestive) heart failure: Secondary | ICD-10-CM

## 2021-08-13 DIAGNOSIS — I2510 Atherosclerotic heart disease of native coronary artery without angina pectoris: Secondary | ICD-10-CM

## 2021-08-13 DIAGNOSIS — I429 Cardiomyopathy, unspecified: Secondary | ICD-10-CM

## 2021-08-13 DIAGNOSIS — E7800 Pure hypercholesterolemia, unspecified: Secondary | ICD-10-CM

## 2021-08-13 DIAGNOSIS — C61 Malignant neoplasm of prostate: Secondary | ICD-10-CM

## 2021-08-13 DIAGNOSIS — R911 Solitary pulmonary nodule: Secondary | ICD-10-CM

## 2021-08-13 MED ORDER — TORSEMIDE 10 MG PO TABS
10.0000 mg | ORAL_TABLET | Freq: Every morning | ORAL | 0 refills | Status: DC
  Filled 2021-08-13: qty 90, 90d supply, fill #0

## 2021-08-13 MED ORDER — METOLAZONE 2.5 MG PO TABS
2.5000 mg | ORAL_TABLET | ORAL | 0 refills | Status: DC
  Filled 2021-08-13: qty 30, 70d supply, fill #0

## 2021-08-13 NOTE — Progress Notes (Signed)
ID:  Minard Millirons, DOB 08-Jul-1945, MRN [id number]  PCP:  Rogers Blocker, MD  Cardiologist:  Rex Kras, DO, [facility] (established care April 03, 2021)  Date: 08/13/21 Last Office Visit: 07/02/2021  Chief Complaint  Patient presents with   heart failure management   Follow-up    HPI  Darrick Greenlaw is a 76 y.o. African-American male whose past medical history and cardiovascular risk factors include: Cardiomyopathy, chronic combined systolic and diastolic heart failure, severe coronary artery calcification (total CAC 1130/93rd percentile), elevated LP(a), hypertension with chronic kidney disease stage 3b/4, erectile dysfunction, gout, history of seizures, central sleep apnea, prediabetes, prostate cancer, pulmonary nodules, advanced age.  Since establishing care patient has undergone ischemic work-up given his severe coronary artery calcification.  Nuclear stress test performed in April 2023 was reported to be high risk given the reduced LVEF, dilated LV cavity, wall motion abnormality, no definite evidence of ischemia.  Echocardiogram noted severely reduced LVEF and grade 1 diastolic impairment.  Since then patient has been diuresed and GDMT has been uptitrated.  In the past Entresto was discontinued due to worsening renal function and spironolactone was also stopped due to him feeling tired/dizzy/lightheaded.  At last office visit patient was asked to take torsemide on as-needed basis if the weight goes up by 1 pound over 24 hours or 3 pounds over a week.  He was started on BiDil.  However, since last office visit he has gained at least 8 pounds as per the office scale and he has swelling up to the lower abdomen.  He denies orthopnea or PND.  Recent labs from August 11, 2021 independently reviewed.  FUNCTIONAL STATUS: No structured exercise program or daily routine.   ALLERGIES: No Known Allergies  MEDICATION LIST PRIOR TO VISIT: Current Meds  Medication Sig   allopurinol (ZYLOPRIM) 100 MG  tablet Take 100 mg by mouth daily.   allopurinol (ZYLOPRIM) 100 MG tablet Take 1 tablet (100 mg total) by mouth daily.   aspirin EC 81 MG tablet Take 1 tablet (81 mg total) by mouth daily. Swallow whole.   atorvastatin (LIPITOR) 40 MG tablet Take 1 tablet (40 mg total) by mouth at bedtime.   azithromycin (ZITHROMAX) 250 MG tablet take 2 tablets on day 1, then one tablet daily   carvedilol (COREG) 25 MG tablet Take 1 tablet (25 mg total) by mouth 2 (two) times daily.   dapagliflozin propanediol (FARXIGA) 10 MG TABS tablet Take 1 tablet (10 mg total) by mouth daily before breakfast.   doxazosin (CARDURA) 8 MG tablet Take 1 tablet (8 mg total) by mouth in the morning AND 0.5 tablets (4 mg total) at bedtime.   dutasteride (AVODART) 0.5 MG capsule Take 1 capsule (0.5 mg total) by mouth daily.   isosorbide-hydrALAZINE (BIDIL) 20-37.5 MG tablet Take 1 tablet by mouth 3 (three) times daily. (Patient taking differently: Take 1 tablet by mouth 2 (two) times daily.)   levETIRAcetam (KEPPRA) 750 MG tablet Take 1 tablet (750 mg total) by mouth 2 (two) times daily.   [START ON 08/14/2021] metolazone (ZAROXOLYN) 2.5 MG tablet Take 1 tablet (2.5 mg total) by mouth every Monday, Wednesday, and Friday for 30 doses.   omeprazole (PRILOSEC) 40 MG capsule Take 1 capsule (40 mg total) by mouth daily.   [DISCONTINUED] doxazosin (CARDURA) 8 MG tablet TAKE 1 TABLET BY MOUTH IN MORNING AND 1/2 TABLET IN THE EVENING   [DISCONTINUED] torsemide (DEMADEX) 10 MG tablet Take 1 tablet (10 mg total) by mouth daily as needed (  For shortness of breath, weight gain of > 3lbs in a day or 5 lbs in week, leg swelling).     PAST MEDICAL HISTORY: Past Medical History:  Diagnosis Date   Allergy    Enlarged prostate    Gout    Hyperlipidemia    Hypertension    Osteoarthrosis, unspecified whether generalized or localized, unspecified site    Seizures ([place])    history of 1 a year ago    PAST SURGICAL HISTORY: Past Surgical History:   Procedure Laterality Date   no surgical h/o  June 2014    FAMILY HISTORY: The patient family history includes Healthy in his child and sister; Heart Problems in his mother; Hypertension in his mother.  SOCIAL HISTORY:  The patient  reports that he has never smoked. He has never used smokeless tobacco. He reports that he does not currently use alcohol. He reports that he does not use drugs.  REVIEW OF SYSTEMS: Review of Systems  Constitutional: Positive for malaise/fatigue (improved) and weight gain.  Cardiovascular:  Positive for dyspnea on exertion and leg swelling. Negative for chest pain, orthopnea, palpitations, paroxysmal nocturnal dyspnea and syncope.  Respiratory:  Negative for shortness of breath.     PHYSICAL EXAM:    08/13/2021    2:10 PM 07/02/2021    3:08 PM 05/29/2021    1:24 PM  Vitals with BMI  Height 6' 2"  6' 2"  6' 2"   Weight 230 lbs 3 oz 222 lbs 3 oz 222 lbs  BMI 29.54 62.13 08.65  Systolic 784 696 295  Diastolic 75 85 80  Pulse 66 68 73    CONSTITUTIONAL: Well-developed and well-nourished. No acute distress.  SKIN: Skin is warm and dry. No rash noted. No cyanosis. No pallor. No jaundice HEAD: Normocephalic and atraumatic.  EYES: No scleral icterus MOUTH/THROAT: Moist oral membranes.  NECK: JVD present. No thyromegaly noted. No carotid bruits  CHEST Normal respiratory effort. No intercostal retractions  LUNGS: Clear to auscultation bilaterally.  No stridor. No wheezes. No rales.  CARDIOVASCULAR: Regular rate and rhythm, positive M8-U1, soft holosystolic murmur heard at the apex, no rubs or gallops appreciated. ABDOMINAL: Pitting edema, nontender, nondistended, positive bowel sounds all 4 quadrants. No apparent ascites.  EXTREMITIES: +2 bilateral peripheral edema extending to the lateral thighs and the abdominal wall, warm to touch, compression stockings present HEMATOLOGIC: No significant bruising NEUROLOGIC: Oriented to person, place, and time. Nonfocal.  Normal muscle tone.  PSYCHIATRIC: Normal mood and affect. Normal behavior. Cooperative  CARDIAC DATABASE: EKG: 04/03/2021: NSR, 66 bpm, first-degree AV block, left axis, possible old anteroseptal infarct, nonspecific T wave changes.  Echocardiogram: 04/10/2021:  Left ventricle cavity is normal in size. Severe concentric hypertrophy of the left ventricle. Speckled pattern suggests infiltrative cardiomyopathy.  Hypokinetic global wall motion. Doppler evidence of grade I (impaired) diastolic dysfunction, normal LAP. Severely depressed LV systolic function with visual EF 25-30%. Calculated EF 30%.  Left atrial cavity is severely dilated at 5.2 cm. Right atrial cavity is mildly dilated.  Right ventricle cavity is normal in size. Mild concentric hypertrophy of the right ventricle. Severely reduced right ventricular function.  Trileaflet aortic valve.  Trace aortic regurgitation.  Structurally normal mitral valve.  Moderate (Grade II) mitral regurgitation.  Structurally normal tricuspid valve.  Moderate tricuspid regurgitation.  Severe pulmonary hypertension. RVSP measures 72 mmHg.  Pericardium is normal. Insignificant pericardial effusion.  IVC is dilated with poor inspiration collapse consistent with elevated right atrial pressure.   Stress Testing: Exercise/Lexiscan Tetrofosmin stress test 05/06/2021:  Exercise nuclear stress test was performed using Bruce protocol. Patient reached 5.6 METS, and 71% of age predicted maximum heart rate. Because of sub-maximal HR the patient was injected with 0.4 mg of Intravenous Lexiscan over 15 sec. Exercise capacity was low. No chest pain reported. Dyspnea and dizziness reported. Blunted heart rate response. Unable to comment on post exercise blood pressure, as reported blood pressure after stress is only after Lexiscan injection. Stress EKG at 71% MPHR showed sinus rhythm, LAFB, occasional PVC, no ischemic changes.  Dilated LV cavity with moderate global decrease  in myocardial wall motion and thickening. Stress LVEF 41%. SPECT images show small area of mild intensity, fixed perfusion defect in basal inferior myocardium. No definite evidence of ischemia. High risk study in the setting of dilated cardiomyopathy.   Heart Catheterization: None  CT Cardiac Scoring 04/13/2021 1. Coronary calcium score is 1130 and this is at percentile 93 for patients of the same age, gender and ethnicity.  2. Nonspecific patchy densities at the lung bases which could represent combination of chronic changes and/or atelectasis. Inaddition, there are small indeterminate nodule, largest measures 5 mm. No follow-up needed if patient is low-risk (and has no known or suspected primary neoplasm). Non-contrast chest CT can be considered in 12 months if patient is high-risk. This recommendation follows the consensus statement: Guidelines for Management of Incidental Pulmonary Nodules Detected on CT Images: From the Fleischner Society 2017; Radiology 2017; 284:228-243.  3. Sclerotic focus involving a thoracic vertebral body. This has benign characteristics but recommend correlation with PSA level based on history of prostate cancer.  4. Mild cardiomegaly.  LABORATORY DATA: External Labs: Collected: December 01, 2020 Sodium 144, potassium 4.9, chloride 108, bicarb 26. AST 17, ALT 12, alkaline phosphatase 82. Hemoglobin 12 g/dL, hematocrit 38.3%. BNP 362. NT proBNP 2741. Hemoglobin A1c 6  Collected: February 19, 2021. BUN 36, creatinine 2.19 mg/dL. eGFR 31 Sodium 144, potassium 4.5, chloride 109, bicarbonate 25. AST 16, ALT 13, alkaline phosphatase 81. PSA 15. NT proBNP 3304. Uric acid 7.7     Latest Ref Rng & Units 03/27/2013   10:12 AM 08/06/2011    4:57 AM 06/08/2011    5:32 AM  CBC  WBC 4.0 - 10.5 K/uL  10.6  7.7   Hemoglobin 13.0 - 17.0 g/dL 13.9  12.5  10.8   Hematocrit 39.0 - 52.0 % 41.0  37.7  32.9   Platelets 150 - 400 K/uL  158  151        Latest Ref Rng &  Units 08/11/2021    8:40 AM 06/29/2021    9:25 AM 05/20/2021    9:12 AM  CMP  Glucose 70 - 99 mg/dL 113   104   BUN 8 - 27 mg/dL 38   28   Creatinine 0.76 - 1.27 mg/dL 2.39   2.03   Sodium 134 - 144 mmol/L 142   146   Potassium 3.5 - 5.2 mmol/L 3.9   3.9   Chloride 96 - 106 mmol/L 109   109   CO2 20 - 29 mmol/L 17   21   Calcium 8.6 - 10.2 mg/dL 9.0   8.9   Total Protein 6.0 - 8.5 g/dL  6.7    Total Bilirubin 0.0 - 1.2 mg/dL  2.0    Alkaline Phos 44 - 121 IU/L  102    AST 0 - 40 IU/L  31    ALT 0 - 44 IU/L  26      Lipid Panel  Component Value Date/Time   CHOL 128 06/29/2021 0925   TRIG 61 06/29/2021 0925   HDL 49 06/29/2021 0925   LDLCALC 66 06/29/2021 0925   LDLDIRECT 117 (H) 04/08/2021 0818   LABVLDL 13 06/29/2021 0925    No components found for: "NTPROBNP" Recent Labs    04/08/21 0818 04/27/21 0900 05/20/21 0912 08/11/21 0841  PROBNP 4,300* 4,660* 8,074* 8,349*   No results for input(s): "TSH" in the last 8760 hours.  BMP Recent Labs    04/27/21 0900 05/20/21 0912 08/11/21 0840  NA 144 146* 142  K 4.1 3.9 3.9  CL 110* 109* 109*  CO2 16* 21 17*  GLUCOSE 118* 104* 113*  BUN 23 28* 38*  CREATININE 2.01* 2.03* 2.39*  CALCIUM 9.0 8.9 9.0    HEMOGLOBIN A1C Lab Results  Component Value Date   HGBA1C 6.0 11/30/2011   MPG 134 (H) 06/07/2011    Cardiac Panel (last 3 results) No results for input(s): "CKTOTAL", "CKMB", "TROPONINIHS", "RELINDX" in the last 72 hours.  CHOLESTEROL Recent Labs    04/08/21 0818 06/29/21 0925  CHOL 178 128    Hepatic Function Panel Recent Labs    04/08/21 0818 06/29/21 0925  PROT 7.3 6.7  ALBUMIN 4.2 3.9  AST 27 31  ALT 19 26  ALKPHOS 104 102  BILITOT 1.8* 2.0*  BILIDIR  --  0.52*   IMPRESSION:    ICD-10-CM   1. Acute combined systolic and diastolic heart failure (HCC)  I50.41 metolazone (ZAROXOLYN) 2.5 MG tablet    torsemide (DEMADEX) 10 MG tablet    Pro b natriuretic peptide (BNP)    Basic metabolic  panel    Magnesium    2. Cardiomyopathy, unspecified type ([place])  I42.9 torsemide (DEMADEX) 10 MG tablet    3. Coronary atherosclerosis due to calcified coronary lesion  I25.10    I25.84     4. Hypercholesteremia  E78.00     5. Pulmonary nodule  R91.1     6. Prostate cancer [facility])  C61        RECOMMENDATIONS: Joel Duarte is a 76 y.o. African-American male whose past medical history and cardiac risk factors include: Cardiomyopathy, chronic combined systolic and diastolic heart failure, severe coronary artery calcification (total CAC 1130/93rd percentile), elevated LP(a), hypertension with chronic kidney disease stage IIIb, erectile dysfunction, gout, history of seizures, central sleep apnea, prediabetes, prostate cancer, pulmonary nodules, advanced age.  Acute combined systolic and diastolic congestive heart failure, NYHA class 3 (HCC)/cardiomyopathy Acute exacerbation Stage B, NYHA class III Weight increased by 8 pounds since last office visit. On physical examination he has bilateral LE swelling extending to the lateral thighs and the lower abdominal wall.  Labs from August 11, 2021 independently reviewed. Given the acute exacerbation of heart failure recommended elective hospital admission for parenteral diuretics and close monitoring of electrolytes and telemetry.  However, patient is reluctant. Start taking torsemide 10 mg p.o. daily and to start Zaroxolyn 2.5 mg Monday Wednesday and Friday.  Labs in 1 week prior to the office visit. If he continues to gain weight despite taking torsemide regularly and starting Zaroxolyn patient voices understanding that he will go to the hospital for diuresis. Of note, in the past Entresto discontinued due to worsening renal function and spironolactone discontinued secondary to him feeling tired/fatigued/worn out.  Currently he is taking BiDil 20/37.5 half a tablet 3 times daily.  I have asked him to take 20/37.5 mg p.o. twice daily.   Coronary  atherosclerosis due to calcified coronary  lesion Continue aspirin and statin therapy Total CAC 1130, 93rd percentile Follow lipids. Has undergone ischemic work-up including MPI which was reported to be high risk for reasons mentioned above but no overt reversible ischemia.  Given his chronic kidney disease stage IIIb/IV shared decision was to hold off on additional work-up such as cath/coronary CTA to prevent contrast-induced nephropathy and predisposing him to worsening renal function especially in an asymptomatic individual. Educated on seeking medical attention sooner by going to the closest ER via EMS if he has chest pain or worsening heart failure symptoms. Patient verbalized understanding.  Hypercholesteremia /elevated LP(a) Currently on atorvastatin.   Initial LDL levels 117 mg/dL and LP(a) 104.9 He denies myalgia or other side effects. Most recent lipids dated June 2023, independently reviewed as noted above.  LDL-C 66 mg/dL  Prediabetes Management per primary team. Initial plan was to add ARB or a retrial of Entresto at 24/26 mg p.o. twice daily.  However given the volume overload we will focus on diuresis for now.  Central sleep apnea Reemphasized importance of CPAP compliance.  He remains reluctant  Prostate cancer ([place]) In the past I have spoken to his PCP with the concerns of the lung findings & sclerotic foci in the thoracic vertebral bodies given his history of prostate cancer.  I have requested that he discuss these findings with his urologist as well.  Will defer management to PCP/urology at this time.   Pulmonary nodule We will defer management to primary team.  Mr. Blank is noted to have CKD stage IIIb/IV since establishing care with myself back in March 2023 and I suspect that his renal function may worsen as he requires more diuretics to maintain volume control.  In the past office visits I have asked him to consider establishing care with nephrology given the  degree of chronic kidney disease.  However, this is still pending.  I offered to place a referral to nephrology given his CKD; however, patient states that he will follow-up with PCP.  Discussed management of acute exacerbation of CHF, independently reviewed labs from July 2023, and prior cardiovascular work-up.  Given the degree of volume overload recommended elective hospitalization for diuresis, electrolyte management, and rhythm management.  However, patient would like to continue outpatient treatment but understands the limitations as well.  He is willing to go to the ED if he continues to gain weight despite up titration of diuretic therapy.  I would like to see him back in 1 week and labs prior to the next office visit.  FINAL MEDICATION LIST END OF ENCOUNTER: Meds ordered this encounter  Medications   metolazone (ZAROXOLYN) 2.5 MG tablet    Sig: Take 1 tablet (2.5 mg total) by mouth every Monday, Wednesday, and Friday for 30 doses.    Dispense:  30 tablet    Refill:  0   torsemide (DEMADEX) 10 MG tablet    Sig: Take 1 tablet (10 mg total) by mouth every morning.    Dispense:  90 tablet    Refill:  0     Current Outpatient Medications:    allopurinol (ZYLOPRIM) 100 MG tablet, Take 100 mg by mouth daily., Disp: , Rfl:    allopurinol (ZYLOPRIM) 100 MG tablet, Take 1 tablet (100 mg total) by mouth daily., Disp: 90 tablet, Rfl: 2   aspirin EC 81 MG tablet, Take 1 tablet (81 mg total) by mouth daily. Swallow whole., Disp: 30 tablet, Rfl: 11   atorvastatin (LIPITOR) 40 MG tablet, Take 1 tablet (40  mg total) by mouth at bedtime., Disp: 90 tablet, Rfl: 0   azithromycin (ZITHROMAX) 250 MG tablet, take 2 tablets on day 1, then one tablet daily, Disp: 6 tablet, Rfl: 0   carvedilol (COREG) 25 MG tablet, Take 1 tablet (25 mg total) by mouth 2 (two) times daily., Disp: 180 tablet, Rfl: 2   dapagliflozin propanediol (FARXIGA) 10 MG TABS tablet, Take 1 tablet (10 mg total) by mouth daily before  breakfast., Disp: 90 tablet, Rfl: 0   doxazosin (CARDURA) 8 MG tablet, Take 1 tablet (8 mg total) by mouth in the morning AND 0.5 tablets (4 mg total) at bedtime., Disp: 135 tablet, Rfl: 2   dutasteride (AVODART) 0.5 MG capsule, Take 1 capsule (0.5 mg total) by mouth daily., Disp: 90 capsule, Rfl: 3   isosorbide-hydrALAZINE (BIDIL) 20-37.5 MG tablet, Take 1 tablet by mouth 3 (three) times daily. (Patient taking differently: Take 1 tablet by mouth 2 (two) times daily.), Disp: 270 tablet, Rfl: 0   levETIRAcetam (KEPPRA) 750 MG tablet, Take 1 tablet (750 mg total) by mouth 2 (two) times daily., Disp: 180 tablet, Rfl: 3   [START ON 08/14/2021] metolazone (ZAROXOLYN) 2.5 MG tablet, Take 1 tablet (2.5 mg total) by mouth every Monday, Wednesday, and Friday for 30 doses., Disp: 30 tablet, Rfl: 0   omeprazole (PRILOSEC) 40 MG capsule, Take 1 capsule (40 mg total) by mouth daily., Disp: 30 capsule, Rfl: 3   azelastine (ASTELIN) 0.1 % nasal spray, USE ONE PUFF IN EACH NOSTRIL TWICE A DAY AS NEEDED, Disp: 30 mL, Rfl: 6   fluticasone (FLONASE) 50 MCG/ACT nasal spray, USE 1 SPRAY INTO EACH NOSTRIL TWO TIMES DAILY AS NEEDED, Disp: 16 g, Rfl: 5   torsemide (DEMADEX) 10 MG tablet, Take 1 tablet (10 mg total) by mouth every morning., Disp: 90 tablet, Rfl: 0  Orders Placed This Encounter  Procedures   Pro b natriuretic peptide (BNP)   Basic metabolic panel   Magnesium    There are no Patient Instructions on file for this visit.   --Continue cardiac medications as reconciled in final medication list. --Return in about 1 week (around 08/20/2021) for Follow up, heart failure management.. Or sooner if needed. --Continue follow-up with your primary care physician regarding the management of your other chronic comorbid conditions.  Patient's questions and concerns were addressed to his satisfaction. He voices understanding of the instructions provided during this encounter.   This note was created using a voice  recognition software as a result there may be grammatical errors inadvertently enclosed that do not reflect the nature of this encounter. Every attempt is made to correct such errors.  Rex Kras, [place], [facility]  Pager: [phone] Office: [phone]

## 2021-08-19 ENCOUNTER — Other Ambulatory Visit: Payer: Self-pay | Admitting: Cardiology

## 2021-08-20 ENCOUNTER — Encounter: Payer: Self-pay | Admitting: Cardiology

## 2021-08-20 ENCOUNTER — Ambulatory Visit: Payer: Medicare Other | Admitting: Cardiology

## 2021-08-20 VITALS — BP 142/83 | HR 50 | Temp 96.9°F | Resp 16 | Ht 74.0 in | Wt 222.4 lb

## 2021-08-20 DIAGNOSIS — R911 Solitary pulmonary nodule: Secondary | ICD-10-CM

## 2021-08-20 DIAGNOSIS — I2510 Atherosclerotic heart disease of native coronary artery without angina pectoris: Secondary | ICD-10-CM

## 2021-08-20 DIAGNOSIS — I429 Cardiomyopathy, unspecified: Secondary | ICD-10-CM

## 2021-08-20 DIAGNOSIS — G4731 Primary central sleep apnea: Secondary | ICD-10-CM

## 2021-08-20 DIAGNOSIS — E7841 Elevated Lipoprotein(a): Secondary | ICD-10-CM

## 2021-08-20 DIAGNOSIS — I5041 Acute combined systolic (congestive) and diastolic (congestive) heart failure: Secondary | ICD-10-CM

## 2021-08-20 DIAGNOSIS — E7800 Pure hypercholesterolemia, unspecified: Secondary | ICD-10-CM

## 2021-08-20 LAB — BASIC METABOLIC PANEL
BUN/Creatinine Ratio: 17 (ref 10–24)
BUN: 37 mg/dL — ABNORMAL HIGH (ref 8–27)
CO2: 19 mmol/L — ABNORMAL LOW (ref 20–29)
Calcium: 9 mg/dL (ref 8.6–10.2)
Chloride: 102 mmol/L (ref 96–106)
Creatinine, Ser: 2.18 mg/dL — ABNORMAL HIGH (ref 0.76–1.27)
Glucose: 111 mg/dL — ABNORMAL HIGH (ref 70–99)
Potassium: 3.7 mmol/L (ref 3.5–5.2)
Sodium: 140 mmol/L (ref 134–144)
eGFR: 31 mL/min/{1.73_m2} — ABNORMAL LOW (ref >59)

## 2021-08-20 LAB — PRO B NATRIURETIC PEPTIDE: NT-Pro BNP: 7500 pg/mL — ABNORMAL HIGH (ref 0–486)

## 2021-08-20 LAB — MAGNESIUM: Magnesium: 2.3 mg/dL (ref 1.6–2.3)

## 2021-08-20 NOTE — Progress Notes (Signed)
ID:  Joel Duarte, DOB [birth year], MRN [id number]  PCP:  Rogers Blocker, MD  Cardiologist:  Rex Kras, DO, [facility] (established care April 03, 2021)  Date: 08/20/21 Last Office Visit: 08/13/2021  Chief Complaint  Patient presents with   heart failure management   Follow-up    HPI  Joel Duarte is a 76 y.o. African-American male whose past medical history and cardiovascular risk factors include: Cardiomyopathy, chronic combined systolic and diastolic heart failure, severe coronary artery calcification (total CAC 1130/93rd percentile), elevated LP(a), hypertension with chronic kidney disease stage 3b/4, erectile dysfunction, gout, history of seizures, central sleep apnea, prediabetes, prostate cancer, pulmonary nodules, advanced age.  Given his severe coronary artery calcification patient did undergo ischemic work-up since establishing care.  His nuclear stress test in April 2023 reported to be high risk due to reduced LVEF, dilated LV cavity, but no definitive evidence of ischemia.  Echo noted severely reduced LVEF with grade 1 diastolic impairment.  Since then his GDMT has been uptitrated.  Given the fact that he has been asymptomatic (no angina pectoris) and chronic kidney disease stage IIIb/IV exposing him to additional contrast may further worsen his renal function placing him at a higher risk of hemodialysis.  Therefore we will work on up titration of GDMT and keeping him as euvolemic as possible.  At the last office visit in July 2023 he had gained at least 8 pounds with lower extremity swelling up to the abdomen.  He was requested to go to ED for parenteral diuresis and close monitoring of renal function, electrolytes, and arrhythmia.  He refused and therefore his diuretic therapies were uptitrated.  Over the last 1 week patient has lost 8 pounds and his renal function has improved.  NT proBNP is slowly improving as well.  He does not have swelling in the bilateral lower extremity because he  is wearing compression stockings but does have swelling in the lateral thighs and lower abdomen but much improved compared to last week.   FUNCTIONAL STATUS: No structured exercise program or daily routine.   ALLERGIES: No Known Allergies  MEDICATION LIST PRIOR TO VISIT: Current Meds  Medication Sig   allopurinol (ZYLOPRIM) 100 MG tablet Take 100 mg by mouth daily.   allopurinol (ZYLOPRIM) 100 MG tablet Take 1 tablet (100 mg total) by mouth daily.   aspirin EC 81 MG tablet Take 1 tablet (81 mg total) by mouth daily. Swallow whole.   atorvastatin (LIPITOR) 40 MG tablet Take 1 tablet (40 mg total) by mouth at bedtime.   azelastine (ASTELIN) 0.1 % nasal spray USE ONE PUFF IN EACH NOSTRIL TWICE A DAY AS NEEDED   carvedilol (COREG) 25 MG tablet Take 1 tablet (25 mg total) by mouth 2 (two) times daily.   dapagliflozin propanediol (FARXIGA) 10 MG TABS tablet Take 1 tablet (10 mg total) by mouth daily before breakfast.   doxazosin (CARDURA) 8 MG tablet Take 1 tablet (8 mg total) by mouth in the morning AND 0.5 tablets (4 mg total) at bedtime.   dutasteride (AVODART) 0.5 MG capsule Take 1 capsule (0.5 mg total) by mouth daily.   fluticasone (FLONASE) 50 MCG/ACT nasal spray USE 1 SPRAY INTO EACH NOSTRIL TWO TIMES DAILY AS NEEDED   isosorbide-hydrALAZINE (BIDIL) 20-37.5 MG tablet Take 1 tablet by mouth 3 (three) times daily. (Patient taking differently: Take 1 tablet by mouth 2 (two) times daily.)   levETIRAcetam (KEPPRA) 750 MG tablet Take 1 tablet (750 mg total) by mouth 2 (two) times daily.  metolazone (ZAROXOLYN) 2.5 MG tablet Take 1 tablet (2.5 mg total) by mouth every Monday, Wednesday, and Friday for 30 doses.   omeprazole (PRILOSEC) 40 MG capsule Take 1 capsule (40 mg total) by mouth daily.   torsemide (DEMADEX) 10 MG tablet Take 1 tablet (10 mg total) by mouth every morning.     PAST MEDICAL HISTORY: Past Medical History:  Diagnosis Date   Allergy    Enlarged prostate    Gout     Hyperlipidemia    Hypertension    Osteoarthrosis, unspecified whether generalized or localized, unspecified site    Seizures ([place])    history of 1 a year ago    PAST SURGICAL HISTORY: Past Surgical History:  Procedure Laterality Date   no surgical h/o  June 2014    FAMILY HISTORY: The patient family history includes Healthy in his child and sister; Heart Problems in his mother; Hypertension in his mother.  SOCIAL HISTORY:  The patient  reports that he has never smoked. He has never used smokeless tobacco. He reports that he does not currently use alcohol. He reports that he does not use drugs.  REVIEW OF SYSTEMS: Review of Systems  Constitutional: Positive for malaise/fatigue (improved) and weight gain.  Cardiovascular:  Positive for dyspnea on exertion and leg swelling. Negative for chest pain, orthopnea, palpitations, paroxysmal nocturnal dyspnea and syncope.  Respiratory:  Negative for shortness of breath.     PHYSICAL EXAM:    08/20/2021    3:17 PM 08/13/2021    2:10 PM 07/02/2021    3:08 PM  Vitals with BMI  Height 6' 2"  6' 2"  6' 2"   Weight 222 lbs 6 oz 230 lbs 3 oz 222 lbs 3 oz  BMI 28.54 36.64 40.34  Systolic 742 595 638  Diastolic 83 75 85  Pulse 50 66 68    CONSTITUTIONAL: Well-developed and well-nourished. No acute distress.  SKIN: Skin is warm and dry. No rash noted. No cyanosis. No pallor. No jaundice HEAD: Normocephalic and atraumatic.  EYES: No scleral icterus MOUTH/THROAT: Moist oral membranes.  NECK: JVD present. No thyromegaly noted. No carotid bruits  CHEST Normal respiratory effort. No intercostal retractions  LUNGS: Clear to auscultation bilaterally.  No stridor. No wheezes. No rales.  CARDIOVASCULAR: Regular rate and rhythm, positive V5-I4, soft holosystolic murmur heard at the apex, no rubs or gallops appreciated. ABDOMINAL: Pitting edema (improved compared to last week), nontender, nondistended, positive bowel sounds all 4 quadrants. No apparent  ascites.  EXTREMITIES: no bilateral peripheral edema, +1 edema extending to the lateral thighs and the abdominal wall, warm to touch, compression stockings present HEMATOLOGIC: No significant bruising NEUROLOGIC: Oriented to person, place, and time. Nonfocal. Normal muscle tone.  PSYCHIATRIC: Normal mood and affect. Normal behavior. Cooperative  CARDIAC DATABASE: EKG: 04/03/2021: NSR, 66 bpm, first-degree AV block, left axis, possible old anteroseptal infarct, nonspecific T wave changes.  Echocardiogram: 04/10/2021:  Left ventricle cavity is normal in size. Severe concentric hypertrophy of the left ventricle. Speckled pattern suggests infiltrative cardiomyopathy.  Hypokinetic global wall motion. Doppler evidence of grade I (impaired) diastolic dysfunction, normal LAP. Severely depressed LV systolic function with visual EF 25-30%. Calculated EF 30%.  Left atrial cavity is severely dilated at 5.2 cm. Right atrial cavity is mildly dilated.  Right ventricle cavity is normal in size. Mild concentric hypertrophy of the right ventricle. Severely reduced right ventricular function.  Trileaflet aortic valve.  Trace aortic regurgitation.  Structurally normal mitral valve.  Moderate (Grade II) mitral regurgitation.  Structurally normal  tricuspid valve.  Moderate tricuspid regurgitation.  Severe pulmonary hypertension. RVSP measures 72 mmHg.  Pericardium is normal. Insignificant pericardial effusion.  IVC is dilated with poor inspiration collapse consistent with elevated right atrial pressure.   Stress Testing: Exercise/Lexiscan Tetrofosmin stress test 05/06/2021: Exercise nuclear stress test was performed using Bruce protocol. Patient reached 5.6 METS, and 71% of age predicted maximum heart rate. Because of sub-maximal HR the patient was injected with 0.4 mg of Intravenous Lexiscan over 15 sec. Exercise capacity was low. No chest pain reported. Dyspnea and dizziness reported. Blunted heart rate response.  Unable to comment on post exercise blood pressure, as reported blood pressure after stress is only after Lexiscan injection. Stress EKG at 71% MPHR showed sinus rhythm, LAFB, occasional PVC, no ischemic changes.  Dilated LV cavity with moderate global decrease in myocardial wall motion and thickening. Stress LVEF 41%. SPECT images show small area of mild intensity, fixed perfusion defect in basal inferior myocardium. No definite evidence of ischemia. High risk study in the setting of dilated cardiomyopathy.   Heart Catheterization: None  CT Cardiac Scoring 04/13/2021 1. Coronary calcium score is 1130 and this is at percentile 93 for patients of the same age, gender and ethnicity.  2. Nonspecific patchy densities at the lung bases which could represent combination of chronic changes and/or atelectasis. Inaddition, there are small indeterminate nodule, largest measures 5 mm. No follow-up needed if patient is low-risk (and has no known or suspected primary neoplasm). Non-contrast chest CT can be considered in 12 months if patient is high-risk. This recommendation follows the consensus statement: Guidelines for Management of Incidental Pulmonary Nodules Detected on CT Images: From the Fleischner Society 2017; Radiology 2017; 284:228-243.  3. Sclerotic focus involving a thoracic vertebral body. This has benign characteristics but recommend correlation with PSA level based on history of prostate cancer.  4. Mild cardiomegaly.  LABORATORY DATA: External Labs: Collected: December 01, 2020 Sodium 144, potassium 4.9, chloride 108, bicarb 26. AST 17, ALT 12, alkaline phosphatase 82. Hemoglobin 12 g/dL, hematocrit 38.3%. BNP 362. NT proBNP 2741. Hemoglobin A1c 6  Collected: February 19, 2021. BUN 36, creatinine 2.19 mg/dL. eGFR 31 Sodium 144, potassium 4.5, chloride 109, bicarbonate 25. AST 16, ALT 13, alkaline phosphatase 81. PSA 15. NT proBNP 3304. Uric acid 7.7     Latest Ref Rng & Units  03/27/2013   10:12 AM 08/06/2011    4:57 AM 06/08/2011    5:32 AM  CBC  WBC 4.0 - 10.5 K/uL  10.6  7.7   Hemoglobin 13.0 - 17.0 g/dL 13.9  12.5  10.8   Hematocrit 39.0 - 52.0 % 41.0  37.7  32.9   Platelets 150 - 400 K/uL  158  151        Latest Ref Rng & Units 08/19/2021    9:12 AM 08/11/2021    8:40 AM 06/29/2021    9:25 AM  CMP  Glucose 70 - 99 mg/dL 111  113    BUN 8 - 27 mg/dL 37  38    Creatinine 0.76 - 1.27 mg/dL 2.18  2.39    Sodium 134 - 144 mmol/L 140  142    Potassium 3.5 - 5.2 mmol/L 3.7  3.9    Chloride 96 - 106 mmol/L 102  109    CO2 20 - 29 mmol/L 19  17    Calcium 8.6 - 10.2 mg/dL 9.0  9.0    Total Protein 6.0 - 8.5 g/dL   6.7   Total Bilirubin 0.0 -  1.2 mg/dL   2.0   Alkaline Phos 44 - 121 IU/L   102   AST 0 - 40 IU/L   31   ALT 0 - 44 IU/L   26     Lipid Panel     Component Value Date/Time   CHOL 128 06/29/2021 0925   TRIG 61 06/29/2021 0925   HDL 49 06/29/2021 0925   LDLCALC 66 06/29/2021 0925   LDLDIRECT 117 (H) 04/08/2021 0818   LABVLDL 13 06/29/2021 0925    No components found for: "NTPROBNP" Recent Labs    04/08/21 0818 04/27/21 0900 05/20/21 0912 08/11/21 0841 08/19/21 0912  PROBNP 4,300* 4,660* 8,074* 8,349* 7,500*   No results for input(s): "TSH" in the last 8760 hours.  BMP Recent Labs    05/20/21 0912 08/11/21 0840 08/19/21 0912  NA 146* 142 140  K 3.9 3.9 3.7  CL 109* 109* 102  CO2 21 17* 19*  GLUCOSE 104* 113* 111*  BUN 28* 38* 37*  CREATININE 2.03* 2.39* 2.18*  CALCIUM 8.9 9.0 9.0    HEMOGLOBIN A1C Lab Results  Component Value Date   HGBA1C 6.0 11/30/2011   MPG 134 (H) 06/07/2011    Cardiac Panel (last 3 results) No results for input(s): "CKTOTAL", "CKMB", "TROPONINIHS", "RELINDX" in the last 72 hours.  CHOLESTEROL Recent Labs    04/08/21 0818 06/29/21 0925  CHOL 178 128    Hepatic Function Panel Recent Labs    04/08/21 0818 06/29/21 0925  PROT 7.3 6.7  ALBUMIN 4.2 3.9  AST 27 31  ALT 19 26   ALKPHOS 104 102  BILITOT 1.8* 2.0*  BILIDIR  --  0.52*   IMPRESSION:    ICD-10-CM   1. Acute combined systolic and diastolic heart failure (HCC)  I50.41 Pro b natriuretic peptide (BNP)    Basic metabolic panel    Magnesium    2. Cardiomyopathy, unspecified type ([place])  I42.9     3. Coronary atherosclerosis due to calcified coronary lesion  I25.10    I25.84     4. Hypercholesteremia  E78.00     5. Pulmonary nodule  R91.1     6. Central sleep apnea  G47.31     7. Elevated Lp(a)  E78.41        RECOMMENDATIONS: Joel Duarte is a 76 y.o. African-American male whose past medical history and cardiac risk factors include: Cardiomyopathy, chronic combined systolic and diastolic heart failure, severe coronary artery calcification (total CAC 1130/93rd percentile), elevated LP(a), hypertension with chronic kidney disease stage IIIb, erectile dysfunction, gout, history of seizures, central sleep apnea, prediabetes, prostate cancer, pulmonary nodules, advanced age.  Acute combined systolic and diastolic congestive heart failure, NYHA class 3 (HCC)/cardiomyopathy Acute exacerbation Stage B, NYHA class II/III Weight decreased by 8 pounds since last office visit. On physical examination no LE swelling as he wears compression stockings; however, had +1 swelling w/n the  lateral thighs and lower abdominal wall (improved compared to last week).  Labs from 08/18/2021 independently reviewed. Given his renal function will continue w/ torsemide 10 mg p.o. daily and Zaroxolyn 2.5 mg Monday Wednesday & Friday.  Labs in 1 week prior to the office visit. If renal function stable will increase Zaroxolyn.  Of note, in the past Entresto discontinued due to worsening renal function and spironolactone discontinued secondary to him feeling tired/fatigued/worn out.  He has was not able to tolerate BiDil 20/37.5 tid; therefore, he takes it bid.   Coronary atherosclerosis due to calcified coronary  lesion Continue  aspirin and statin therapy Total CAC 1130, 93rd percentile Follow lipids. Has undergone ischemic work-up including MPI which was reported to be high risk for reasons mentioned above but no overt reversible ischemia.  Given his chronic kidney disease stage IIIb/IV shared decision was to hold off on additional work-up such as cath/coronary CTA to prevent contrast-induced nephropathy and predisposing him to worsening renal function especially in an asymptomatic individual. However, if has CP or worsening HF symptoms he is educated on seeking medical attention sooner by going to the closest ER via EMS. Patient verbalized understanding.  Hypercholesteremia /elevated LP(a) Currently on atorvastatin.   Initial LDL levels 117 mg/dL and LP(a) 104.9 He denies myalgia or other side effects. Most recent lipids dated June 2023, independently and LDL-C now 66 mg/dL  Prediabetes Management per primary team. Initial plan was to add ARB or a retrial of Entresto at 24/26 mg p.o. twice daily.  However given the volume overload we will focus on diuresis for now.  Central sleep apnea Reemphasized importance of continued CPAP compliance.    Prostate cancer ([place]) In the past I have spoken to his PCP with the concerns of the lung findings & sclerotic foci in the thoracic vertebral bodies given his history of prostate cancer.  I have requested that he discuss these findings with his urologist as well.  Will defer management to PCP/urology at this time.   Pulmonary nodule We will defer management to primary team.   FINAL MEDICATION LIST END OF ENCOUNTER: No orders of the defined types were placed in this encounter.    Current Outpatient Medications:    allopurinol (ZYLOPRIM) 100 MG tablet, Take 100 mg by mouth daily., Disp: , Rfl:    allopurinol (ZYLOPRIM) 100 MG tablet, Take 1 tablet (100 mg total) by mouth daily., Disp: 90 tablet, Rfl: 2   aspirin EC 81 MG tablet, Take 1 tablet (81 mg  total) by mouth daily. Swallow whole., Disp: 30 tablet, Rfl: 11   atorvastatin (LIPITOR) 40 MG tablet, Take 1 tablet (40 mg total) by mouth at bedtime., Disp: 90 tablet, Rfl: 0   azelastine (ASTELIN) 0.1 % nasal spray, USE ONE PUFF IN EACH NOSTRIL TWICE A DAY AS NEEDED, Disp: 30 mL, Rfl: 6   carvedilol (COREG) 25 MG tablet, Take 1 tablet (25 mg total) by mouth 2 (two) times daily., Disp: 180 tablet, Rfl: 2   dapagliflozin propanediol (FARXIGA) 10 MG TABS tablet, Take 1 tablet (10 mg total) by mouth daily before breakfast., Disp: 90 tablet, Rfl: 0   doxazosin (CARDURA) 8 MG tablet, Take 1 tablet (8 mg total) by mouth in the morning AND 0.5 tablets (4 mg total) at bedtime., Disp: 135 tablet, Rfl: 2   dutasteride (AVODART) 0.5 MG capsule, Take 1 capsule (0.5 mg total) by mouth daily., Disp: 90 capsule, Rfl: 3   fluticasone (FLONASE) 50 MCG/ACT nasal spray, USE 1 SPRAY INTO EACH NOSTRIL TWO TIMES DAILY AS NEEDED, Disp: 16 g, Rfl: 5   isosorbide-hydrALAZINE (BIDIL) 20-37.5 MG tablet, Take 1 tablet by mouth 3 (three) times daily. (Patient taking differently: Take 1 tablet by mouth 2 (two) times daily.), Disp: 270 tablet, Rfl: 0   levETIRAcetam (KEPPRA) 750 MG tablet, Take 1 tablet (750 mg total) by mouth 2 (two) times daily., Disp: 180 tablet, Rfl: 3   metolazone (ZAROXOLYN) 2.5 MG tablet, Take 1 tablet (2.5 mg total) by mouth every Monday, Wednesday, and Friday for 30 doses., Disp: 30 tablet, Rfl: 0   omeprazole (PRILOSEC) 40 MG capsule,  Take 1 capsule (40 mg total) by mouth daily., Disp: 30 capsule, Rfl: 3   torsemide (DEMADEX) 10 MG tablet, Take 1 tablet (10 mg total) by mouth every morning., Disp: 90 tablet, Rfl: 0  Orders Placed This Encounter  Procedures   Pro b natriuretic peptide (BNP)   Basic metabolic panel   Magnesium    There are no Patient Instructions on file for this visit.   --Continue cardiac medications as reconciled in final medication list. --Return in about 4 weeks (around  09/17/2021) for Follow up, heart failure management.. Or sooner if needed. --Continue follow-up with your primary care physician regarding the management of your other chronic comorbid conditions.  Patient's questions and concerns were addressed to his satisfaction. He voices understanding of the instructions provided during this encounter.   This note was created using a voice recognition software as a result there may be grammatical errors inadvertently enclosed that do not reflect the nature of this encounter. Every attempt is made to correct such errors.  Rex Kras, [place], [facility]  Pager: [phone] Office: [phone]

## 2021-08-27 LAB — BASIC METABOLIC PANEL
BUN/Creatinine Ratio: 20 (ref 10–24)
BUN: 39 mg/dL — ABNORMAL HIGH (ref 8–27)
CO2: 20 mmol/L (ref 20–29)
Calcium: 9.3 mg/dL (ref 8.6–10.2)
Chloride: 103 mmol/L (ref 96–106)
Creatinine, Ser: 2 mg/dL — ABNORMAL HIGH (ref 0.76–1.27)
Glucose: 115 mg/dL — ABNORMAL HIGH (ref 70–99)
Potassium: 3.7 mmol/L (ref 3.5–5.2)
Sodium: 143 mmol/L (ref 134–144)
eGFR: 34 mL/min/{1.73_m2} — ABNORMAL LOW (ref >59)

## 2021-08-27 LAB — PRO B NATRIURETIC PEPTIDE: NT-Pro BNP: 7398 pg/mL — ABNORMAL HIGH (ref 0–486)

## 2021-08-27 LAB — MAGNESIUM: Magnesium: 2.5 mg/dL — ABNORMAL HIGH (ref 1.6–2.3)

## 2021-08-28 ENCOUNTER — Other Ambulatory Visit: Payer: Self-pay | Admitting: Cardiology

## 2021-08-28 ENCOUNTER — Telehealth: Payer: Self-pay | Admitting: Cardiology

## 2021-08-28 DIAGNOSIS — I5041 Acute combined systolic (congestive) and diastolic (congestive) heart failure: Secondary | ICD-10-CM

## 2021-08-28 NOTE — Telephone Encounter (Signed)
Patient says after last appointment he was told to call back and speak w/ST. Please call back.

## 2021-08-28 NOTE — Progress Notes (Signed)
These labs reflect Mr. Tangney being on torsemide 10 mg p.o. daily and metolazone 2.5 mg Monday Wednesday and Friday. His weight today was 211 pounds. His urine flow is constant and no significant decrease. He still has swelling in bilateral thighs and abdomen but much improved since last week. Renal function relatively at baseline. NT-proBNP improving.  Recommendations: Continue torsemide 10 mg p.o. daily and metolazone 2.5 mg daily for the next 7 days.  Labs in 1 week to reevaluate kidney function and NT proBNP.  Given his renal function encouraged him the importance of repeat labs in a week.  Delando Satter [place], DO, [facility]

## 2021-08-28 NOTE — Telephone Encounter (Signed)
Reminder to call patient

## 2021-09-02 ENCOUNTER — Other Ambulatory Visit: Payer: Self-pay | Admitting: Cardiology

## 2021-09-02 ENCOUNTER — Other Ambulatory Visit (HOSPITAL_COMMUNITY): Payer: Self-pay

## 2021-09-02 ENCOUNTER — Telehealth: Payer: Self-pay | Admitting: Cardiology

## 2021-09-02 DIAGNOSIS — I5041 Acute combined systolic (congestive) and diastolic (congestive) heart failure: Secondary | ICD-10-CM

## 2021-09-02 MED ORDER — AZITHROMYCIN 250 MG PO TABS
250.0000 mg | ORAL_TABLET | ORAL | 0 refills | Status: DC
  Filled 2021-09-02: qty 6, 5d supply, fill #0

## 2021-09-02 NOTE — Telephone Encounter (Signed)
Patient called and advised that he is having a reaction to his medication . And only wants to speak to [facility] . Advised I could get him to medical assistant he refused . Called patient to advise him to hold medication that is causing the reaction . Please call patient at [phone] when available . Thank you

## 2021-09-02 NOTE — Telephone Encounter (Signed)
Spoke to the patient over the phone. His weight is now 206 pounds. He is having symptoms of nasal congestion/sinuses along with lightheadedness and dizziness with changing positions. He no longer has induration/" knot-like feeling" in his thighs/hamstrings or the abdominal wall. He has diuresed well.  His blood pressures are better controlled.  Symptoms are suggestive of orthostasis. Recommended holding Zaroxolyn going forward. Continue torsemide. He is due for labs this Friday. Further recommendations to follow.  Shandy Vi [place], DO, [facility]

## 2021-09-05 LAB — BASIC METABOLIC PANEL
BUN/Creatinine Ratio: 20 (ref 10–24)
BUN: 46 mg/dL — ABNORMAL HIGH (ref 8–27)
CO2: 22 mmol/L (ref 20–29)
Calcium: 9 mg/dL (ref 8.6–10.2)
Chloride: 103 mmol/L (ref 96–106)
Creatinine, Ser: 2.31 mg/dL — ABNORMAL HIGH (ref 0.76–1.27)
Glucose: 106 mg/dL — ABNORMAL HIGH (ref 70–99)
Potassium: 3.5 mmol/L (ref 3.5–5.2)
Sodium: 143 mmol/L (ref 134–144)
eGFR: 29 mL/min/{1.73_m2} — ABNORMAL LOW (ref >59)

## 2021-09-05 LAB — PRO B NATRIURETIC PEPTIDE: NT-Pro BNP: 6003 pg/mL — ABNORMAL HIGH (ref 0–486)

## 2021-09-05 LAB — MAGNESIUM: Magnesium: 2.5 mg/dL — ABNORMAL HIGH (ref 1.6–2.3)

## 2021-09-10 ENCOUNTER — Other Ambulatory Visit: Payer: Self-pay | Admitting: Cardiology

## 2021-09-10 DIAGNOSIS — I5042 Chronic combined systolic (congestive) and diastolic (congestive) heart failure: Secondary | ICD-10-CM

## 2021-09-10 DIAGNOSIS — I5041 Acute combined systolic (congestive) and diastolic (congestive) heart failure: Secondary | ICD-10-CM

## 2021-09-10 NOTE — Progress Notes (Signed)
Spoke to the patient over the phone. He is doing well. Still makes good urine. No longer taking metolazone as recommended during the last telephone encounter. His NT proBNP has improved he is lost weight and clinically feeling well. Given his fragile renal function will not be too aggressive with diuretics at the current time. Strict I's and O's and daily weights recommended We will check labs on 09/18/2021. He has an office visit on 09/22/2021.  Arlett Goold [place], DO, [facility]

## 2021-09-10 NOTE — Progress Notes (Signed)
Spoke to the patient over the phone. He is doing well. Still makes good urine. No longer taking metolazone as recommended during the last telephone encounter. His NT proBNP has improved he is lost weight and clinically feeling well. Given his fragile renal function will not be too aggressive with diuretics at the current time. Strict I's and O's and daily weights recommended We will check labs on 09/18/2021. He has an office visit on 09/22/2021.  Finola Rosal [place], DO, [facility]

## 2021-09-16 ENCOUNTER — Other Ambulatory Visit (HOSPITAL_COMMUNITY): Payer: Self-pay

## 2021-09-19 LAB — BASIC METABOLIC PANEL
BUN/Creatinine Ratio: 20 (ref 10–24)
BUN: 44 mg/dL — ABNORMAL HIGH (ref 8–27)
CO2: 21 mmol/L (ref 20–29)
Calcium: 8.7 mg/dL (ref 8.6–10.2)
Chloride: 106 mmol/L (ref 96–106)
Creatinine, Ser: 2.18 mg/dL — ABNORMAL HIGH (ref 0.76–1.27)
Glucose: 114 mg/dL — ABNORMAL HIGH (ref 70–99)
Potassium: 3.6 mmol/L (ref 3.5–5.2)
Sodium: 142 mmol/L (ref 134–144)
eGFR: 31 mL/min/{1.73_m2} — ABNORMAL LOW (ref >59)

## 2021-09-19 LAB — MAGNESIUM: Magnesium: 2.3 mg/dL (ref 1.6–2.3)

## 2021-09-19 LAB — PRO B NATRIURETIC PEPTIDE: NT-Pro BNP: 5985 pg/mL — ABNORMAL HIGH (ref 0–486)

## 2021-09-22 ENCOUNTER — Other Ambulatory Visit (HOSPITAL_COMMUNITY): Payer: Self-pay

## 2021-09-22 ENCOUNTER — Ambulatory Visit: Payer: Medicare Other | Admitting: Cardiology

## 2021-09-28 ENCOUNTER — Ambulatory Visit: Payer: Medicare Other | Admitting: Urology

## 2021-09-28 ENCOUNTER — Encounter: Payer: Self-pay | Admitting: Urology

## 2021-09-28 ENCOUNTER — Other Ambulatory Visit (HOSPITAL_COMMUNITY): Payer: Self-pay

## 2021-09-28 VITALS — BP 114/67 | HR 70

## 2021-09-28 DIAGNOSIS — R350 Frequency of micturition: Secondary | ICD-10-CM | POA: Diagnosis not present

## 2021-09-28 DIAGNOSIS — C61 Malignant neoplasm of prostate: Secondary | ICD-10-CM | POA: Diagnosis not present

## 2021-09-28 DIAGNOSIS — N138 Other obstructive and reflux uropathy: Secondary | ICD-10-CM

## 2021-09-28 DIAGNOSIS — N401 Enlarged prostate with lower urinary tract symptoms: Secondary | ICD-10-CM | POA: Diagnosis not present

## 2021-09-28 LAB — URINALYSIS, ROUTINE W REFLEX MICROSCOPIC
Bilirubin, UA: NEGATIVE
Ketones, UA: NEGATIVE
Leukocytes,UA: NEGATIVE
Nitrite, UA: POSITIVE — AB
Specific Gravity, UA: 1.015 (ref 1.005–1.030)
Urobilinogen, Ur: 0.2 mg/dL (ref 0.2–1.0)
pH, UA: 5 (ref 5.0–7.5)

## 2021-09-28 LAB — MICROSCOPIC EXAMINATION
RBC, Urine: >30 /hpf — AB (ref 0–2)
Renal Epithel, UA: NONE SEEN /hpf

## 2021-09-28 MED ORDER — DUTASTERIDE 0.5 MG PO CAPS
0.5000 mg | ORAL_CAPSULE | Freq: Every day | ORAL | 3 refills | Status: DC
  Filled 2021-09-28 – 2021-12-29 (×2): qty 90, 90d supply, fill #0

## 2021-09-28 NOTE — Patient Instructions (Signed)

## 2021-09-28 NOTE — Progress Notes (Signed)
09/28/2021 3:19 PM   Joel Duarte 10/05/1945 [id number]  Referring provider: Rogers Blocker, MD [place],   [id number]  Followup prostate cancer and BPH   HPI: Joel Duarte is a 76yo here for followup for prostate cancer and BPH. PSA decreased to 11.8 from 15. He is on dutasteride and cardura. IPSS 6 QOL 2.  He passed a small blood clot this morning.  No dysuria or hematuria. Urine stream strong. No straining to urinate. No other complaitns today   PMH: Past Medical History:  Diagnosis Date   Allergy    Enlarged prostate    Gout    Hyperlipidemia    Hypertension    Osteoarthrosis, unspecified whether generalized or localized, unspecified site    Seizures ([place])    history of 1 a year ago    Surgical History: Past Surgical History:  Procedure Laterality Date   no surgical h/o  June 2014    Home Medications:  Allergies as of 09/28/2021   No Known Allergies      Medication List        Accurate as of September 28, 2021  3:19 PM. If you have any questions, ask your nurse or doctor.          allopurinol 100 MG tablet Commonly known as: ZYLOPRIM Take 100 mg by mouth daily.   allopurinol 100 MG tablet Commonly known as: ZYLOPRIM Take 1 tablet (100 mg total) by mouth daily.   aspirin EC 81 MG tablet Take 1 tablet (81 mg total) by mouth daily. Swallow whole.   atorvastatin 40 MG tablet Commonly known as: LIPITOR Take 1 tablet (40 mg total) by mouth at bedtime.   azelastine 0.1 % nasal spray Commonly known as: ASTELIN USE ONE PUFF IN EACH NOSTRIL TWICE A DAY AS NEEDED   azithromycin 250 MG tablet Commonly known as: ZITHROMAX Take 2 tablets by mouth on day 1, then 1 tablet daily on days 2-5   carvedilol 25 MG tablet Commonly known as: COREG Take 1 tablet (25 mg total) by mouth 2 (two) times daily.   doxazosin 8 MG tablet Commonly known as: CARDURA Take 1 tablet (8 mg total) by mouth in the morning AND 0.5 tablets (4 mg  total) at bedtime.   dutasteride 0.5 MG capsule Commonly known as: AVODART Take 1 capsule (0.5 mg total) by mouth daily.   Farxiga 10 MG Tabs tablet Generic drug: dapagliflozin propanediol Take 1 tablet (10 mg total) by mouth daily before breakfast.   fluticasone 50 MCG/ACT nasal spray Commonly known as: FLONASE USE 1 SPRAY INTO EACH NOSTRIL TWO TIMES DAILY AS NEEDED   isosorbide-hydrALAZINE 20-37.5 MG tablet Commonly known as: BiDil Take 1 tablet by mouth 3 (three) times daily. What changed: when to take this   levETIRAcetam 750 MG tablet Commonly known as: KEPPRA Take 1 tablet (750 mg total) by mouth 2 (two) times daily.   metolazone 2.5 MG tablet Commonly known as: ZAROXOLYN Take 1 tablet (2.5 mg total) by mouth every Monday, Wednesday, and Friday for 30 doses.   omeprazole 40 MG capsule Commonly known as: PRILOSEC Take 1 capsule (40 mg total) by mouth daily.   torsemide 10 MG tablet Commonly known as: DEMADEX Take 1 tablet (10 mg total) by mouth every morning.        Allergies: No Known Allergies  Family History: Family History  Problem Relation Age of Onset   Hypertension Mother    Heart Problems Mother  Healthy Sister    Healthy Child     Social History:  reports that he has never smoked. He has never used smokeless tobacco. He reports that he does not currently use alcohol. He reports that he does not use drugs.  ROS: All other review of systems were reviewed and are negative except what is noted above in HPI  Physical Exam: BP 114/67   Pulse 70   Constitutional:  Alert and oriented, No acute distress. HEENT: [place] AT, moist mucus membranes.  Trachea midline, no masses. Cardiovascular: No clubbing, cyanosis, or edema. Respiratory: Normal respiratory effort, no increased work of breathing. GI: Abdomen is soft, nontender, nondistended, no abdominal masses GU: No CVA tenderness.  Lymph: No cervical or inguinal lymphadenopathy. Skin: No rashes,  bruises or suspicious lesions. Neurologic: Grossly intact, no focal deficits, moving all 4 extremities. Psychiatric: Normal mood and affect.  Laboratory Data: Lab Results  Component Value Date   WBC 10.6 (H) 08/06/2011   HGB 13.9 03/27/2013   HCT 41.0 03/27/2013   MCV 79.0 08/06/2011   PLT 158 08/06/2011    Lab Results  Component Value Date   CREATININE 2.18 (H) 09/18/2021    No results found for: "PSA"  No results found for: "TESTOSTERONE"  Lab Results  Component Value Date   HGBA1C 6.0 11/30/2011    Urinalysis    Component Value Date/Time   COLORURINE YELLOW 06/07/2011 0856   APPEARANCEUR Clear 04/01/2021 1506   LABSPEC 1.013 06/07/2011 0856   PHURINE 5.0 06/07/2011 0856   GLUCOSEU Negative 04/01/2021 1506   HGBUR TRACE (A) 06/07/2011 0856   BILIRUBINUR Negative 04/01/2021 1506   KETONESUR NEGATIVE 06/07/2011 0856   PROTEINUR 1+ (A) 04/01/2021 1506   PROTEINUR 30 (A) 06/07/2011 0856   UROBILINOGEN 0.2 06/07/2011 0856   NITRITE Negative 04/01/2021 1506   NITRITE NEGATIVE 06/07/2011 0856   LEUKOCYTESUR Negative 04/01/2021 1506    Lab Results  Component Value Date   LABMICR See below: 04/01/2021   WBCUA None seen 04/01/2021   LABEPIT None seen 04/01/2021   MUCUS Present 12/21/2019   BACTERIA None seen 04/01/2021    Pertinent Imaging:  No results found for this or any previous visit.  No results found for this or any previous visit.  No results found for this or any previous visit.  No results found for this or any previous visit.  Results for orders placed during the hospital encounter of 12/19/19  US RENAL  Narrative CLINICAL DATA:  Progressive chronic renal failure, obstructive Rob a thick, hypertension  EXAM: RENAL / URINARY TRACT ULTRASOUND COMPLETE  COMPARISON:  None  FINDINGS: Right Kidney:  Renal measurements: 8.4 x 3.3 x 4.4 cm = volume: 69 mL. Cortical thinning. Probably slightly increased cortical echogenicity. No mass,  hydronephrosis, or shadowing calcification.  Left Kidney:  Renal measurements: 11.3 x 6.4 x 5.5 cm = volume: 2 8 mL. Cortical thinning. Increased cortical echogenicity. Cyst at mid upper pole 3.2 x 3.1 x 4.1 cm, simple features. No additional mass, hydronephrosis, or shadowing calcification.  Bladder:  Appears normal for partial degree of bladder distention.  Other:  Enlarged prostate gland 4.9 x 4.7 x 7.0 cm (volume = 84 cm^3) extending into base of urinary bladder  IMPRESSION: Cortical thinning and probable medical renal disease changes of both kidneys.  4.1 cm LEFT renal cyst.  No evidence of additional renal mass or hydronephrosis.  Prostatomegaly.   Electronically Signed By: Lavonia Dana M.D. On: 12/20/2019 10:53  No results found for this  or any previous visit.  No results found for this or any previous visit.  No results found for this or any previous visit.   Assessment & Plan:    1. Prostate cancer ([place]) -RTC 6 months with PSA - Urinalysis, Routine w reflex microscopic  2. Benign prostatic hyperplasia with urinary obstruction -continue dutasteride  3. Urinary frequency -Improved with dutasteride.    No follow-ups on file.  Nicolette Bang, MD  [facility] [place]

## 2021-10-02 ENCOUNTER — Other Ambulatory Visit (HOSPITAL_COMMUNITY): Payer: Self-pay

## 2021-10-02 MED ORDER — AZITHROMYCIN 250 MG PO TABS
ORAL_TABLET | ORAL | 0 refills | Status: DC
  Filled 2021-10-02: qty 6, 5d supply, fill #0

## 2021-10-07 ENCOUNTER — Ambulatory Visit: Payer: Medicare Other | Admitting: Cardiology

## 2021-10-07 ENCOUNTER — Other Ambulatory Visit: Payer: Medicare Other

## 2021-10-07 ENCOUNTER — Encounter: Payer: Self-pay | Admitting: Cardiology

## 2021-10-07 ENCOUNTER — Other Ambulatory Visit (HOSPITAL_COMMUNITY): Payer: Self-pay

## 2021-10-07 VITALS — BP 158/89 | HR 64 | Temp 98.1°F | Resp 16 | Ht 74.0 in | Wt 219.6 lb

## 2021-10-07 DIAGNOSIS — G4731 Primary central sleep apnea: Secondary | ICD-10-CM

## 2021-10-07 DIAGNOSIS — C61 Malignant neoplasm of prostate: Secondary | ICD-10-CM

## 2021-10-07 DIAGNOSIS — R911 Solitary pulmonary nodule: Secondary | ICD-10-CM

## 2021-10-07 DIAGNOSIS — I459 Conduction disorder, unspecified: Secondary | ICD-10-CM

## 2021-10-07 DIAGNOSIS — R7303 Prediabetes: Secondary | ICD-10-CM

## 2021-10-07 DIAGNOSIS — I2510 Atherosclerotic heart disease of native coronary artery without angina pectoris: Secondary | ICD-10-CM

## 2021-10-07 DIAGNOSIS — E7841 Elevated Lipoprotein(a): Secondary | ICD-10-CM

## 2021-10-07 DIAGNOSIS — I5042 Chronic combined systolic (congestive) and diastolic (congestive) heart failure: Secondary | ICD-10-CM

## 2021-10-07 DIAGNOSIS — I429 Cardiomyopathy, unspecified: Secondary | ICD-10-CM

## 2021-10-07 DIAGNOSIS — E7800 Pure hypercholesterolemia, unspecified: Secondary | ICD-10-CM

## 2021-10-07 MED ORDER — HYDRALAZINE HCL 25 MG PO TABS
25.0000 mg | ORAL_TABLET | Freq: Three times a day (TID) | ORAL | 0 refills | Status: DC
  Filled 2021-10-07: qty 90, 30d supply, fill #0

## 2021-10-07 MED ORDER — APIXABAN 5 MG PO TABS
5.0000 mg | ORAL_TABLET | Freq: Two times a day (BID) | ORAL | 0 refills | Status: DC
  Filled 2021-10-07: qty 60, 30d supply, fill #0

## 2021-10-07 MED ORDER — ISOSORBIDE DINITRATE 10 MG PO TABS
10.0000 mg | ORAL_TABLET | Freq: Three times a day (TID) | ORAL | 0 refills | Status: DC
  Filled 2021-10-07: qty 90, 30d supply, fill #0

## 2021-10-07 NOTE — Progress Notes (Signed)
ID:  Joel Duarte, DOB [birth year], MRN [id number]  PCP:  Rogers Blocker, MD  Cardiologist:  Rex Kras, DO, [facility] (established care April 03, 2021)  Date: 10/07/21 Last Office Visit: 08/20/2021  Chief Complaint  Patient presents with   Congestive Heart Failure   Follow-up    4 weeks    HPI  Joel Duarte is a 76 y.o. African-American male whose past medical history and cardiovascular risk factors include: Cardiomyopathy, chronic combined systolic and diastolic heart failure, severe coronary artery calcification (total CAC 1130/93rd percentile), elevated LP(a), hypertension with chronic kidney disease stage 3b/4, erectile dysfunction, gout, history of seizures, central sleep apnea, prediabetes, prostate cancer, pulmonary nodules, advanced age.  Due to severe coronary calcification he did undergo ischemic work-up since establishing care.  His stress test in April 2023 was reported to be high risk due to reduced LVEF and dilated LV cavity but no definite evidence of reversible ischemia.  However given his progressive CKD and being asymptomatic (no anginal discomfort) shared decision was to monitor him clinically to prevent the risk of worsening renal function and likely dialysis.  However, if he developed anginal discomfort patient understands that he will need further testing and likely angiography which he is in agreement for.  He is asked to seek medical attention by going to the closest ER via EMS if he has any anginal discomfort.   Since then his GDMT has been uptitrated with close monitoring of renal function.  Since last office visit, he has lost approximately 3 pounds according to the office scale.  Home blood pressure and weights reviewed.  SBP is consistently greater than 130 mmHg and home weight has been as low as 209 pounds and it is currently 216 pounds.  He denies orthopnea, PND, and significant improvement in lower extremity swelling.  His dyspnea on exertion has improved significantly  since establishing care.    FUNCTIONAL STATUS: No structured exercise program or daily routine.   ALLERGIES: No Known Allergies  MEDICATION LIST PRIOR TO VISIT: Current Meds  Medication Sig   allopurinol (ZYLOPRIM) 100 MG tablet Take 1 tablet (100 mg total) by mouth daily.   apixaban (ELIQUIS) 5 MG TABS tablet Take 1 tablet (5 mg total) by mouth 2 (two) times daily.   aspirin EC 81 MG tablet Take 1 tablet (81 mg total) by mouth daily. Swallow whole.   atorvastatin (LIPITOR) 40 MG tablet Take 1 tablet (40 mg total) by mouth at bedtime.   azelastine (ASTELIN) 0.1 % nasal spray USE ONE PUFF IN EACH NOSTRIL TWICE A DAY AS NEEDED   azithromycin (ZITHROMAX) 250 MG tablet Take 2 tablets on day 1, then take 1 tablet daily for 4 days.   carvedilol (COREG) 25 MG tablet Take 1 tablet (25 mg total) by mouth 2 (two) times daily.   dapagliflozin propanediol (FARXIGA) 10 MG TABS tablet Take 1 tablet (10 mg total) by mouth daily before breakfast.   doxazosin (CARDURA) 8 MG tablet Take 1 tablet (8 mg total) by mouth in the morning AND 0.5 tablets (4 mg total) at bedtime.   dutasteride (AVODART) 0.5 MG capsule Take 1 capsule (0.5 mg total) by mouth daily.   hydrALAZINE (APRESOLINE) 25 MG tablet Take 1 tablet (25 mg total) by mouth 3 (three) times daily.   isosorbide dinitrate (ISORDIL) 10 MG tablet Take 1 tablet (10 mg total) by mouth 3 (three) times daily.   levETIRAcetam (KEPPRA) 750 MG tablet Take 1 tablet (750 mg total) by mouth 2 (two) times  daily.   torsemide (DEMADEX) 10 MG tablet Take 1 tablet (10 mg total) by mouth every morning.     PAST MEDICAL HISTORY: Past Medical History:  Diagnosis Date   Allergy    Enlarged prostate    Gout    Hyperlipidemia    Hypertension    Osteoarthrosis, unspecified whether generalized or localized, unspecified site    Seizures ([place])    history of 1 a year ago    PAST SURGICAL HISTORY: Past Surgical History:  Procedure Laterality Date   no surgical h/o   June 2014    FAMILY HISTORY: The patient family history includes Healthy in his child and sister; Heart Problems in his mother; Hypertension in his mother.  SOCIAL HISTORY:  The patient  reports that he has never smoked. He has never used smokeless tobacco. He reports that he does not currently use alcohol. He reports that he does not use drugs.  REVIEW OF SYSTEMS: Review of Systems  Constitutional: Positive for malaise/fatigue (improved) and weight loss.  Cardiovascular:  Positive for dyspnea on exertion. Negative for chest pain, leg swelling, orthopnea, palpitations, paroxysmal nocturnal dyspnea and syncope.  Respiratory:  Negative for shortness of breath.     PHYSICAL EXAM:    10/07/2021    9:01 AM 09/28/2021    2:44 PM 08/20/2021    3:17 PM  Vitals with BMI  Height 6' 2"   6' 2"   Weight 219 lbs 10 oz  222 lbs 6 oz  BMI 05.39  76.73  Systolic 419 379 024  Diastolic 89 67 83  Pulse 64 70 50    CONSTITUTIONAL: Well-developed and well-nourished. No acute distress.  SKIN: Skin is warm and dry. No rash noted. No cyanosis. No pallor. No jaundice HEAD: Normocephalic and atraumatic.  EYES: No scleral icterus MOUTH/THROAT: Moist oral membranes.  NECK: JVD present. No thyromegaly noted. No carotid bruits  CHEST Normal respiratory effort. No intercostal retractions  LUNGS: Clear to auscultation bilaterally.  No stridor. No wheezes. No rales.  CARDIOVASCULAR: Regular rate and rhythm, positive O9-B3, soft holosystolic murmur heard at the apex, no rubs or gallops appreciated. ABDOMINAL: Pitting edema (improved compared to last week), nontender, nondistended, positive bowel sounds all 4 quadrants. No apparent ascites.  EXTREMITIES: no bilateral peripheral edema, +1 edema extending to the lateral thighs and the abdominal wall, warm to touch, compression stockings present HEMATOLOGIC: No significant bruising NEUROLOGIC: Oriented to person, place, and time. Nonfocal. Normal muscle tone.   PSYCHIATRIC: Normal mood and affect. Normal behavior. Cooperative  CARDIAC DATABASE: EKG: 04/03/2021: NSR, 66 bpm, first-degree AV block, left axis, possible old anteroseptal infarct, nonspecific T wave changes.  10/07/2021: Probable atrial fibrillation, 66 bpm, left axis deviation, left anterior fascicular block, without underlying injury pattern.  Echocardiogram: 04/10/2021:  Left ventricle cavity is normal in size. Severe concentric hypertrophy of the left ventricle. Speckled pattern suggests infiltrative cardiomyopathy.  Hypokinetic global wall motion. Doppler evidence of grade I (impaired) diastolic dysfunction, normal LAP. Severely depressed LV systolic function with visual EF 25-30%. Calculated EF 30%.  Left atrial cavity is severely dilated at 5.2 cm. Right atrial cavity is mildly dilated.  Right ventricle cavity is normal in size. Mild concentric hypertrophy of the right ventricle. Severely reduced right ventricular function.  Trileaflet aortic valve.  Trace aortic regurgitation.  Structurally normal mitral valve.  Moderate (Grade II) mitral regurgitation.  Structurally normal tricuspid valve.  Moderate tricuspid regurgitation.  Severe pulmonary hypertension. RVSP measures 72 mmHg.  Pericardium is normal. Insignificant pericardial effusion.  IVC  is dilated with poor inspiration collapse consistent with elevated right atrial pressure.   Stress Testing: Exercise/Lexiscan Tetrofosmin stress test 05/06/2021: Exercise nuclear stress test was performed using Bruce protocol. Patient reached 5.6 METS, and 71% of age predicted maximum heart rate. Because of sub-maximal HR the patient was injected with 0.4 mg of Intravenous Lexiscan over 15 sec. Exercise capacity was low. No chest pain reported. Dyspnea and dizziness reported. Blunted heart rate response. Unable to comment on post exercise blood pressure, as reported blood pressure after stress is only after Lexiscan injection. Stress EKG at 71%  MPHR showed sinus rhythm, LAFB, occasional PVC, no ischemic changes.  Dilated LV cavity with moderate global decrease in myocardial wall motion and thickening. Stress LVEF 41%. SPECT images show small area of mild intensity, fixed perfusion defect in basal inferior myocardium. No definite evidence of ischemia. High risk study in the setting of dilated cardiomyopathy.   Heart Catheterization: None  CT Cardiac Scoring 04/13/2021 1. Coronary calcium score is 1130 and this is at percentile 93 for patients of the same age, gender and ethnicity.  2. Nonspecific patchy densities at the lung bases which could represent combination of chronic changes and/or atelectasis. Inaddition, there are small indeterminate nodule, largest measures 5 mm. No follow-up needed if patient is low-risk (and has no known or suspected primary neoplasm). Non-contrast chest CT can be considered in 12 months if patient is high-risk. This recommendation follows the consensus statement: Guidelines for Management of Incidental Pulmonary Nodules Detected on CT Images: From the Fleischner Society 2017; Radiology 2017; 284:228-243.  3. Sclerotic focus involving a thoracic vertebral body. This has benign characteristics but recommend correlation with PSA level based on history of prostate cancer.  4. Mild cardiomegaly.  LABORATORY DATA: External Labs: Collected: December 01, 2020 Sodium 144, potassium 4.9, chloride 108, bicarb 26. AST 17, ALT 12, alkaline phosphatase 82. Hemoglobin 12 g/dL, hematocrit 38.3%. BNP 362. NT proBNP 2741. Hemoglobin A1c 6  Collected: February 19, 2021. BUN 36, creatinine 2.19 mg/dL. eGFR 31 Sodium 144, potassium 4.5, chloride 109, bicarbonate 25. AST 16, ALT 13, alkaline phosphatase 81. PSA 15. NT proBNP 3304. Uric acid 7.7     Latest Ref Rng & Units 03/27/2013   10:12 AM 08/06/2011    4:57 AM 06/08/2011    5:32 AM  CBC  WBC 4.0 - 10.5 K/uL  10.6  7.7   Hemoglobin 13.0 - 17.0 g/dL 13.9   12.5  10.8   Hematocrit 39.0 - 52.0 % 41.0  37.7  32.9   Platelets 150 - 400 K/uL  158  151        Latest Ref Rng & Units 09/18/2021    9:21 AM 09/04/2021    8:15 AM 08/26/2021    8:13 AM  CMP  Glucose 70 - 99 mg/dL 114  106  115   BUN 8 - 27 mg/dL 44  46  39   Creatinine 0.76 - 1.27 mg/dL 2.18  2.31  2.00   Sodium 134 - 144 mmol/L 142  143  143   Potassium 3.5 - 5.2 mmol/L 3.6  3.5  3.7   Chloride 96 - 106 mmol/L 106  103  103   CO2 20 - 29 mmol/L 21  22  20    Calcium 8.6 - 10.2 mg/dL 8.7  9.0  9.3     Lipid Panel     Component Value Date/Time   CHOL 128 06/29/2021 0925   TRIG 61 06/29/2021 0925   HDL 49 06/29/2021 0925  LDLCALC 66 06/29/2021 0925   LDLDIRECT 117 (H) 04/08/2021 0818   LABVLDL 13 06/29/2021 0925    No components found for: "NTPROBNP" Recent Labs    04/08/21 0818 04/27/21 0900 05/20/21 0912 08/11/21 0841 08/19/21 0912 08/26/21 0813 09/04/21 0815 09/18/21 0921  PROBNP 4,300* 4,660* 8,074* 8,349* 7,500* 7,398* 6,003* 5,985*   No results for input(s): "TSH" in the last 8760 hours.  BMP Recent Labs    08/26/21 0813 09/04/21 0815 09/18/21 0921  NA 143 143 142  K 3.7 3.5 3.6  CL 103 103 106  CO2 20 22 21   GLUCOSE 115* 106* 114*  BUN 39* 46* 44*  CREATININE 2.00* 2.31* 2.18*  CALCIUM 9.3 9.0 8.7    HEMOGLOBIN A1C Lab Results  Component Value Date   HGBA1C 6.0 11/30/2011   MPG 134 (H) 06/07/2011    Cardiac Panel (last 3 results) No results for input(s): "CKTOTAL", "CKMB", "TROPONINIHS", "RELINDX" in the last 72 hours.  CHOLESTEROL Recent Labs    04/08/21 0818 06/29/21 0925  CHOL 178 128    Hepatic Function Panel Recent Labs    04/08/21 0818 06/29/21 0925  PROT 7.3 6.7  ALBUMIN 4.2 3.9  AST 27 31  ALT 19 26  ALKPHOS 104 102  BILITOT 1.8* 2.0*  BILIDIR  --  0.52*   IMPRESSION:    ICD-10-CM   1. Chronic combined systolic and diastolic heart failure (HCC)  I50.42 EKG 12-Lead    LONG TERM MONITOR (3-14 DAYS)     hydrALAZINE (APRESOLINE) 25 MG tablet    isosorbide dinitrate (ISORDIL) 10 MG tablet    apixaban (ELIQUIS) 5 MG TABS tablet    2. Cardiomyopathy, unspecified type (Normal)  I42.9     3. Conduction disorder of the heart  I45.9     4. Coronary atherosclerosis due to calcified coronary lesion  I25.10    I25.84     5. Hypercholesteremia  E78.00     6. Elevated Lp(a)  E78.41     7. Prediabetes  R73.03     8. Central sleep apnea  G47.31     9. Prostate cancer ([place])  C61     10. Pulmonary nodule  R91.1        RECOMMENDATIONS: Keyandre Pileggi is a 76 y.o. African-American male whose past medical history and cardiac risk factors include: Cardiomyopathy, chronic combined systolic and diastolic heart failure, severe coronary artery calcification (total CAC 1130/93rd percentile), elevated LP(a), hypertension with chronic kidney disease stage IIIb, erectile dysfunction, gout, history of seizures, central sleep apnea, prediabetes, prostate cancer, pulmonary nodules, advanced age.  Chronic combined systolic and diastolic heart failure (HCC) / Cardiomyopathy, unspecified type ([place]) Stage B, NYHA class II. Has diuresed well with oral medications, since July 2023. Up titration of GDMT has been difficult either due to renal function or patient's symptoms. In the past Entresto was discontinued due to worsening renal function, spironolactone discontinued secondary to him feeling tired, fatigued, worn out. He was started on BiDil 20/37.5 he is currently taking half a tablet twice a day as opposed to 1 tablet tid.  I have asked him to stop taking the BiDil and will transition him to isosorbide dinitrate/hydralazine. Denies angina pectoris. EKG today concerning for possible atrial fibrillation.  This is a new diagnosis for him.  However, recommend further investigation with a Zio patch.  Given his risk factors, reduced LVEF, and now possible A-fib he is at high risk of thromboembolic event.  We will  prophylactically start him on Eliquis 5  mg p.o. twice daily until his work-up is complete.  Discussed the risks, benefits, and alternatives to oral anticoagulation.  He verbalizes understanding.  Once he is euvolemic and his conduction disease is better defined will start the workup for possible infiltrative cardiomyopathy.  Coronary atherosclerosis due to calcified coronary lesion Total CAC 1130, 93rd percentile. Continue aspirin and statin therapy. In the past his MPI was reported to be high risk due to reduced LVEF but no overt reversible ischemia.  However given his renal function shared decision was to hold off on angiography or coronary CTA to prevent contrast-induced nephropathy/progression of his renal function especially when he is asymptomatic. He denies anginal discomfort. EKG is nonischemic   Hypercholesteremia / Elevated Lp(a) Currently on atorvastatin.   Initial LDL 117 mg/dL in the last LP(a) 104.9 With up titration of statin therapy his most recent LDL was 66 mg/dL.  Central sleep apnea Reemphasized the importance of CPAP compliance  Prostate cancer [facility]) We will defer management to urology/PCP.  Pulmonary nodule Defer management to PCP.  FINAL MEDICATION LIST END OF ENCOUNTER: Meds ordered this encounter  Medications   hydrALAZINE (APRESOLINE) 25 MG tablet    Sig: Take 1 tablet (25 mg total) by mouth 3 (three) times daily.    Dispense:  90 tablet    Refill:  0   isosorbide dinitrate (ISORDIL) 10 MG tablet    Sig: Take 1 tablet (10 mg total) by mouth 3 (three) times daily.    Dispense:  90 tablet    Refill:  0   apixaban (ELIQUIS) 5 MG TABS tablet    Sig: Take 1 tablet (5 mg total) by mouth 2 (two) times daily.    Dispense:  60 tablet    Refill:  0     Current Outpatient Medications:    allopurinol (ZYLOPRIM) 100 MG tablet, Take 1 tablet (100 mg total) by mouth daily., Disp: 90 tablet, Rfl: 2   apixaban (ELIQUIS) 5 MG TABS tablet, Take 1 tablet (5 mg total)  by mouth 2 (two) times daily., Disp: 60 tablet, Rfl: 0   aspirin EC 81 MG tablet, Take 1 tablet (81 mg total) by mouth daily. Swallow whole., Disp: 30 tablet, Rfl: 11   atorvastatin (LIPITOR) 40 MG tablet, Take 1 tablet (40 mg total) by mouth at bedtime., Disp: 90 tablet, Rfl: 0   azelastine (ASTELIN) 0.1 % nasal spray, USE ONE PUFF IN EACH NOSTRIL TWICE A DAY AS NEEDED, Disp: 30 mL, Rfl: 6   azithromycin (ZITHROMAX) 250 MG tablet, Take 2 tablets on day 1, then take 1 tablet daily for 4 days., Disp: 6 tablet, Rfl: 0   carvedilol (COREG) 25 MG tablet, Take 1 tablet (25 mg total) by mouth 2 (two) times daily., Disp: 180 tablet, Rfl: 2   dapagliflozin propanediol (FARXIGA) 10 MG TABS tablet, Take 1 tablet (10 mg total) by mouth daily before breakfast., Disp: 90 tablet, Rfl: 0   doxazosin (CARDURA) 8 MG tablet, Take 1 tablet (8 mg total) by mouth in the morning AND 0.5 tablets (4 mg total) at bedtime., Disp: 135 tablet, Rfl: 2   dutasteride (AVODART) 0.5 MG capsule, Take 1 capsule (0.5 mg total) by mouth daily., Disp: 90 capsule, Rfl: 3   hydrALAZINE (APRESOLINE) 25 MG tablet, Take 1 tablet (25 mg total) by mouth 3 (three) times daily., Disp: 90 tablet, Rfl: 0   isosorbide dinitrate (ISORDIL) 10 MG tablet, Take 1 tablet (10 mg total) by mouth 3 (three) times daily., Disp: 90 tablet, Rfl:  0   levETIRAcetam (KEPPRA) 750 MG tablet, Take 1 tablet (750 mg total) by mouth 2 (two) times daily., Disp: 180 tablet, Rfl: 3   torsemide (DEMADEX) 10 MG tablet, Take 1 tablet (10 mg total) by mouth every morning., Disp: 90 tablet, Rfl: 0   fluticasone (FLONASE) 50 MCG/ACT nasal spray, USE 1 SPRAY INTO EACH NOSTRIL TWO TIMES DAILY AS NEEDED, Disp: 16 g, Rfl: 5   metolazone (ZAROXOLYN) 2.5 MG tablet, Take 1 tablet (2.5 mg total) by mouth every Monday, Wednesday, and Friday for 30 doses., Disp: 30 tablet, Rfl: 0  Orders Placed This Encounter  Procedures   LONG TERM MONITOR (3-14 DAYS)   EKG 12-Lead    There are no  Patient Instructions on file for this visit.   --Continue cardiac medications as reconciled in final medication list. --Return in about 5 weeks (around 11/11/2021) for Follow up, heart failure management., Review test results. Or sooner if needed. --Continue follow-up with your primary care physician regarding the management of your other chronic comorbid conditions.  Patient's questions and concerns were addressed to his satisfaction. He voices understanding of the instructions provided during this encounter.   This note was created using a voice recognition software as a result there may be grammatical errors inadvertently enclosed that do not reflect the nature of this encounter. Every attempt is made to correct such errors.  Rex Kras, [place], [facility]  Pager: [phone] Office: [phone]

## 2021-10-09 ENCOUNTER — Other Ambulatory Visit (HOSPITAL_COMMUNITY): Payer: Self-pay

## 2021-10-12 ENCOUNTER — Other Ambulatory Visit: Payer: Self-pay

## 2021-10-12 ENCOUNTER — Other Ambulatory Visit (HOSPITAL_COMMUNITY): Payer: Self-pay

## 2021-10-12 DIAGNOSIS — I5042 Chronic combined systolic (congestive) and diastolic (congestive) heart failure: Secondary | ICD-10-CM

## 2021-10-12 MED ORDER — APIXABAN 5 MG PO TABS: 5.0000 mg | ORAL_TABLET | Freq: Two times a day (BID) | ORAL | 3 refills | Status: DC

## 2021-10-20 ENCOUNTER — Other Ambulatory Visit: Payer: Self-pay | Admitting: Cardiology

## 2021-10-20 DIAGNOSIS — E7800 Pure hypercholesterolemia, unspecified: Secondary | ICD-10-CM

## 2021-10-21 ENCOUNTER — Other Ambulatory Visit (HOSPITAL_COMMUNITY): Payer: Self-pay

## 2021-10-21 MED ORDER — ATORVASTATIN CALCIUM 40 MG PO TABS
40.0000 mg | ORAL_TABLET | Freq: Every day | ORAL | 0 refills | Status: AC
  Filled 2021-10-21: qty 90, 90d supply, fill #0

## 2021-10-26 ENCOUNTER — Other Ambulatory Visit (HOSPITAL_COMMUNITY): Payer: Self-pay

## 2021-10-26 MED ORDER — BENZONATATE 100 MG PO CAPS
100.0000 mg | ORAL_CAPSULE | Freq: Three times a day (TID) | ORAL | 1 refills | Status: DC | PRN
  Filled 2021-10-26: qty 50, 9d supply, fill #0

## 2021-10-30 ENCOUNTER — Other Ambulatory Visit (HOSPITAL_COMMUNITY): Payer: Self-pay

## 2021-10-30 MED ORDER — AZELASTINE HCL 0.1 % NA SOLN
2.0000 | Freq: Two times a day (BID) | NASAL | 11 refills | Status: DC
  Filled 2021-10-30: qty 30, 25d supply, fill #0

## 2021-11-04 NOTE — Progress Notes (Signed)
Assessment/Plan:   1. 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  -Patient will continue his Keppra, 750 mg twice per day.  2.  Hypertension with renal insufficiency  -Discussed importance of controlling blood pressure.  -Patient's creatinine clearance currently 34.  Okay to continue this dose of Keppra.  May need to renally dose in the future.  3.  Hand paresthesias  -likely entrapment neuropathy, perhaps CTS  -discussed cock up wrist splints  -He was think about EMG, but does not want to schedule that right now.  Well and he changes his mind before next visit.  4.  Congestive heart failure with cardiomyopathy and possible A-fib  -Following with cardiology  5.  F/u 1 year   Subjective:   Joel Duarte was seen today in follow up for seizure/frontal lobe epilepsy.  My previous records as well as any outside records available were reviewed prior to todays visit.  He is doing well on his Keppra.  He is compliant with medications and is seizure-free.  He has been following with cardiology fairly frequently since our last visit.  He was diagnosed with systolic and diastolic congestive heart failure and cardiomyopathy.  In fact, back in July, elective hospital admission was recommended, but declined and he was managed as an outpatient.  He was last seen by cardiology on September 20.  He has diuresed well and is doing much better.  When he was last seen, they were concerned for A-fib and a Zio patch was recommended.  He had that completed.  The formal read is not avail yet.     PREVIOUS MEDICATIONS: Dilantin (headache)  CURRENT MEDICATIONS:  Outpatient Encounter Medications as of 11/10/2021  Medication Sig   allopurinol (ZYLOPRIM) 100 MG tablet Take 1 tablet (100 mg total) by mouth daily.   apixaban (ELIQUIS) 5 MG TABS tablet Take 1 tablet (5 mg total) by mouth 2 (two) times daily.   aspirin EC 81 MG tablet Take 1 tablet (81 mg total) by mouth daily. Swallow whole.    atorvastatin (LIPITOR) 40 MG tablet Take 1 tablet (40 mg total) by mouth at bedtime.   carvedilol (COREG) 25 MG tablet Take 1 tablet (25 mg total) by mouth 2 (two) times daily.   dapagliflozin propanediol (FARXIGA) 10 MG TABS tablet Take by mouth daily.   doxazosin (CARDURA) 8 MG tablet Take 1 tablet (8 mg total) by mouth in the morning AND 0.5 tablets (4 mg total) at bedtime.   dutasteride (AVODART) 0.5 MG capsule Take 1 capsule (0.5 mg total) by mouth daily.   hydrALAZINE (APRESOLINE) 25 MG tablet Take 1 tablet (25 mg total) by mouth 3 (three) times daily.   isosorbide dinitrate (ISORDIL) 10 MG tablet Take 1 tablet (10 mg total) by mouth 3 (three) times daily.   torsemide (DEMADEX) 10 MG tablet Take 1 tablet (10 mg total) by mouth every morning.   azelastine (ASTELIN) 0.1 % nasal spray USE ONE PUFF IN EACH NOSTRIL TWICE A DAY AS NEEDED   azelastine (ASTELIN) 0.1 % nasal spray Place 2 sprays into both nostrils 2 (two) times daily. (Patient not taking: Reported on 11/10/2021)   azithromycin (ZITHROMAX) 250 MG tablet Take 2 tablets on day 1, then take 1 tablet daily for 4 days. (Patient not taking: Reported on 11/10/2021)   benzonatate (TESSALON) 100 MG capsule Take 1-2 capsules (100-200 mg total) by mouth 3 (three) times daily as needed for cough (Patient not taking: Reported on 11/10/2021)   [EXPIRED] dapagliflozin propanediol (FARXIGA) 10 MG TABS  tablet Take 1 tablet (10 mg total) by mouth daily before breakfast.   fluticasone (FLONASE) 50 MCG/ACT nasal spray USE 1 SPRAY INTO EACH NOSTRIL TWO TIMES DAILY AS NEEDED   levETIRAcetam (KEPPRA) 750 MG tablet Take 1 tablet (750 mg total) by mouth 2 (two) times daily.   metolazone (ZAROXOLYN) 2.5 MG tablet Take 1 tablet (2.5 mg total) by mouth every Monday, Wednesday, and Friday for 30 doses.   [DISCONTINUED] benazepril (LOTENSIN) 40 MG tablet TAKE 1 TABLET BY MOUTH DAILY   [DISCONTINUED] NIFEdipine (PROCARDIA) 10 MG capsule Take 1 capsule (10 mg total)  by mouth 2 (two) times daily.   No facility-administered encounter medications on file as of 11/10/2021.     Objective:   PHYSICAL EXAMINATION:    VITALS:   Vitals:   11/10/21 1436  BP: 123/82  Pulse: 68  SpO2: 99%  Weight: 217 lb 6.4 oz (98.6 kg)  Height: '6\' 3"'$  (1.905 m)      GEN:  The patient appears stated age and is in NAD. HEENT:  Normocephalic, atraumatic.  The mucous membranes are moist. The superficial temporal arteries are without ropiness or tenderness. CV:  RRR Lungs:  CTAB Neck/HEME:  There are no carotid bruits bilaterally.  Neurological examination:  Orientation: The patient is alert and oriented x3. Cranial nerves: There is good facial symmetry.extraocular muscles are intact, without nystagmus.  The speech is fluent and clear. Soft palate rises symmetrically and there is no tongue deviation. Hearing is intact to conversational tone. Sensation: Sensation is intact to light touch throughout Motor: Strength is 5/5 in the bilateral upper and lower extremities.  There is no pronator drift.  Movement examination: Tone: There is normal tone in the UE/LE Abnormal movements:  no tremor.  No myoclonus.  No asterixis.   Coordination:  There is no decremation with RAM's. Gait and Station: The patient has no difficulty arising out of a deep-seated chair without the use of the hands. The patient's stride length is good.  He is able to ambulate in a tandem fashion.  He is able to stand in Romberg position.      Cc:  Rogers Blocker, MD

## 2021-11-10 ENCOUNTER — Ambulatory Visit: Payer: Medicare Other | Admitting: Neurology

## 2021-11-10 ENCOUNTER — Other Ambulatory Visit (HOSPITAL_COMMUNITY): Payer: Self-pay

## 2021-11-10 ENCOUNTER — Encounter: Payer: Self-pay | Admitting: Neurology

## 2021-11-10 VITALS — BP 123/82 | HR 68 | Ht 75.0 in | Wt 217.4 lb

## 2021-11-10 DIAGNOSIS — G40109 Localization-related (focal) (partial) symptomatic epilepsy and epileptic syndromes with simple partial seizures, not intractable, without status epilepticus: Secondary | ICD-10-CM

## 2021-11-10 MED ORDER — LEVETIRACETAM 750 MG PO TABS
750.0000 mg | ORAL_TABLET | Freq: Two times a day (BID) | ORAL | 3 refills | Status: DC
  Filled 2021-11-10: qty 180, 90d supply, fill #0

## 2021-11-10 NOTE — Patient Instructions (Signed)
The physicians and staff at [facility] are committed to providing excellent care. You may receive a survey requesting feedback about your experience at our office. We strive to receive "very good" responses to the survey questions. If you feel that your experience would prevent you from giving the office a "very good " response, please contact our office to try to remedy the situation. We may be reached at [phone]. Thank you for taking the time out of your busy day to complete the survey.  

## 2021-11-11 ENCOUNTER — Other Ambulatory Visit: Payer: Self-pay | Admitting: Cardiology

## 2021-11-11 ENCOUNTER — Other Ambulatory Visit (HOSPITAL_COMMUNITY): Payer: Self-pay

## 2021-11-11 ENCOUNTER — Ambulatory Visit: Payer: Medicare Other | Admitting: Cardiology

## 2021-11-11 ENCOUNTER — Encounter: Payer: Self-pay | Admitting: Cardiology

## 2021-11-11 VITALS — BP 145/87 | HR 74 | Temp 97.7°F | Resp 16 | Ht 75.0 in | Wt 220.0 lb

## 2021-11-11 DIAGNOSIS — E7841 Elevated Lipoprotein(a): Secondary | ICD-10-CM

## 2021-11-11 DIAGNOSIS — I2510 Atherosclerotic heart disease of native coronary artery without angina pectoris: Secondary | ICD-10-CM

## 2021-11-11 DIAGNOSIS — I5042 Chronic combined systolic (congestive) and diastolic (congestive) heart failure: Secondary | ICD-10-CM

## 2021-11-11 DIAGNOSIS — E7800 Pure hypercholesterolemia, unspecified: Secondary | ICD-10-CM

## 2021-11-11 DIAGNOSIS — C61 Malignant neoplasm of prostate: Secondary | ICD-10-CM

## 2021-11-11 DIAGNOSIS — G4731 Primary central sleep apnea: Secondary | ICD-10-CM

## 2021-11-11 DIAGNOSIS — R911 Solitary pulmonary nodule: Secondary | ICD-10-CM

## 2021-11-11 DIAGNOSIS — I429 Cardiomyopathy, unspecified: Secondary | ICD-10-CM

## 2021-11-11 MED ORDER — HYDRALAZINE HCL 25 MG PO TABS
25.0000 mg | ORAL_TABLET | Freq: Three times a day (TID) | ORAL | 1 refills | Status: DC
  Filled 2021-11-11: qty 90, 30d supply, fill #0

## 2021-11-11 NOTE — Progress Notes (Signed)
ID:  Joel Duarte, DOB [birth year], MRN [id number]  PCP:  Rogers Blocker, MD  Cardiologist:  Rex Kras, DO, [facility] (established care April 03, 2021)  Date: 11/11/21 Last Office Visit: 08/20/2021  Chief Complaint  Patient presents with   heart failure management   Results   Follow-up    HPI  Joel Duarte is a 76 y.o. African-American male whose past medical history and cardiovascular risk factors include: Cardiomyopathy, chronic combined systolic and diastolic heart failure, severe coronary artery calcification (total CAC 1130/93rd percentile), elevated LP(a), hypertension with chronic kidney disease stage 3b/4, erectile dysfunction, gout, history of seizures, central sleep apnea, prediabetes, prostate cancer, pulmonary nodules, advanced age.  Due to his severe coronary artery calcification he did undergo ischemic work-up which included a stress test in April 2023.  His stress test was reported to be high risk due to reduced LVEF, dilated LV cavity, but no definitive evidence of reversible ischemia.  Given his progression of CKD and being asymptomatic (no anginal discomfort) shared decision was to monitor him clinically to prevent the risk of worsening renal function leading to dialysis.  However, on multiple office visits he has been educated that if he develops chest pain concerning for anginal discomfort he needs to seek medical attention by going to the closest ER via EMS.  If he has symptoms of ACS angiography is warranted irrespective of the renal function.  Over the last several office visits we have uptitrated GDMT to help maintain euvolemia &  blood pressure with an attempt to preserve as much renal function as possible. Patient's home weight ranges between 209-215.  Today he was 211 pounds at home.  At the last office visit he was 216 pounds.  He has not been requiring Zaroxolyn.  He only uses Zaroxolyn as a as needed basis if his weight is more than 220 pounds.  At the last office visit  EKG was concerning for atrial fibrillation and he did undergo a Zio patch.  Zio patch did not document any evidence of A-fib.  And I reviewed the prior EKG with my partners and the underlying rhythm appears to be sinus with atrial bigeminy.  Of note, patient stopped taking Eliquis as it was cost prohibitive.  At last office visit he was started on isosorbide dinitrate and hydralazine and was recommended to discontinue BiDil.  However he continues to have episodes of difficulty focusing and headaches intermittently.  FUNCTIONAL STATUS: No structured exercise program or daily routine.   ALLERGIES: No Known Allergies  MEDICATION LIST PRIOR TO VISIT: Current Meds  Medication Sig   allopurinol (ZYLOPRIM) 100 MG tablet Take 1 tablet (100 mg total) by mouth daily.   aspirin EC 81 MG tablet Take 1 tablet (81 mg total) by mouth daily. Swallow whole.   atorvastatin (LIPITOR) 40 MG tablet Take 1 tablet (40 mg total) by mouth at bedtime.   azelastine (ASTELIN) 0.1 % nasal spray USE ONE PUFF IN EACH NOSTRIL TWICE A DAY AS NEEDED   azelastine (ASTELIN) 0.1 % nasal spray Place 2 sprays into both nostrils 2 (two) times daily.   benzonatate (TESSALON) 100 MG capsule Take 1-2 capsules (100-200 mg total) by mouth 3 (three) times daily as needed for cough   carvedilol (COREG) 25 MG tablet Take 1 tablet (25 mg total) by mouth 2 (two) times daily.   dapagliflozin propanediol (FARXIGA) 10 MG TABS tablet Take by mouth daily.   doxazosin (CARDURA) 8 MG tablet Take 1 tablet (8 mg total) by mouth in  the morning AND 0.5 tablets (4 mg total) at bedtime.   dutasteride (AVODART) 0.5 MG capsule Take 1 capsule (0.5 mg total) by mouth daily.   fluticasone (FLONASE) 50 MCG/ACT nasal spray USE 1 SPRAY INTO EACH NOSTRIL TWO TIMES DAILY AS NEEDED   levETIRAcetam (KEPPRA) 750 MG tablet Take 1 tablet (750 mg total) by mouth 2 (two) times daily.   omeprazole (PRILOSEC) 40 MG capsule Take 40 mg by mouth daily.   torsemide (DEMADEX)  10 MG tablet Take 1 tablet (10 mg total) by mouth every morning.   [DISCONTINUED] hydrALAZINE (APRESOLINE) 25 MG tablet Take 1 tablet (25 mg total) by mouth 3 (three) times daily.   [DISCONTINUED] isosorbide dinitrate (ISORDIL) 10 MG tablet Take 1 tablet (10 mg total) by mouth 3 (three) times daily.     PAST MEDICAL HISTORY: Past Medical History:  Diagnosis Date   Allergy    Enlarged prostate    Gout    Hyperlipidemia    Hypertension    Osteoarthrosis, unspecified whether generalized or localized, unspecified site    Seizures ([place])    history of 1 a year ago    PAST SURGICAL HISTORY: Past Surgical History:  Procedure Laterality Date   no surgical h/o  June 2014    FAMILY HISTORY: The patient family history includes Healthy in his child and sister; Heart Problems in his mother; Hypertension in his mother.  SOCIAL HISTORY:  The patient  reports that he has never smoked. He has never used smokeless tobacco. He reports that he does not currently use alcohol. He reports that he does not use drugs.  REVIEW OF SYSTEMS: Review of Systems  Constitutional: Positive for malaise/fatigue (improved) and weight loss.  Cardiovascular:  Positive for dyspnea on exertion (improved). Negative for chest pain, leg swelling, orthopnea, palpitations, paroxysmal nocturnal dyspnea and syncope.  Respiratory:  Negative for shortness of breath.     PHYSICAL EXAM:    11/11/2021   10:15 AM 11/10/2021    2:36 PM 10/07/2021    9:01 AM  Vitals with BMI  Height 6' 3"  6' 3"  6' 2"   Weight 220 lbs 217 lbs 6 oz 219 lbs 10 oz  BMI 27.5 40.97 35.32  Systolic 992 426 834  Diastolic 87 82 89  Pulse 74 68 64    CONSTITUTIONAL: Well-developed and well-nourished. No acute distress.  SKIN: Skin is warm and dry. No rash noted. No cyanosis. No pallor. No jaundice HEAD: Normocephalic and atraumatic.  EYES: No scleral icterus MOUTH/THROAT: Moist oral membranes.  NECK: JVD present. No thyromegaly noted. No  carotid bruits  CHEST Normal respiratory effort. No intercostal retractions  LUNGS: Clear to auscultation bilaterally.  No stridor. No wheezes. No rales.  CARDIOVASCULAR: Regular rate and rhythm, positive H9-Q2, soft holosystolic murmur heard at the apex, no rubs or gallops appreciated. ABDOMINAL:  nontender, nondistended, positive bowel sounds all 4 quadrants. No apparent ascites.  EXTREMITIES: no bilateral peripheral edema, + bilateral trace edema, compression stockings present HEMATOLOGIC: No significant bruising NEUROLOGIC: Oriented to person, place, and time. Nonfocal. Normal muscle tone.  PSYCHIATRIC: Normal mood and affect. Normal behavior. Cooperative  CARDIAC DATABASE: EKG: 04/03/2021: NSR, 66 bpm, first-degree AV block, left axis, possible old anteroseptal infarct, nonspecific T wave changes.  10/07/2021: Probable sinus rhythm with first-degree AV block, atrial bigeminy, left axis, left anterior fascicular block, IVCD, nonspecific T wave abnormality.  Echocardiogram: 04/10/2021:  Left ventricle cavity is normal in size. Severe concentric hypertrophy of the left ventricle. Speckled pattern suggests infiltrative cardiomyopathy.  Hypokinetic global wall motion. Doppler evidence of grade I (impaired) diastolic dysfunction, normal LAP. Severely depressed LV systolic function with visual EF 25-30%. Calculated EF 30%.  Left atrial cavity is severely dilated at 5.2 cm. Right atrial cavity is mildly dilated.  Right ventricle cavity is normal in size. Mild concentric hypertrophy of the right ventricle. Severely reduced right ventricular function.  Trileaflet aortic valve.  Trace aortic regurgitation.  Structurally normal mitral valve.  Moderate (Grade II) mitral regurgitation.  Structurally normal tricuspid valve.  Moderate tricuspid regurgitation.  Severe pulmonary hypertension. RVSP measures 72 mmHg.  Pericardium is normal. Insignificant pericardial effusion.  IVC is dilated with poor  inspiration collapse consistent with elevated right atrial pressure.   Stress Testing: Exercise/Lexiscan Tetrofosmin stress test 05/06/2021: Exercise nuclear stress test was performed using Bruce protocol. Patient reached 5.6 METS, and 71% of age predicted maximum heart rate. Because of sub-maximal HR the patient was injected with 0.4 mg of Intravenous Lexiscan over 15 sec. Exercise capacity was low. No chest pain reported. Dyspnea and dizziness reported. Blunted heart rate response. Unable to comment on post exercise blood pressure, as reported blood pressure after stress is only after Lexiscan injection. Stress EKG at 71% MPHR showed sinus rhythm, LAFB, occasional PVC, no ischemic changes.  Dilated LV cavity with moderate global decrease in myocardial wall motion and thickening. Stress LVEF 41%. SPECT images show small area of mild intensity, fixed perfusion defect in basal inferior myocardium. No definite evidence of ischemia. High risk study in the setting of dilated cardiomyopathy.   Heart Catheterization: None  CT Cardiac Scoring 04/13/2021 1. Coronary calcium score is 1130 and this is at percentile 93 for patients of the same age, gender and ethnicity.  2. Nonspecific patchy densities at the lung bases which could represent combination of chronic changes and/or atelectasis. Inaddition, there are small indeterminate nodule, largest measures 5 mm. No follow-up needed if patient is low-risk (and has no known or suspected primary neoplasm). Non-contrast chest CT can be considered in 12 months if patient is high-risk. This recommendation follows the consensus statement: Guidelines for Management of Incidental Pulmonary Nodules Detected on CT Images: From the Fleischner Society 2017; Radiology 2017; 284:228-243.  3. Sclerotic focus involving a thoracic vertebral body. This has benign characteristics but recommend correlation with PSA level based on history of prostate cancer.  4. Mild  cardiomegaly.  Cardiac monitor (Zio Patch): 10/07/2021 - 10/21/2021 Dominant rhythm normal sinus rhythm, first-degree AV block, IVCD, followed by bradycardia burden (22%). Heart rate 33-197 bpm.  Avg HR 55 bpm. No atrial fibrillation, high grade AV block, pauses (3 seconds or longer). Total ventricular ectopic burden <1%. Episodes of atrial tachycardia with variable complete.   Total supraventricular ectopic burden 19.8% (predominantly isolated beats). Episodes of asymptomatic NSVT - the fastest interval of 15.8 seconds with a max HR of 197bpm.  Patient triggered events: 0.  LABORATORY DATA: External Labs: Collected: December 01, 2020 Sodium 144, potassium 4.9, chloride 108, bicarb 26. AST 17, ALT 12, alkaline phosphatase 82. Hemoglobin 12 g/dL, hematocrit 38.3%. BNP 362. NT proBNP 2741. Hemoglobin A1c 6  Collected: February 19, 2021. BUN 36, creatinine 2.19 mg/dL. eGFR 31 Sodium 144, potassium 4.5, chloride 109, bicarbonate 25. AST 16, ALT 13, alkaline phosphatase 81. PSA 15. NT proBNP 3304. Uric acid 7.7     Latest Ref Rng & Units 03/27/2013   10:12 AM 08/06/2011    4:57 AM 06/08/2011    5:32 AM  CBC  WBC 4.0 - 10.5 K/uL  10.6  7.7   Hemoglobin 13.0 - 17.0 g/dL 13.9  12.5  10.8   Hematocrit 39.0 - 52.0 % 41.0  37.7  32.9   Platelets 150 - 400 K/uL  158  151        Latest Ref Rng & Units 09/18/2021    9:21 AM 09/04/2021    8:15 AM 08/26/2021    8:13 AM  CMP  Glucose 70 - 99 mg/dL 114  106  115   BUN 8 - 27 mg/dL 44  46  39   Creatinine 0.76 - 1.27 mg/dL 2.18  2.31  2.00   Sodium 134 - 144 mmol/L 142  143  143   Potassium 3.5 - 5.2 mmol/L 3.6  3.5  3.7   Chloride 96 - 106 mmol/L 106  103  103   CO2 20 - 29 mmol/L 21  22  20    Calcium 8.6 - 10.2 mg/dL 8.7  9.0  9.3     Lipid Panel     Component Value Date/Time   CHOL 128 06/29/2021 0925   TRIG 61 06/29/2021 0925   HDL 49 06/29/2021 0925   LDLCALC 66 06/29/2021 0925   LDLDIRECT 117 (H) 04/08/2021 0818    LABVLDL 13 06/29/2021 0925    No components found for: "NTPROBNP" Recent Labs    04/08/21 0818 04/27/21 0900 05/20/21 0912 08/11/21 0841 08/19/21 0912 08/26/21 0813 09/04/21 0815 09/18/21 0921  PROBNP 4,300* 4,660* 8,074* 8,349* 7,500* 7,398* 6,003* 5,985*   No results for input(s): "TSH" in the last 8760 hours.  BMP Recent Labs    08/26/21 0813 09/04/21 0815 09/18/21 0921  NA 143 143 142  K 3.7 3.5 3.6  CL 103 103 106  CO2 20 22 21   GLUCOSE 115* 106* 114*  BUN 39* 46* 44*  CREATININE 2.00* 2.31* 2.18*  CALCIUM 9.3 9.0 8.7    HEMOGLOBIN A1C Lab Results  Component Value Date   HGBA1C 6.0 11/30/2011   MPG 134 (H) 06/07/2011    Cardiac Panel (last 3 results) No results for input(s): "CKTOTAL", "CKMB", "TROPONINIHS", "RELINDX" in the last 72 hours.  CHOLESTEROL Recent Labs    04/08/21 0818 06/29/21 0925  CHOL 178 128    Hepatic Function Panel Recent Labs    04/08/21 0818 06/29/21 0925  PROT 7.3 6.7  ALBUMIN 4.2 3.9  AST 27 31  ALT 19 26  ALKPHOS 104 102  BILITOT 1.8* 2.0*  BILIDIR  --  0.52*   IMPRESSION:    ICD-10-CM   1. Chronic combined systolic and diastolic heart failure (HCC)  I50.42     2. Cardiomyopathy, unspecified type ([place])  I42.9     3. Coronary atherosclerosis due to calcified coronary lesion  I25.10    I25.84     4. Hypercholesteremia  E78.00     5. Elevated Lp(a)  E78.41     6. Central sleep apnea  G47.31     7. Prostate cancer ([place])  C61     8. Pulmonary nodule  R91.1         RECOMMENDATIONS: Joel Duarte is a 76 y.o. African-American male whose past medical history and cardiac risk factors include: Cardiomyopathy, chronic combined systolic and diastolic heart failure, severe coronary artery calcification (total CAC 1130/93rd percentile), elevated LP(a), hypertension with chronic kidney disease stage IIIb, erectile dysfunction, gout, history of seizures, central sleep apnea, prediabetes, prostate cancer, pulmonary  nodules, advanced age.  Chronic combined systolic and diastolic heart failure (HCC) / Cardiomyopathy, unspecified type ([place]) Stage B,  NYHA class II. Has diuresed well with oral medications, since July 2023. Home weights range between 209 - 215# and if his weight exceeds 220# he starts his prn Zaroxolyn.  Up titration of GDMT has been difficult either due to both renal function or patient's intolerance to medications. In the past Entresto was discontinued due to worsening renal function, spironolactone discontinued secondary to him feeling tired, fatigued, worn out. Bidil did not make him feel well.  Last OV transitioned him from Bidil to isosorbide dinitrate/hydralazine which is effecting his ability to focus / headaches.  Hold isordil for now and continue hydralazine.  Denies angina pectoris.  Coronary atherosclerosis due to calcified coronary lesion Total CAC 1130, 93rd percentile. Continue aspirin and statin therapy. In the past his MPI was reported to be high risk due to reduced LVEF but no overt reversible ischemia.  However given his renal function shared decision was to hold off on angiography or coronary CTA as he is at higher risk for contrast-induced nephropathy/progression of his renal function especially when he is asymptomatic. He denies anginal discomfort. However, given his cardiomyopathy / HF he is at higher risk for chest pain / ACS and is educated on seeking medical attention sooner by going to the closest ER via EMS if his current symptoms increase in intensity, frequency, duration, or has typical chest pain as discussed in the office.  Patient verbalized understanding.  Hypercholesteremia / Elevated Lp(a) Currently on atorvastatin.   Initial LDL 117 mg/dL in the last LP(a) 104.9 With up titration of statin therapy his most recent LDL was 66 mg/dL.  Central sleep apnea Reemphasized the importance of CPAP compliance  Prostate cancer [facility]) We will defer management to  urology/PCP.  Pulmonary nodule Defer management to PCP.  At last office visit there was concerns for possible atrial fibrillation.  Zio patch that was performed in the interim does not illustrate any evidence of A-fib during the monitoring period.  For now recommended him to discontinue Eliquis.  In addition, given his chronic kidney disease IIIb /IV I have asked him to discuss with PCP and consider establishing care with nephrology going forward.  Also discussed his candidacy for enrolling into the LUX-Dx TRENDS trial but he would like to review the consent and trail design prior to consenting.   FINAL MEDICATION LIST END OF ENCOUNTER: No orders of the defined types were placed in this encounter.    Current Outpatient Medications:    allopurinol (ZYLOPRIM) 100 MG tablet, Take 1 tablet (100 mg total) by mouth daily., Disp: 90 tablet, Rfl: 2   aspirin EC 81 MG tablet, Take 1 tablet (81 mg total) by mouth daily. Swallow whole., Disp: 30 tablet, Rfl: 11   atorvastatin (LIPITOR) 40 MG tablet, Take 1 tablet (40 mg total) by mouth at bedtime., Disp: 90 tablet, Rfl: 0   azelastine (ASTELIN) 0.1 % nasal spray, USE ONE PUFF IN EACH NOSTRIL TWICE A DAY AS NEEDED, Disp: 30 mL, Rfl: 6   azelastine (ASTELIN) 0.1 % nasal spray, Place 2 sprays into both nostrils 2 (two) times daily., Disp: 30 mL, Rfl: 11   benzonatate (TESSALON) 100 MG capsule, Take 1-2 capsules (100-200 mg total) by mouth 3 (three) times daily as needed for cough, Disp: 50 capsule, Rfl: 1   carvedilol (COREG) 25 MG tablet, Take 1 tablet (25 mg total) by mouth 2 (two) times daily., Disp: 180 tablet, Rfl: 2   dapagliflozin propanediol (FARXIGA) 10 MG TABS tablet, Take by mouth daily., Disp: , Rfl:  doxazosin (CARDURA) 8 MG tablet, Take 1 tablet (8 mg total) by mouth in the morning AND 0.5 tablets (4 mg total) at bedtime., Disp: 135 tablet, Rfl: 2   dutasteride (AVODART) 0.5 MG capsule, Take 1 capsule (0.5 mg total) by mouth daily., Disp: 90  capsule, Rfl: 3   fluticasone (FLONASE) 50 MCG/ACT nasal spray, USE 1 SPRAY INTO EACH NOSTRIL TWO TIMES DAILY AS NEEDED, Disp: 16 g, Rfl: 5   levETIRAcetam (KEPPRA) 750 MG tablet, Take 1 tablet (750 mg total) by mouth 2 (two) times daily., Disp: 180 tablet, Rfl: 3   omeprazole (PRILOSEC) 40 MG capsule, Take 40 mg by mouth daily., Disp: , Rfl:    torsemide (DEMADEX) 10 MG tablet, Take 1 tablet (10 mg total) by mouth every morning., Disp: 90 tablet, Rfl: 0   hydrALAZINE (APRESOLINE) 25 MG tablet, Take 1 tablet (25 mg total) by mouth 3 (three) times daily., Disp: 90 tablet, Rfl: 1   metolazone (ZAROXOLYN) 2.5 MG tablet, Take 1 tablet (2.5 mg total) by mouth every Monday, Wednesday, and Friday for 30 doses. (Patient taking differently: Take 2.5 mg by mouth as needed.), Disp: 30 tablet, Rfl: 0  No orders of the defined types were placed in this encounter.   There are no Patient Instructions on file for this visit.   --Continue cardiac medications as reconciled in final medication list. --Return in about 3 months (around 02/11/2022) for Follow up, heart failure management.. Or sooner if needed. --Continue follow-up with your primary care physician regarding the management of your other chronic comorbid conditions.  Patient's questions and concerns were addressed to his satisfaction. He voices understanding of the instructions provided during this encounter.   This note was created using a voice recognition software as a result there may be grammatical errors inadvertently enclosed that do not reflect the nature of this encounter. Every attempt is made to correct such errors.  Rex Kras, [place], [facility]  Pager: [phone] Office: [phone]

## 2021-11-18 IMAGING — DX DG CHEST 2V
2 series · 2 of 2 positions shown · non-contrast
Comparison: 09/13/2009.

CLINICAL DATA: Cough

EXAM:
CHEST - 2 VIEW

[dg chest 2 view (1 of 2)]
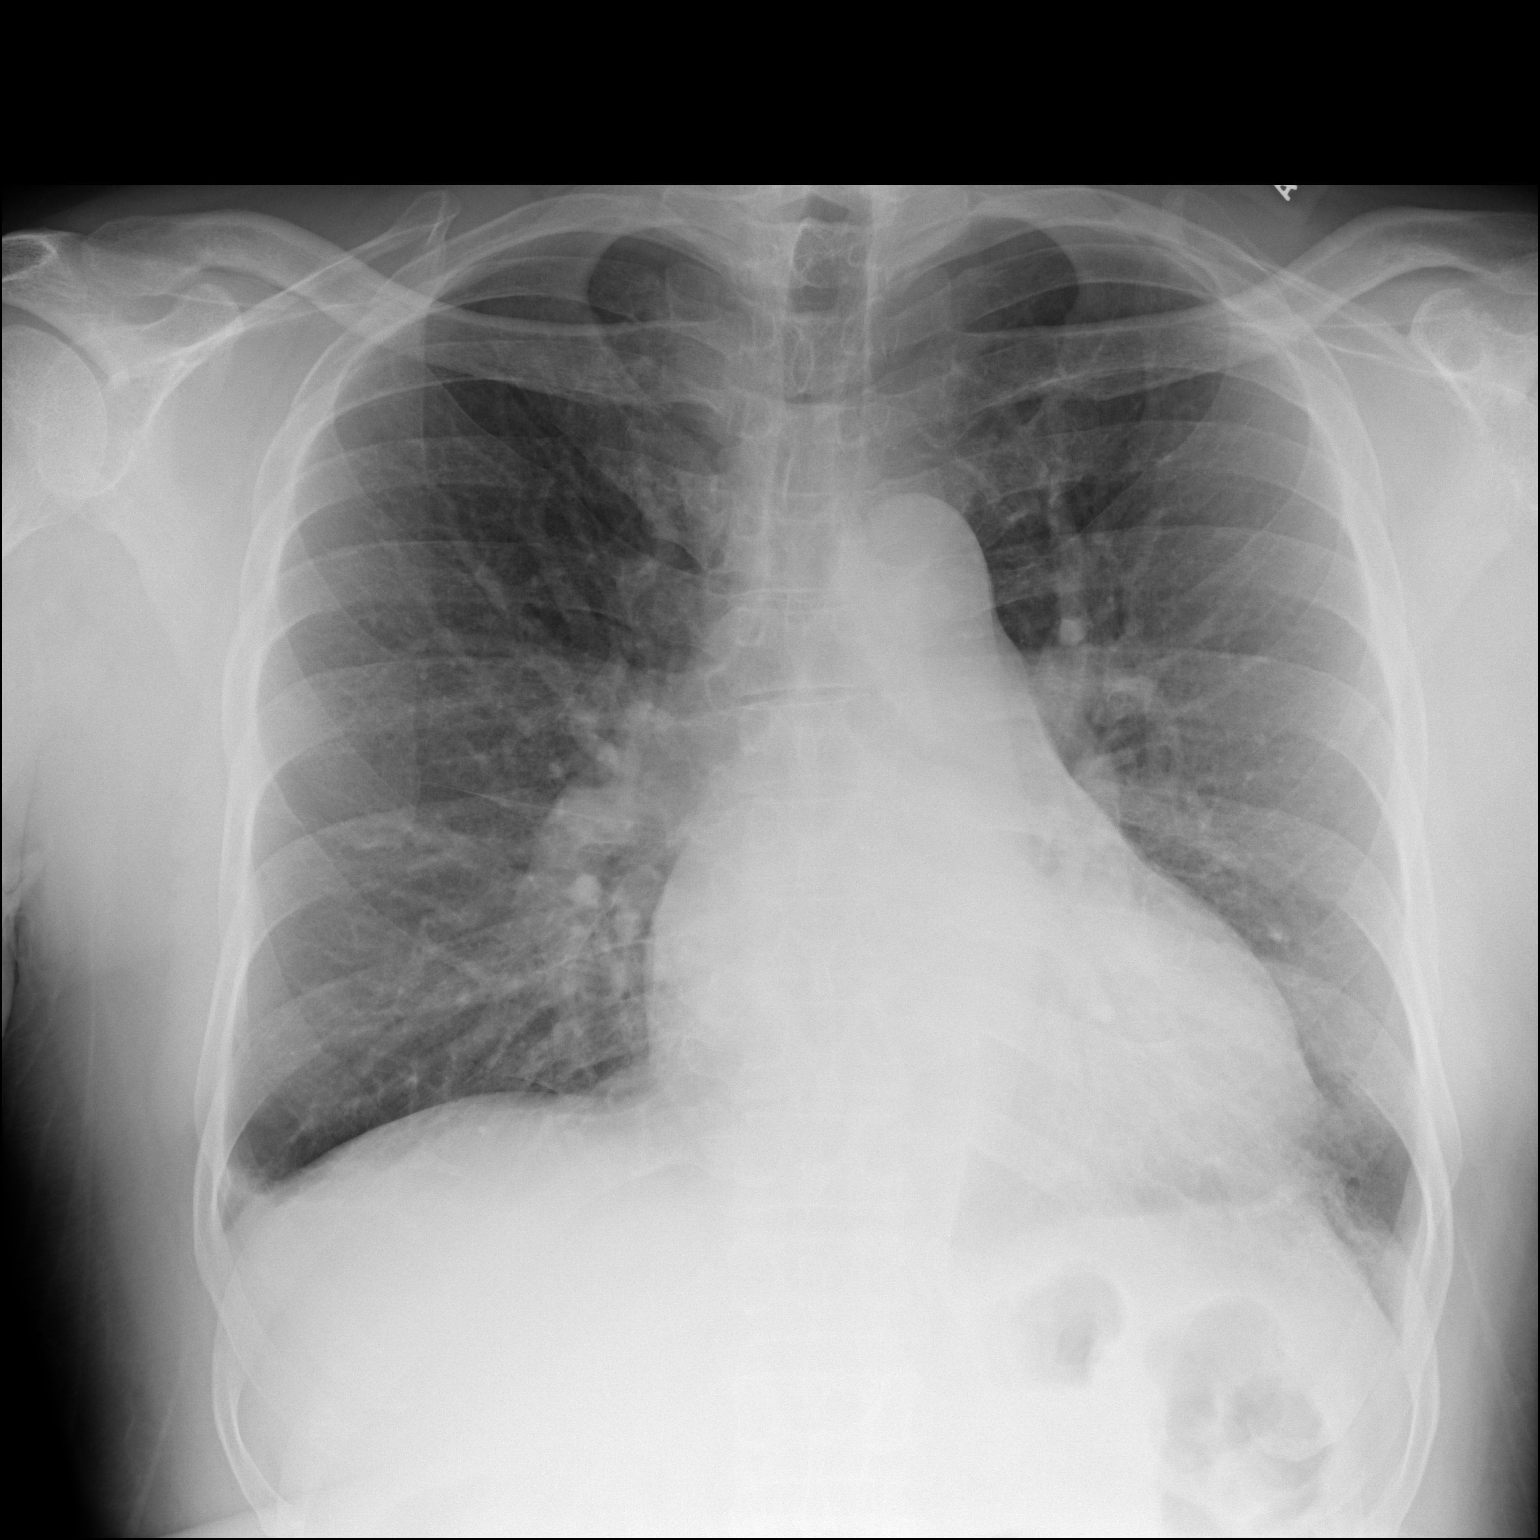

[dg chest 2 view (2 of 2)]
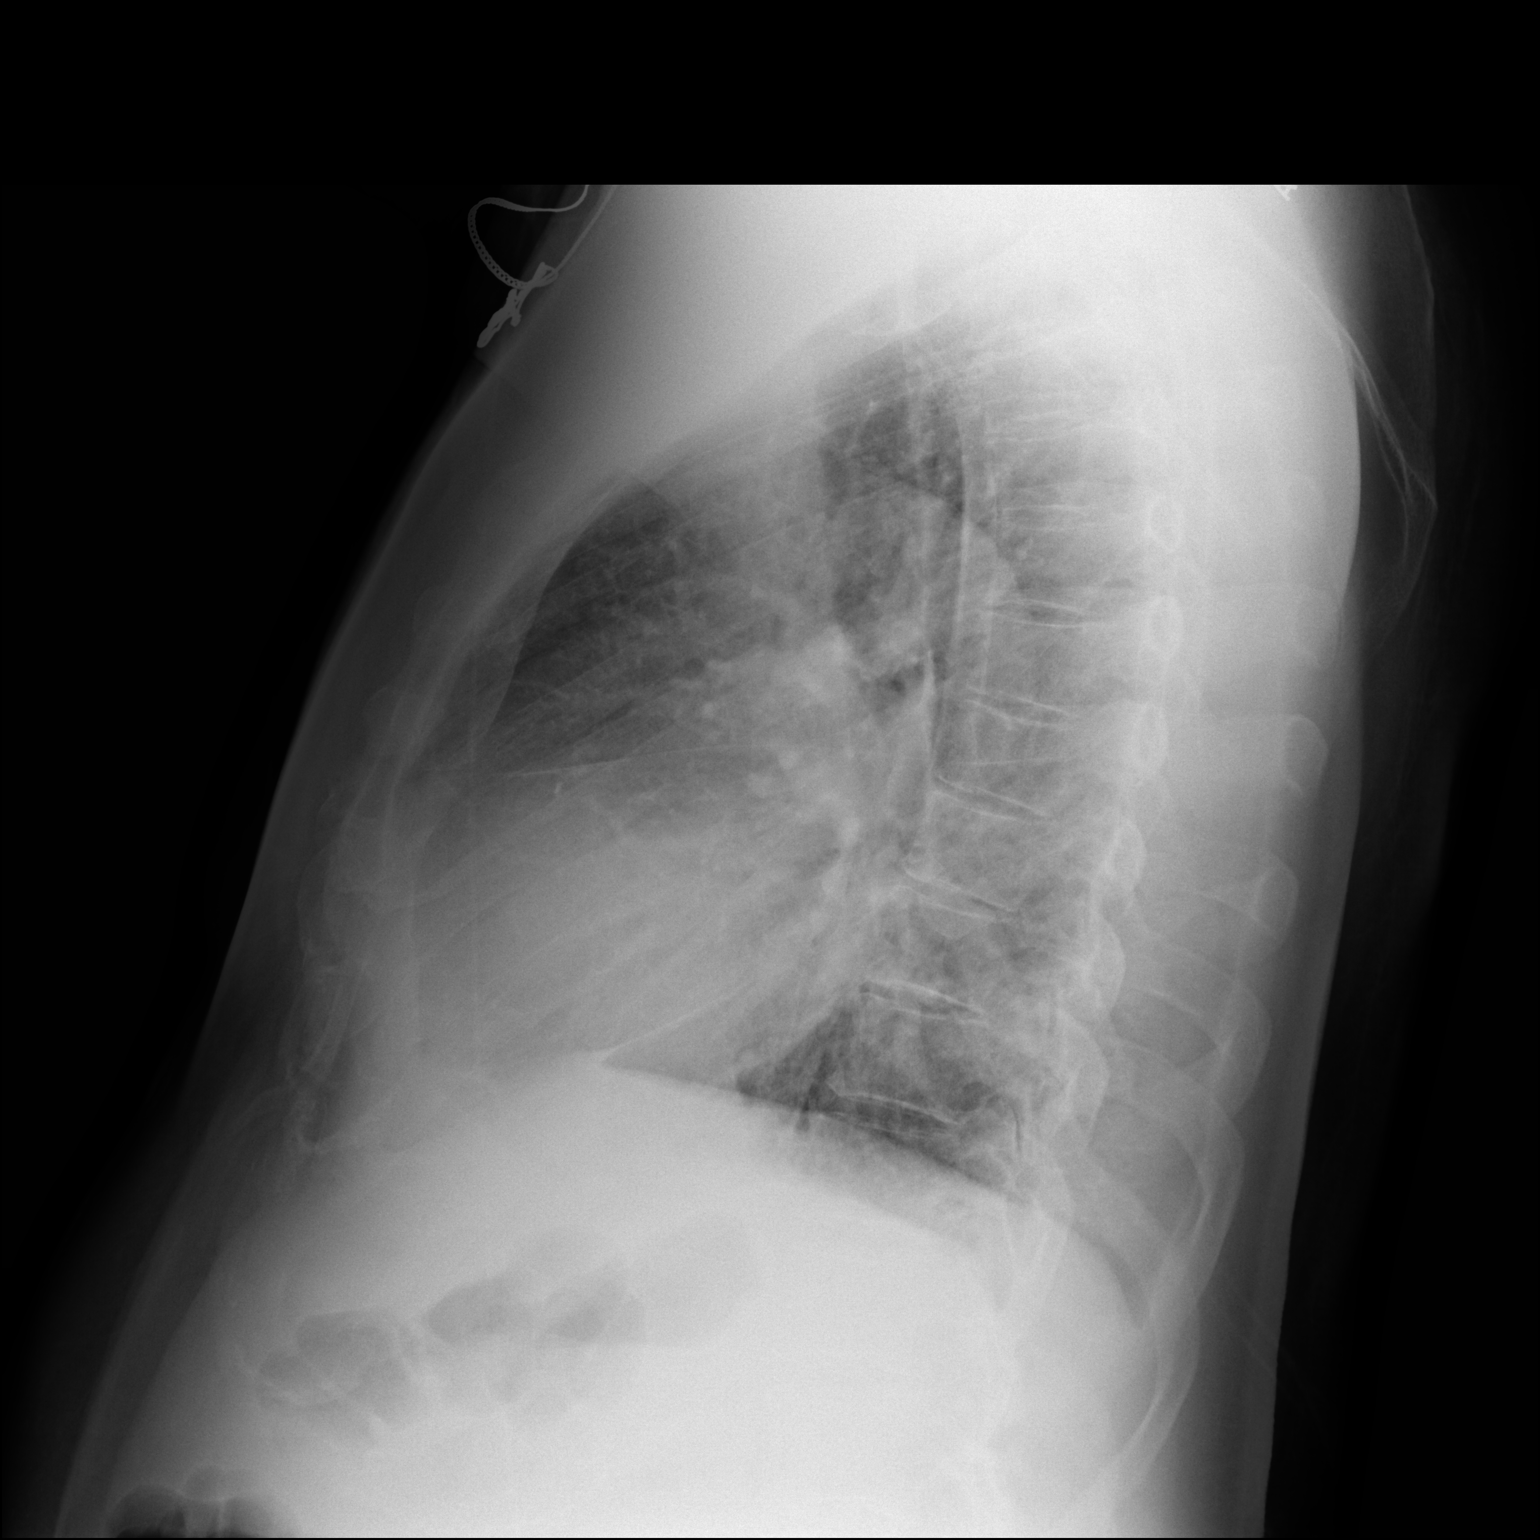

[2 of 2 positions shown; findings below may reference images not displayed]

FINDINGS: Mild enlargement the cardiac silhouette. Left basilar airspace
opacities with partial silhouetting of the left hemidiaphragm. No
visible pleural effusions or pneumothorax. The visualized skeletal
structures are unremarkable.
IMPRESSION: 1. Left basilar airspace opacities, concerning for pneumonia and/or
aspiration.
2. Mild cardiomegaly.

## 2021-11-26 ENCOUNTER — Other Ambulatory Visit (HOSPITAL_COMMUNITY): Payer: Self-pay

## 2021-11-26 MED ORDER — AZITHROMYCIN 250 MG PO TABS
ORAL_TABLET | ORAL | 0 refills | Status: DC
  Filled 2021-11-26: qty 6, 5d supply, fill #0

## 2021-12-14 ENCOUNTER — Other Ambulatory Visit: Payer: Self-pay | Admitting: Cardiology

## 2021-12-14 ENCOUNTER — Other Ambulatory Visit (HOSPITAL_COMMUNITY): Payer: Self-pay

## 2021-12-14 DIAGNOSIS — I5041 Acute combined systolic (congestive) and diastolic (congestive) heart failure: Secondary | ICD-10-CM

## 2021-12-14 DIAGNOSIS — I429 Cardiomyopathy, unspecified: Secondary | ICD-10-CM

## 2021-12-14 MED ORDER — TORSEMIDE 10 MG PO TABS
10.0000 mg | ORAL_TABLET | Freq: Every morning | ORAL | 0 refills | Status: DC
  Filled 2021-12-14: qty 90, 90d supply, fill #0

## 2021-12-15 ENCOUNTER — Other Ambulatory Visit (HOSPITAL_COMMUNITY): Payer: Self-pay

## 2021-12-15 MED ORDER — HYDROXYZINE HCL 10 MG PO TABS
10.0000 mg | ORAL_TABLET | Freq: Every evening | ORAL | 1 refills | Status: DC
  Filled 2021-12-15: qty 30, 30d supply, fill #0

## 2021-12-17 ENCOUNTER — Other Ambulatory Visit (HOSPITAL_COMMUNITY): Payer: Self-pay

## 2021-12-17 MED ORDER — FLUTICASONE PROPIONATE 50 MCG/ACT NA SUSP
1.0000 | Freq: Two times a day (BID) | NASAL | 5 refills | Status: AC | PRN
  Filled 2021-12-17: qty 16, 30d supply, fill #0

## 2021-12-18 ENCOUNTER — Other Ambulatory Visit (HOSPITAL_COMMUNITY): Payer: Self-pay

## 2021-12-22 IMAGING — DX DG CHEST 2V
2 series · 2 of 2 positions shown · non-contrast
Comparison: 11/20/2019 and prior.

CLINICAL DATA: LT BASILAR PNEUMONIA

EXAM:
CHEST - 2 VIEW

[dg chest 2 view (1 of 2)]
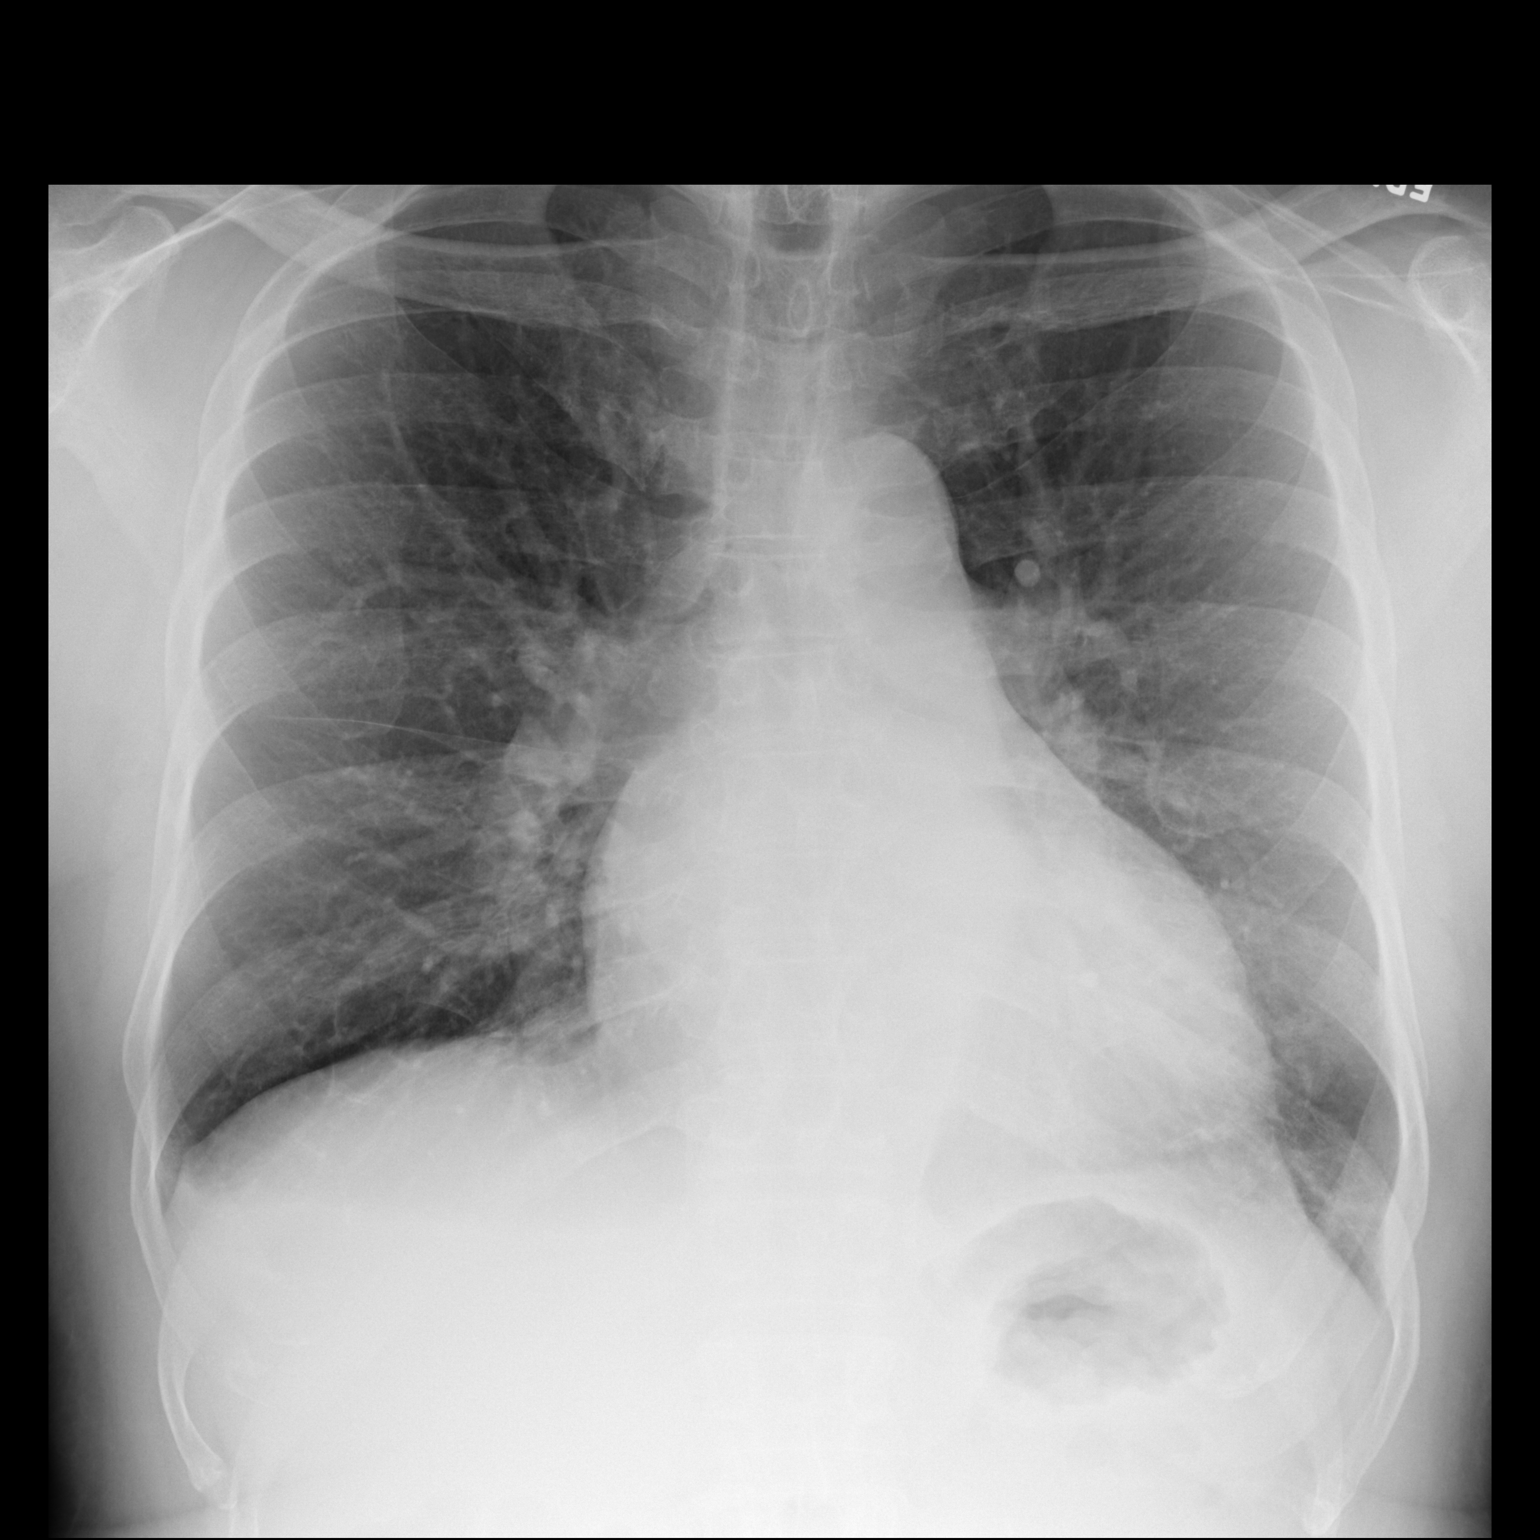

[dg chest 2 view (2 of 2)]
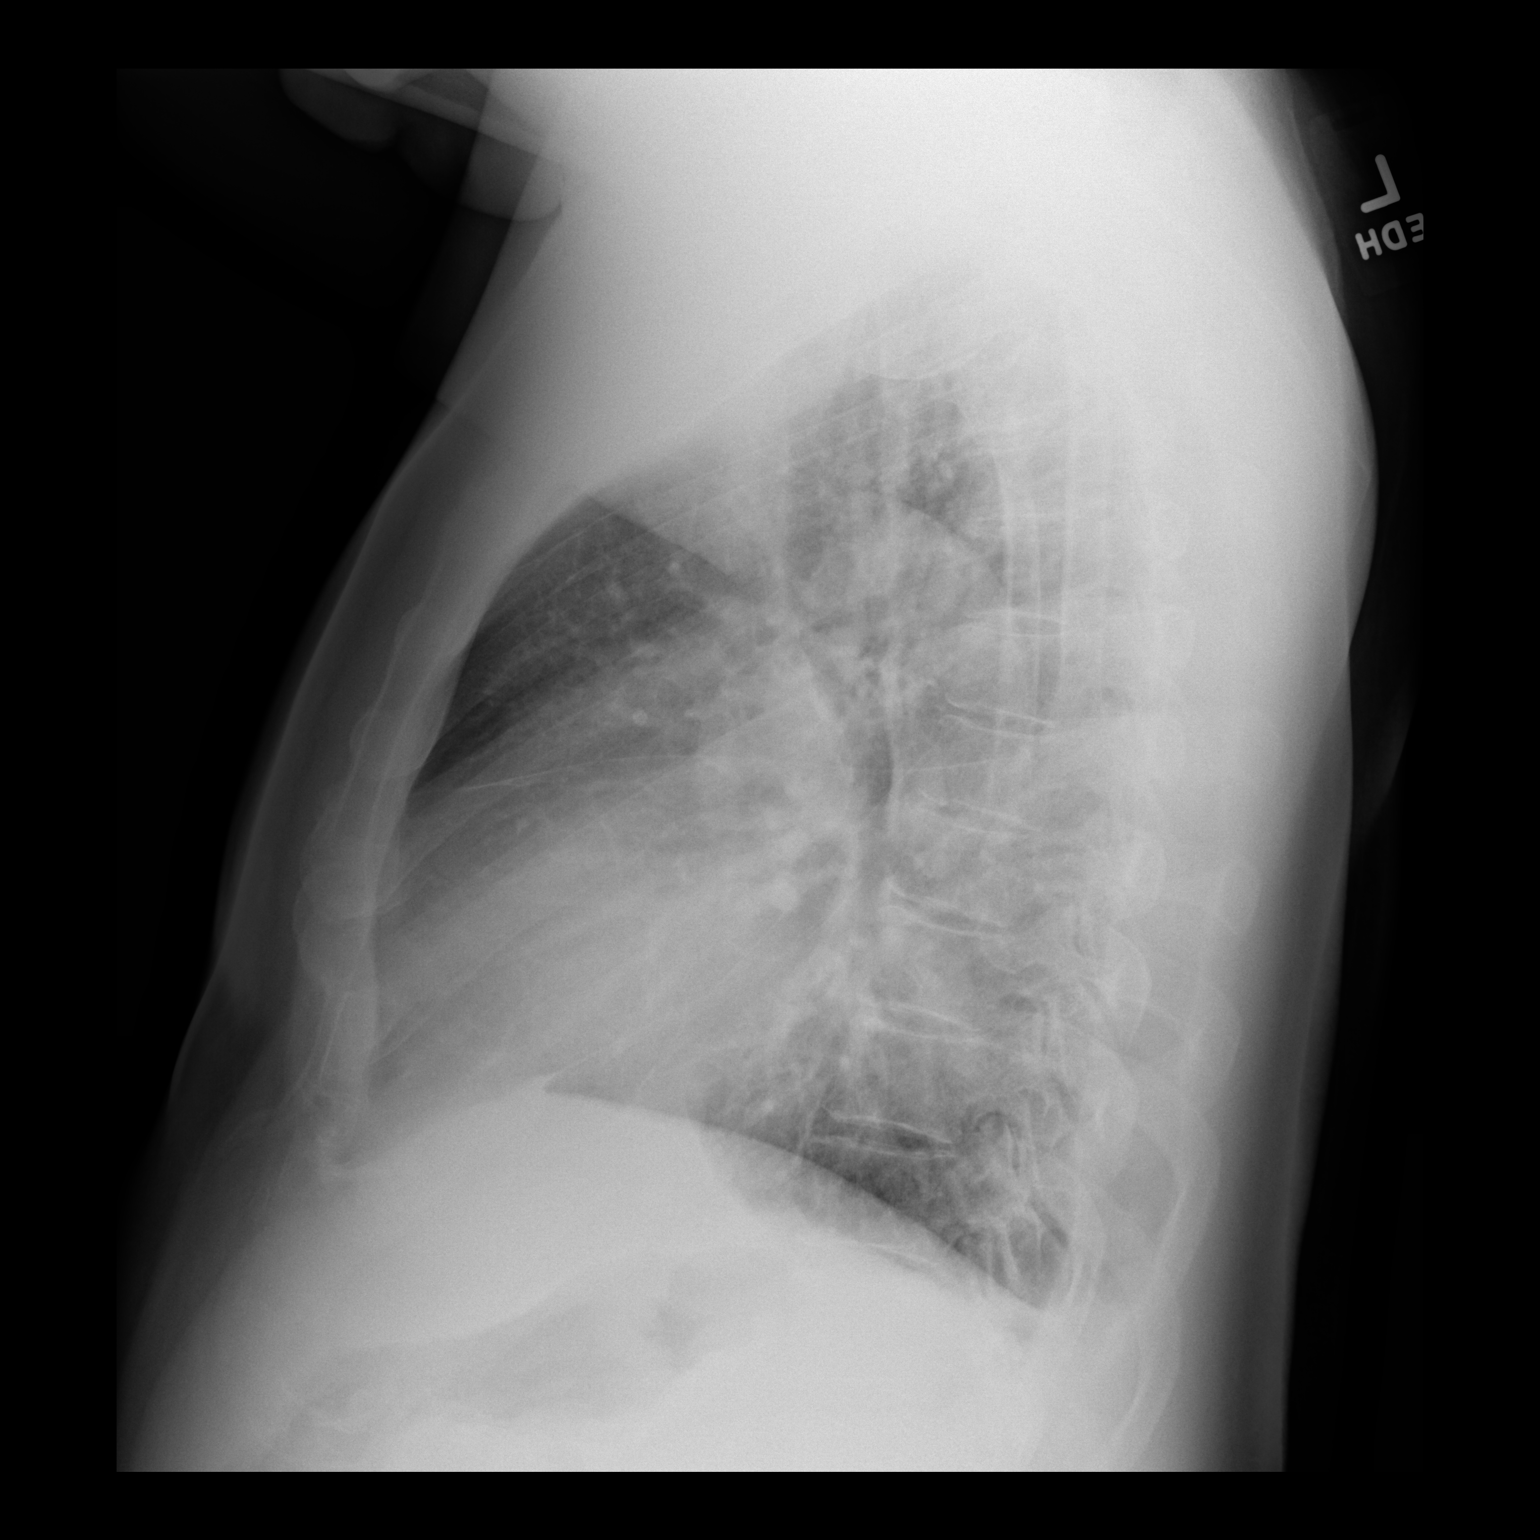

[2 of 2 positions shown; findings below may reference images not displayed]

FINDINGS: Patchy left basilar/retrocardiac opacities, unchanged. No
pneumothorax or pleural effusion. Cardiomegaly and central pulmonary
vascular congestion. Mild spondylosis.
IMPRESSION: Cardiomegaly and central pulmonary vascular congestion.

Unchanged patchy left basilar/retrocardiac opacities, edema versus
infection.

## 2021-12-30 ENCOUNTER — Other Ambulatory Visit (HOSPITAL_COMMUNITY): Payer: Self-pay

## 2022-01-12 ENCOUNTER — Other Ambulatory Visit (HOSPITAL_COMMUNITY): Payer: Self-pay

## 2022-01-12 MED ORDER — AZITHROMYCIN 250 MG PO TABS
ORAL_TABLET | ORAL | 0 refills | Status: AC
  Filled 2022-01-12: qty 6, 5d supply, fill #0

## 2022-01-12 MED ORDER — CARVEDILOL 25 MG PO TABS
25.0000 mg | ORAL_TABLET | Freq: Two times a day (BID) | ORAL | 2 refills | Status: DC
  Filled 2022-01-12: qty 180, 90d supply, fill #0

## 2022-01-13 ENCOUNTER — Other Ambulatory Visit (HOSPITAL_COMMUNITY): Payer: Self-pay

## 2022-01-15 ENCOUNTER — Other Ambulatory Visit: Payer: Self-pay | Admitting: Cardiology

## 2022-01-15 ENCOUNTER — Other Ambulatory Visit (HOSPITAL_COMMUNITY): Payer: Self-pay

## 2022-01-15 DIAGNOSIS — I5041 Acute combined systolic (congestive) and diastolic (congestive) heart failure: Secondary | ICD-10-CM

## 2022-01-15 MED ORDER — METOLAZONE 2.5 MG PO TABS
2.5000 mg | ORAL_TABLET | ORAL | 4 refills | Status: DC
  Filled 2022-01-15: qty 12, 28d supply, fill #0

## 2022-02-08 ENCOUNTER — Other Ambulatory Visit: Payer: Self-pay

## 2022-02-08 ENCOUNTER — Other Ambulatory Visit (HOSPITAL_COMMUNITY): Payer: Self-pay

## 2022-02-08 ENCOUNTER — Telehealth: Payer: Self-pay

## 2022-02-08 MED ORDER — LEVETIRACETAM 750 MG PO TABS: 750.0000 mg | ORAL_TABLET | Freq: Two times a day (BID) | ORAL | 0 refills | Status: DC

## 2022-02-08 MED ORDER — LEVETIRACETAM 750 MG PO TABS
750.0000 mg | ORAL_TABLET | Freq: Two times a day (BID) | ORAL | 0 refills | Status: DC
  Filled 2022-02-08: qty 180, 90d supply, fill #0

## 2022-02-08 NOTE — Telephone Encounter (Addendum)
MEDICATION:levETIRAcetam (KEPPRA) 750 MG tablet   PHARMACY:[facility] [address], [place], [place] [address]  Comments: Patient wants to update pharmacy to be only this pharmacy.   **Let patient know to contact pharmacy at the end of the day to make sure medication is ready. **  ** Please notify patient to allow 48-72 hours to process**  **Encourage patient to contact the pharmacy for refills or they can request refills through [facility]**

## 2022-02-09 ENCOUNTER — Ambulatory Visit: Payer: Medicare HMO | Admitting: Cardiology

## 2022-02-09 ENCOUNTER — Encounter: Payer: Self-pay | Admitting: Cardiology

## 2022-02-09 VITALS — BP 150/95 | HR 63 | Ht 75.0 in | Wt 210.8 lb

## 2022-02-09 DIAGNOSIS — R911 Solitary pulmonary nodule: Secondary | ICD-10-CM

## 2022-02-09 DIAGNOSIS — E7841 Elevated Lipoprotein(a): Secondary | ICD-10-CM

## 2022-02-09 DIAGNOSIS — I429 Cardiomyopathy, unspecified: Secondary | ICD-10-CM

## 2022-02-09 DIAGNOSIS — C61 Malignant neoplasm of prostate: Secondary | ICD-10-CM

## 2022-02-09 DIAGNOSIS — E7800 Pure hypercholesterolemia, unspecified: Secondary | ICD-10-CM

## 2022-02-09 DIAGNOSIS — G4731 Primary central sleep apnea: Secondary | ICD-10-CM

## 2022-02-09 DIAGNOSIS — I2510 Atherosclerotic heart disease of native coronary artery without angina pectoris: Secondary | ICD-10-CM

## 2022-02-09 DIAGNOSIS — I5042 Chronic combined systolic (congestive) and diastolic (congestive) heart failure: Secondary | ICD-10-CM

## 2022-02-09 NOTE — Progress Notes (Signed)
ID:  Joel Duarte, DOB 1945/02/17, MRN [id number]  PCP:  Rogers Blocker, MD  Cardiologist:  Rex Kras, DO, [facility] (established care April 03, 2021)  Date: 02/09/22 Last Office Visit: 11/11/2021  Chief Complaint  Patient presents with   Chronic combined systolic and diastolic heart failure   Follow-up    3 month    HPI  Joel Duarte is a 77 y.o. African-American male whose past medical history and cardiovascular risk factors include: Cardiomyopathy, chronic combined systolic and diastolic heart failure, severe coronary artery calcification (total CAC 1130/93rd percentile), elevated LP(a), hypertension with chronic kidney disease stage 3b/4, erectile dysfunction, gout, history of seizures, central sleep apnea, prediabetes, prostate cancer, pulmonary nodules, advanced age.  Due to his severe coronary artery calcification he did undergo ischemic work-up which included a stress test in April 2023.  His stress test was reported to be high risk due to reduced LVEF, dilated LV cavity, but no definitive evidence of reversible ischemia.  Given his progression of CKD and being asymptomatic (no anginal discomfort) shared decision was to monitor him clinically to prevent the risk of worsening renal function leading to dialysis.  However, on multiple office visits he has been educated that if he develops chest pain concerning for anginal discomfort he needs to seek medical attention by going to the closest ER via EMS.  If he has symptoms of ACS angiography is warranted irrespective of the renal function.  Patient presents today for 45-monthfollow-up visit given his cardiomyopathy and heart failure.  Over the last 3 months patient states " feels better now than ever before."  He has lost 10 pounds since last office visit.  He uses metolazone on as needed basis.  He has stopped using hydralazine and isosorbide dinitrate stating that " I cannot tolerate it."  He does use carvedilol 12.5 mg p.o. every morning and  25 mg p.o. every afternoon.  His ideal weight at which point he feels asymptomatic is between 204-206 pounds.  Home blood pressure and weight log reviewed.  No hospitalizations or urgent care visits for cardiovascular reasons.   FUNCTIONAL STATUS: No structured exercise program or daily routine.   ALLERGIES: No Known Allergies  MEDICATION LIST PRIOR TO VISIT: Current Meds  Medication Sig   allopurinol (ZYLOPRIM) 100 MG tablet Take 1 tablet (100 mg total) by mouth daily.   aspirin EC 81 MG tablet Take 1 tablet (81 mg total) by mouth daily. Swallow whole.   atorvastatin (LIPITOR) 40 MG tablet Take 1 tablet (40 mg total) by mouth at bedtime.   azelastine (ASTELIN) 0.1 % nasal spray USE ONE PUFF IN EACH NOSTRIL TWICE A DAY AS NEEDED   azelastine (ASTELIN) 0.1 % nasal spray Place 2 sprays into both nostrils 2 (two) times daily.   carvedilol (COREG) 25 MG tablet Take 1 tablet (25 mg total) by mouth 2 (two) times daily. (Patient taking differently: Take 25 mg by mouth 2 (two) times daily. 12.5 mg po qAM and 25 mg po qPM)   dapagliflozin propanediol (FARXIGA) 10 MG TABS tablet Take by mouth daily.   doxazosin (CARDURA) 8 MG tablet Take 1 tablet (8 mg total) by mouth in the morning AND 0.5 tablets (4 mg total) at bedtime.   dutasteride (AVODART) 0.5 MG capsule Take 1 capsule (0.5 mg total) by mouth daily.   fluticasone (FLONASE) 50 MCG/ACT nasal spray Place 1 spray into both nostrils 2 (two) times daily as needed.   levETIRAcetam (KEPPRA) 750 MG tablet Take 1 tablet (750  mg total) by mouth 2 (two) times daily.   metolazone (ZAROXOLYN) 2.5 MG tablet Take 1 tablet (2.5 mg total) by mouth every Monday, Wednesday, and Friday for 60 doses. (Patient taking differently: Take 2.5 mg by mouth as needed (weight gain).)   torsemide (DEMADEX) 10 MG tablet Take 1 tablet (10 mg total) by mouth every morning.     PAST MEDICAL HISTORY: Past Medical History:  Diagnosis Date   Allergy    Enlarged prostate     Gout    Hyperlipidemia    Hypertension    Osteoarthrosis, unspecified whether generalized or localized, unspecified site    Seizures ([place])    history of 1 a year ago    PAST SURGICAL HISTORY: Past Surgical History:  Procedure Laterality Date   no surgical h/o  June 2014    FAMILY HISTORY: The patient family history includes Healthy in his child and sister; Heart Problems in his mother; Hypertension in his mother.  SOCIAL HISTORY:  The patient  reports that he has never smoked. He has never used smokeless tobacco. He reports that he does not currently use alcohol. He reports that he does not use drugs.  REVIEW OF SYSTEMS: Review of Systems  Constitutional: Positive for malaise/fatigue (improved) and weight loss.  Cardiovascular:  Positive for dyspnea on exertion (improved - now only w/ over exertion). Negative for chest pain, leg swelling, orthopnea, palpitations, paroxysmal nocturnal dyspnea and syncope.  Respiratory:  Negative for shortness of breath.     PHYSICAL EXAM:    02/09/2022   10:54 AM 11/11/2021   10:15 AM 11/10/2021    2:36 PM  Vitals with BMI  Height '6\' 3"'$  '6\' 3"'$  '6\' 3"'$   Weight 210 lbs 13 oz 220 lbs 217 lbs 6 oz  BMI 26.35 46.5 03.54  Systolic 656 812 751  Diastolic 95 87 82  Pulse 63 74 68    Physical Exam  Constitutional: No distress.  Age appropriate, hemodynamically stable.   Neck: No JVD present.  Cardiovascular: Normal rate, regular rhythm, S1 normal, S2 normal, intact distal pulses and normal pulses. Exam reveals no gallop, no S3 and no S4.  Murmur heard. Holosystolic murmur is present with a grade of 3/6 at the apex radiating to the axilla. Pulmonary/Chest: Effort normal and breath sounds normal. No stridor. He has no wheezes. He has no rales.  Abdominal: Soft. Bowel sounds are normal. He exhibits no distension. There is no abdominal tenderness.  Musculoskeletal:        General: No edema (Wears compression stockings.).     Cervical back: Neck  supple.  Neurological: He is alert and oriented to person, place, and time. He has intact cranial nerves (2-12).  Skin: Skin is warm and moist.   CARDIAC DATABASE: EKG: 04/03/2021: NSR, 66 bpm, first-degree AV block, left axis, possible old anteroseptal infarct, nonspecific T wave changes.  10/07/2021: Probable sinus rhythm with first-degree AV block, atrial bigeminy, left axis, left anterior fascicular block, IVCD, nonspecific T wave abnormality.  Echocardiogram: 04/10/2021:  Left ventricle cavity is normal in size. Severe concentric hypertrophy of the left ventricle. Speckled pattern suggests infiltrative cardiomyopathy.  Hypokinetic global wall motion. Doppler evidence of grade I (impaired) diastolic dysfunction, normal LAP. Severely depressed LV systolic function with visual EF 25-30%. Calculated EF 30%.  Left atrial cavity is severely dilated at 5.2 cm. Right atrial cavity is mildly dilated.  Right ventricle cavity is normal in size. Mild concentric hypertrophy of the right ventricle. Severely reduced right ventricular function.  Trileaflet  aortic valve.  Trace aortic regurgitation.  Structurally normal mitral valve.  Moderate (Grade II) mitral regurgitation.  Structurally normal tricuspid valve.  Moderate tricuspid regurgitation.  Severe pulmonary hypertension. RVSP measures 72 mmHg.  Pericardium is normal. Insignificant pericardial effusion.  IVC is dilated with poor inspiration collapse consistent with elevated right atrial pressure.   Stress Testing: Exercise/Lexiscan Tetrofosmin stress test 05/06/2021: Exercise nuclear stress test was performed using Bruce protocol. Patient reached 5.6 METS, and 71% of age predicted maximum heart rate. Because of sub-maximal HR the patient was injected with 0.4 mg of Intravenous Lexiscan over 15 sec. Exercise capacity was low. No chest pain reported. Dyspnea and dizziness reported. Blunted heart rate response. Unable to comment on post exercise blood  pressure, as reported blood pressure after stress is only after Lexiscan injection. Stress EKG at 71% MPHR showed sinus rhythm, LAFB, occasional PVC, no ischemic changes.  Dilated LV cavity with moderate global decrease in myocardial wall motion and thickening. Stress LVEF 41%. SPECT images show small area of mild intensity, fixed perfusion defect in basal inferior myocardium. No definite evidence of ischemia. High risk study in the setting of dilated cardiomyopathy.   Heart Catheterization: None  CT Cardiac Scoring 04/13/2021 1. Coronary calcium score is 1130 and this is at percentile 93 for patients of the same age, gender and ethnicity.  2. Nonspecific patchy densities at the lung bases which could represent combination of chronic changes and/or atelectasis. Inaddition, there are small indeterminate nodule, largest measures 5 mm. No follow-up needed if patient is low-risk (and has no known or suspected primary neoplasm). Non-contrast chest CT can be considered in 12 months if patient is high-risk. This recommendation follows the consensus statement: Guidelines for Management of Incidental Pulmonary Nodules Detected on CT Images: From the Fleischner Society 2017; Radiology 2017; 284:228-243.  3. Sclerotic focus involving a thoracic vertebral body. This has benign characteristics but recommend correlation with PSA level based on history of prostate cancer.  4. Mild cardiomegaly.  Cardiac monitor (Zio Patch): 10/07/2021 - 10/21/2021 Dominant rhythm normal sinus rhythm, first-degree AV block, IVCD, followed by bradycardia burden (22%). Heart rate 33-197 bpm.  Avg HR 55 bpm. No atrial fibrillation, high grade AV block, pauses (3 seconds or longer). Total ventricular ectopic burden <1%. Episodes of atrial tachycardia with variable complete.   Total supraventricular ectopic burden 19.8% (predominantly isolated beats). Episodes of asymptomatic NSVT - the fastest interval of 15.8 seconds with a max  HR of 197bpm.  Patient triggered events: 0.  LABORATORY DATA: External Labs: Collected: December 01, 2020 Sodium 144, potassium 4.9, chloride 108, bicarb 26. AST 17, ALT 12, alkaline phosphatase 82. Hemoglobin 12 g/dL, hematocrit 38.3%. BNP 362. NT proBNP 2741. Hemoglobin A1c 6  Collected: February 19, 2021. BUN 36, creatinine 2.19 mg/dL. eGFR 31 Sodium 144, potassium 4.5, chloride 109, bicarbonate 25. AST 16, ALT 13, alkaline phosphatase 81. PSA 15. NT proBNP 3304. Uric acid 7.7     Latest Ref Rng & Units 03/27/2013   10:12 AM 08/06/2011    4:57 AM 06/08/2011    5:32 AM  CBC  WBC 4.0 - 10.5 K/uL  10.6  7.7   Hemoglobin 13.0 - 17.0 g/dL 13.9  12.5  10.8   Hematocrit 39.0 - 52.0 % 41.0  37.7  32.9   Platelets 150 - 400 K/uL  158  151        Latest Ref Rng & Units 09/18/2021    9:21 AM 09/04/2021    8:15 AM 08/26/2021  8:13 AM  CMP  Glucose 70 - 99 mg/dL 114  106  115   BUN 8 - 27 mg/dL 44  46  39   Creatinine 0.76 - 1.27 mg/dL 2.18  2.31  2.00   Sodium 134 - 144 mmol/L 142  143  143   Potassium 3.5 - 5.2 mmol/L 3.6  3.5  3.7   Chloride 96 - 106 mmol/L 106  103  103   CO2 20 - 29 mmol/L '21  22  20   '$ Calcium 8.6 - 10.2 mg/dL 8.7  9.0  9.3     Lipid Panel     Component Value Date/Time   CHOL 128 06/29/2021 0925   TRIG 61 06/29/2021 0925   HDL 49 06/29/2021 0925   LDLCALC 66 06/29/2021 0925   LDLDIRECT 117 (H) 04/08/2021 0818   LABVLDL 13 06/29/2021 0925    No components found for: "NTPROBNP" Recent Labs    04/08/21 0818 04/27/21 0900 05/20/21 0912 08/11/21 0841 08/19/21 0912 08/26/21 0813 09/04/21 0815 09/18/21 0921  PROBNP 4,300* 4,660* 8,074* 8,349* 7,500* 7,398* 6,003* 5,985*   No results for input(s): "TSH" in the last 8760 hours.  BMP Recent Labs    08/26/21 0813 09/04/21 0815 09/18/21 0921  NA 143 143 142  K 3.7 3.5 3.6  CL 103 103 106  CO2 '20 22 21  '$ GLUCOSE 115* 106* 114*  BUN 39* 46* 44*  CREATININE 2.00* 2.31* 2.18*  CALCIUM 9.3  9.0 8.7    HEMOGLOBIN A1C Lab Results  Component Value Date   HGBA1C 6.0 11/30/2011   MPG 134 (H) 06/07/2011    Cardiac Panel (last 3 results) No results for input(s): "CKTOTAL", "CKMB", "TROPONINIHS", "RELINDX" in the last 72 hours.  CHOLESTEROL Recent Labs    04/08/21 0818 06/29/21 0925  CHOL 178 128    Hepatic Function Panel Recent Labs    04/08/21 0818 06/29/21 0925  PROT 7.3 6.7  ALBUMIN 4.2 3.9  AST 27 31  ALT 19 26  ALKPHOS 104 102  BILITOT 1.8* 2.0*  BILIDIR  --  0.52*   IMPRESSION:    ICD-10-CM   1. Chronic combined systolic and diastolic heart failure (HCC)  I50.42 Pro b natriuretic peptide (BNP)    Magnesium    Lipid Panel With LDL/HDL Ratio    LDL cholesterol, direct    CMP14+EGFR         RECOMMENDATIONS: Aldrich Lloyd is a 77 y.o. African-American male whose past medical history and cardiac risk factors include: Cardiomyopathy, chronic combined systolic and diastolic heart failure, severe coronary artery calcification (total CAC 1130/93rd percentile), elevated LP(a), hypertension with chronic kidney disease stage IIIb, erectile dysfunction, gout, history of seizures, central sleep apnea, prediabetes, prostate cancer, pulmonary nodules, advanced age.  Chronic combined systolic and diastolic heart failure (HCC) Cardiomyopathy, unspecified type (HCC) Stage B, NYHA class II. Has diuresed well with oral medications, since July 2023. Home blood pressure/weight log reviewed.  For now the ideal weight is 204-206 pounds. Entresto discontinued in the past due to worsening renal function. Spironolactone discontinued in the past secondary to feeling tired/fatigued/worn out He did not tolerate binder well. Since last office visit he stopped isosorbide dinitrate and hydralazine for similar reasons. He has reduced her carvedilol to 12.5 mg p.o. every morning and 25 mg p.o. every afternoon at his discretion. I recommended that if possible discussed with PCP  with regards to reducing Cardura to 4 mg p.o. twice daily and a retrial of hydralazine. Uptitration of GDMT has  been difficult due to medication intolerances/renal function. Patient understands that he has underlying coronary calcium score, high risk stress test, and cardiomyopathy; therefore, if he has new onset of chest pain or heart failure exacerbation he should go to the closest hospital via EMS for further evaluation and management. I would like to repeat fasting lipid profile, CMP, magnesium, direct LDL, NT-proBNP for chronic disease management.   Coronary atherosclerosis due to calcified coronary lesion Total CAC 1130, 93rd percentile. Continue aspirin and statin therapy. MPI results as discussed above.  No anginal discomfort or heart failure symptoms. However, given his cardiomyopathy / HF he is at higher risk for chest pain / ACS and is educated on seeking medical attention sooner by going to the closest ER via EMS if his current symptoms increase in intensity, frequency, duration, or has typical chest pain as discussed in the office.  Patient verbalized understanding.  Hypercholesteremia / Elevated Lp(a) Currently on atorvastatin.   Initial LDL 117 mg/dL in the last LP(a) 104.9 With up titration of statin therapy his most recent LDL was 66 mg/dL. Check fasting lipid profile and direct LDL  Sleep apnea on CPAP Reemphasized the importance of CPAP compliance  Prostate cancer ([place]) We will defer management to urology/PCP.  Pulmonary nodule Defer management to PCP.  FINAL MEDICATION LIST END OF ENCOUNTER: No orders of the defined types were placed in this encounter.    Current Outpatient Medications:    allopurinol (ZYLOPRIM) 100 MG tablet, Take 1 tablet (100 mg total) by mouth daily., Disp: 90 tablet, Rfl: 2   aspirin EC 81 MG tablet, Take 1 tablet (81 mg total) by mouth daily. Swallow whole., Disp: 30 tablet, Rfl: 11   atorvastatin (LIPITOR) 40 MG tablet, Take 1 tablet (40  mg total) by mouth at bedtime., Disp: 90 tablet, Rfl: 0   azelastine (ASTELIN) 0.1 % nasal spray, USE ONE PUFF IN EACH NOSTRIL TWICE A DAY AS NEEDED, Disp: 30 mL, Rfl: 6   azelastine (ASTELIN) 0.1 % nasal spray, Place 2 sprays into both nostrils 2 (two) times daily., Disp: 30 mL, Rfl: 11   carvedilol (COREG) 25 MG tablet, Take 1 tablet (25 mg total) by mouth 2 (two) times daily. (Patient taking differently: Take 25 mg by mouth 2 (two) times daily. 12.5 mg po qAM and 25 mg po qPM), Disp: 180 tablet, Rfl: 2   dapagliflozin propanediol (FARXIGA) 10 MG TABS tablet, Take by mouth daily., Disp: , Rfl:    doxazosin (CARDURA) 8 MG tablet, Take 1 tablet (8 mg total) by mouth in the morning AND 0.5 tablets (4 mg total) at bedtime., Disp: 135 tablet, Rfl: 2   dutasteride (AVODART) 0.5 MG capsule, Take 1 capsule (0.5 mg total) by mouth daily., Disp: 90 capsule, Rfl: 3   fluticasone (FLONASE) 50 MCG/ACT nasal spray, Place 1 spray into both nostrils 2 (two) times daily as needed., Disp: 16 g, Rfl: 5   levETIRAcetam (KEPPRA) 750 MG tablet, Take 1 tablet (750 mg total) by mouth 2 (two) times daily., Disp: 180 tablet, Rfl: 0   metolazone (ZAROXOLYN) 2.5 MG tablet, Take 1 tablet (2.5 mg total) by mouth every Monday, Wednesday, and Friday for 60 doses. (Patient taking differently: Take 2.5 mg by mouth as needed (weight gain).), Disp: 12 tablet, Rfl: 4   torsemide (DEMADEX) 10 MG tablet, Take 1 tablet (10 mg total) by mouth every morning., Disp: 90 tablet, Rfl: 0  Orders Placed This Encounter  Procedures   Pro b natriuretic peptide (BNP)  Magnesium   Lipid Panel With LDL/HDL Ratio   LDL cholesterol, direct   CMP14+EGFR    There are no Patient Instructions on file for this visit.   --Continue cardiac medications as reconciled in final medication list. --Return in about 6 months (around 08/10/2022) for Follow up, heart failure management.. Or sooner if needed. --Continue follow-up with your primary care physician  regarding the management of your other chronic comorbid conditions.  Patient's questions and concerns were addressed to his satisfaction. He voices understanding of the instructions provided during this encounter.   This note was created using a voice recognition software as a result there may be grammatical errors inadvertently enclosed that do not reflect the nature of this encounter. Every attempt is made to correct such errors.  Rex Kras, [place], [facility]  Pager: [phone] Office: [phone]

## 2022-02-16 LAB — LIPID PANEL WITH LDL/HDL RATIO
Cholesterol, Total: 136 mg/dL (ref 100–199)
HDL: 57 mg/dL (ref >39)
LDL Chol Calc (NIH): 67 mg/dL (ref 0–99)
LDL/HDL Ratio: 1.2 ratio (ref 0.0–3.6)
Triglycerides: 56 mg/dL (ref 0–149)
VLDL Cholesterol Cal: 12 mg/dL (ref 5–40)

## 2022-02-16 LAB — CMP14+EGFR
ALT: 19 IU/L (ref 0–44)
AST: 24 IU/L (ref 0–40)
Albumin/Globulin Ratio: 1.3 (ref 1.2–2.2)
Albumin: 4 g/dL (ref 3.8–4.8)
Alkaline Phosphatase: 121 IU/L (ref 44–121)
BUN/Creatinine Ratio: 19 (ref 10–24)
BUN: 42 mg/dL — ABNORMAL HIGH (ref 8–27)
Bilirubin Total: 2 mg/dL — ABNORMAL HIGH (ref 0.0–1.2)
CO2: 21 mmol/L (ref 20–29)
Calcium: 9.2 mg/dL (ref 8.6–10.2)
Chloride: 107 mmol/L — ABNORMAL HIGH (ref 96–106)
Creatinine, Ser: 2.18 mg/dL — ABNORMAL HIGH (ref 0.76–1.27)
Globulin, Total: 3 g/dL (ref 1.5–4.5)
Glucose: 113 mg/dL — ABNORMAL HIGH (ref 70–99)
Potassium: 3.8 mmol/L (ref 3.5–5.2)
Sodium: 145 mmol/L — ABNORMAL HIGH (ref 134–144)
Total Protein: 7 g/dL (ref 6.0–8.5)
eGFR: 31 mL/min/{1.73_m2} — ABNORMAL LOW (ref >59)

## 2022-02-16 LAB — MAGNESIUM: Magnesium: 2.4 mg/dL — ABNORMAL HIGH (ref 1.6–2.3)

## 2022-02-16 LAB — PRO B NATRIURETIC PEPTIDE: NT-Pro BNP: 7437 pg/mL — ABNORMAL HIGH (ref 0–486)

## 2022-02-16 LAB — LDL CHOLESTEROL, DIRECT: LDL Direct: 74 mg/dL (ref 0–99)

## 2022-02-17 ENCOUNTER — Other Ambulatory Visit (HOSPITAL_COMMUNITY): Payer: Self-pay

## 2022-02-18 ENCOUNTER — Other Ambulatory Visit (HOSPITAL_COMMUNITY): Payer: Self-pay

## 2022-02-19 NOTE — Progress Notes (Signed)
Discussed results with patient  and explained instructions. He acknowledged understanding and had no further questions

## 2022-03-15 ENCOUNTER — Other Ambulatory Visit: Payer: Self-pay

## 2022-03-15 DIAGNOSIS — C61 Malignant neoplasm of prostate: Secondary | ICD-10-CM

## 2022-03-16 LAB — PSA: Prostate Specific Ag, Serum: 14.1 ng/mL — ABNORMAL HIGH (ref 0.0–4.0)

## 2022-03-22 ENCOUNTER — Other Ambulatory Visit: Payer: Medicare Other

## 2022-03-29 ENCOUNTER — Ambulatory Visit: Payer: Medicare HMO | Admitting: Urology

## 2022-03-29 VITALS — BP 119/61 | HR 68

## 2022-03-29 DIAGNOSIS — C61 Malignant neoplasm of prostate: Secondary | ICD-10-CM | POA: Diagnosis not present

## 2022-03-29 DIAGNOSIS — R350 Frequency of micturition: Secondary | ICD-10-CM

## 2022-03-29 DIAGNOSIS — N401 Enlarged prostate with lower urinary tract symptoms: Secondary | ICD-10-CM

## 2022-03-29 DIAGNOSIS — N138 Other obstructive and reflux uropathy: Secondary | ICD-10-CM | POA: Diagnosis not present

## 2022-03-29 MED ORDER — DUTASTERIDE 0.5 MG PO CAPS: 0.5000 mg | ORAL_CAPSULE | Freq: Every day | ORAL | 3 refills | Status: DC

## 2022-03-29 NOTE — Progress Notes (Unsigned)
03/29/2022 3:13 PM   Joel Duarte October 19, 1945 [id number]  Referring provider: Rogers Blocker, MD [address] [place],  [place] [id number]  Followup prostate cancer and urinary frequency   HPI: PSA increased to 14.2 from 11.8. PSA prior to 11.8 was 15.0. IPSS 6 QOL 1 on cardura and dutasteride. Urine stream strong. Nocturia 2x. Uirnary frequency every 3 hours.   PMH: Past Medical History:  Diagnosis Date   Allergy    Enlarged prostate    Gout    Hyperlipidemia    Hypertension    Osteoarthrosis, unspecified whether generalized or localized, unspecified site    Seizures ([place])    history of 1 a year ago    Surgical History: Past Surgical History:  Procedure Laterality Date   no surgical h/o  June 2014    Home Medications:  Allergies as of 03/29/2022   No Known Allergies      Medication List        Accurate as of March 29, 2022  3:13 PM. If you have any questions, ask your nurse or doctor.          allopurinol 100 MG tablet Commonly known as: ZYLOPRIM Take 1 tablet (100 mg total) by mouth daily.   aspirin EC 81 MG tablet Take 1 tablet (81 mg total) by mouth daily. Swallow whole.   atorvastatin 40 MG tablet Commonly known as: LIPITOR Take 1 tablet (40 mg total) by mouth at bedtime.   azelastine 0.1 % nasal spray Commonly known as: ASTELIN USE ONE PUFF IN EACH NOSTRIL TWICE A DAY AS NEEDED   azelastine 0.1 % nasal spray Commonly known as: ASTELIN Place 2 sprays into both nostrils 2 (two) times daily.   carvedilol 25 MG tablet Commonly known as: COREG Take 1 tablet (25 mg total) by mouth 2 (two) times daily. What changed: additional instructions   doxazosin 8 MG tablet Commonly known as: CARDURA Take 1 tablet (8 mg total) by mouth in the morning AND 0.5 tablets (4 mg total) at bedtime.   dutasteride 0.5 MG capsule Commonly known as: AVODART Take 1 capsule (0.5 mg total) by mouth daily.   Farxiga 10 MG Tabs tablet Generic drug:  dapagliflozin propanediol Take by mouth daily.   fluticasone 50 MCG/ACT nasal spray Commonly known as: FLONASE Place 1 spray into both nostrils 2 (two) times daily as needed.   levETIRAcetam 750 MG tablet Commonly known as: KEPPRA Take 1 tablet (750 mg total) by mouth 2 (two) times daily.   metolazone 2.5 MG tablet Commonly known as: ZAROXOLYN Take 1 tablet (2.5 mg total) by mouth every Monday, Wednesday, and Friday for 60 doses. What changed:  when to take this reasons to take this   torsemide 10 MG tablet Commonly known as: DEMADEX Take 1 tablet (10 mg total) by mouth every morning.        Allergies: No Known Allergies  Family History: Family History  Problem Relation Age of Onset   Hypertension Mother    Heart Problems Mother    Healthy Sister    Healthy Child     Social History:  reports that he has never smoked. He has never used smokeless tobacco. He reports that he does not currently use alcohol. He reports that he does not use drugs.  ROS: All other review of systems were reviewed and are negative except what is noted above in HPI  Physical Exam: BP 119/61   Pulse 68   Constitutional:  Alert and oriented,  No acute distress. HEENT: [place] AT, moist mucus membranes.  Trachea midline, no masses. Cardiovascular: No clubbing, cyanosis, or edema. Respiratory: Normal respiratory effort, no increased work of breathing. GI: Abdomen is soft, nontender, nondistended, no abdominal masses GU: No CVA tenderness.  Lymph: No cervical or inguinal lymphadenopathy. Skin: No rashes, bruises or suspicious lesions. Neurologic: Grossly intact, no focal deficits, moving all 4 extremities. Psychiatric: Normal mood and affect.  Laboratory Data: Lab Results  Component Value Date   WBC 10.6 (H) 08/06/2011   HGB 13.9 03/27/2013   HCT 41.0 03/27/2013   MCV 79.0 08/06/2011   PLT 158 08/06/2011    Lab Results  Component Value Date   CREATININE 2.18 (H) 02/15/2022    No  results found for: "PSA"  No results found for: "TESTOSTERONE"  Lab Results  Component Value Date   HGBA1C 6.0 11/30/2011    Urinalysis    Component Value Date/Time   COLORURINE YELLOW 06/07/2011 0856   APPEARANCEUR Turbid (A) 09/28/2021 1520   LABSPEC 1.013 06/07/2011 0856   PHURINE 5.0 06/07/2011 0856   GLUCOSEU 3+ (A) 09/28/2021 1520   HGBUR TRACE (A) 06/07/2011 0856   BILIRUBINUR Negative 09/28/2021 1520   KETONESUR NEGATIVE 06/07/2011 0856   PROTEINUR 2+ (A) 09/28/2021 1520   PROTEINUR 30 (A) 06/07/2011 0856   UROBILINOGEN 0.2 06/07/2011 0856   NITRITE Positive (A) 09/28/2021 1520   NITRITE NEGATIVE 06/07/2011 0856   LEUKOCYTESUR Negative 09/28/2021 1520    Lab Results  Component Value Date   LABMICR See below: 09/28/2021   WBCUA 0-5 09/28/2021   LABEPIT 0-10 09/28/2021   MUCUS Present 09/28/2021   BACTERIA Many (A) 09/28/2021    Pertinent Imaging: *** No results found for this or any previous visit.  No results found for this or any previous visit.  No results found for this or any previous visit.  No results found for this or any previous visit.  Results for orders placed during the hospital encounter of 12/19/19  US RENAL  Narrative CLINICAL DATA:  Progressive chronic renal failure, obstructive Rob a thick, hypertension  EXAM: RENAL / URINARY TRACT ULTRASOUND COMPLETE  COMPARISON:  None  FINDINGS: Right Kidney:  Renal measurements: 8.4 x 3.3 x 4.4 cm = volume: 69 mL. Cortical thinning. Probably slightly increased cortical echogenicity. No mass, hydronephrosis, or shadowing calcification.  Left Kidney:  Renal measurements: 11.3 x 6.4 x 5.5 cm = volume: 2 8 mL. Cortical thinning. Increased cortical echogenicity. Cyst at mid upper pole 3.2 x 3.1 x 4.1 cm, simple features. No additional mass, hydronephrosis, or shadowing calcification.  Bladder:  Appears normal for partial degree of bladder distention.  Other:  Enlarged prostate  gland 4.9 x 4.7 x 7.0 cm (volume = 84 cm^3) extending into base of urinary bladder  IMPRESSION: Cortical thinning and probable medical renal disease changes of both kidneys.  4.1 cm LEFT renal cyst.  No evidence of additional renal mass or hydronephrosis.  Prostatomegaly.   Electronically Signed By: Lavonia Dana M.D. On: 12/20/2019 10:53  No valid procedures specified. No results found for this or any previous visit.  No results found for this or any previous visit.   Assessment & Plan:    1. Prostate cancer ([place]) -folllwup 6 months with PSA  2. Benign prostatic hyperplasia with urinary obstruction -continue cardura and dutateride.   3. Urinary frequency ***   No follow-ups on file.  Nicolette Bang, MD  [facility] [place]

## 2022-03-30 ENCOUNTER — Encounter: Payer: Self-pay | Admitting: Urology

## 2022-03-30 LAB — URINALYSIS, ROUTINE W REFLEX MICROSCOPIC
Bilirubin, UA: NEGATIVE
Ketones, UA: NEGATIVE
Leukocytes,UA: NEGATIVE
Nitrite, UA: NEGATIVE
Specific Gravity, UA: 1.015 (ref 1.005–1.030)
Urobilinogen, Ur: 0.2 mg/dL (ref 0.2–1.0)
pH, UA: 5.5 (ref 5.0–7.5)

## 2022-03-30 LAB — MICROSCOPIC EXAMINATION: Bacteria, UA: NONE SEEN

## 2022-03-30 NOTE — Patient Instructions (Signed)

## 2022-04-12 ENCOUNTER — Other Ambulatory Visit (HOSPITAL_COMMUNITY): Payer: Self-pay

## 2022-04-15 ENCOUNTER — Other Ambulatory Visit: Payer: Self-pay

## 2022-04-15 DIAGNOSIS — I5041 Acute combined systolic (congestive) and diastolic (congestive) heart failure: Secondary | ICD-10-CM

## 2022-04-15 DIAGNOSIS — I429 Cardiomyopathy, unspecified: Secondary | ICD-10-CM

## 2022-04-15 MED ORDER — TORSEMIDE 10 MG PO TABS: 10.0000 mg | ORAL_TABLET | Freq: Every morning | ORAL | 0 refills | Status: DC

## 2022-05-08 ENCOUNTER — Other Ambulatory Visit: Payer: Self-pay | Admitting: Neurology

## 2022-07-05 ENCOUNTER — Other Ambulatory Visit: Payer: Self-pay | Admitting: Cardiology

## 2022-07-05 DIAGNOSIS — I429 Cardiomyopathy, unspecified: Secondary | ICD-10-CM

## 2022-07-05 DIAGNOSIS — I5041 Acute combined systolic (congestive) and diastolic (congestive) heart failure: Secondary | ICD-10-CM

## 2022-08-09 ENCOUNTER — Ambulatory Visit: Payer: Medicare HMO | Admitting: Cardiology

## 2022-08-09 ENCOUNTER — Encounter: Payer: Self-pay | Admitting: Cardiology

## 2022-08-09 VITALS — BP 135/82 | HR 50 | Resp 16 | Ht 75.0 in | Wt 212.6 lb

## 2022-08-09 DIAGNOSIS — C61 Malignant neoplasm of prostate: Secondary | ICD-10-CM

## 2022-08-09 DIAGNOSIS — E7800 Pure hypercholesterolemia, unspecified: Secondary | ICD-10-CM

## 2022-08-09 DIAGNOSIS — E7841 Elevated Lipoprotein(a): Secondary | ICD-10-CM

## 2022-08-09 DIAGNOSIS — I5042 Chronic combined systolic (congestive) and diastolic (congestive) heart failure: Secondary | ICD-10-CM

## 2022-08-09 DIAGNOSIS — I4891 Unspecified atrial fibrillation: Secondary | ICD-10-CM

## 2022-08-09 DIAGNOSIS — I429 Cardiomyopathy, unspecified: Secondary | ICD-10-CM

## 2022-08-09 DIAGNOSIS — I2584 Coronary atherosclerosis due to calcified coronary lesion: Secondary | ICD-10-CM

## 2022-08-09 DIAGNOSIS — G4731 Primary central sleep apnea: Secondary | ICD-10-CM

## 2022-08-09 MED ORDER — APIXABAN 5 MG PO TABS: 5.0000 mg | ORAL_TABLET | Freq: Two times a day (BID) | ORAL | 0 refills | Status: DC

## 2022-08-09 NOTE — Progress Notes (Signed)
ID:  Joel Duarte, DOB 1945/12/06, MRN [id number]  PCP:  [name], MD  Cardiologist:  Tessa Lerner, DO, [facility] (established care April 03, 2021)  Date: 08/09/22 Last Office Visit: 02/09/2022  Chief Complaint  Patient presents with   heart failure management   Follow-up    HPI  Joel Duarte is a 77 y.o. African-American male whose past medical history and cardiovascular risk factors include: Newly discovered atrial fibrillation, cardiomyopathy, chronic combined systolic and diastolic heart failure, severe coronary artery calcification (total CAC 1130/93rd percentile), elevated LP(a), hypertension with chronic kidney disease stage 3b/4, erectile dysfunction, gout, history of seizures, central sleep apnea on CPAP, prediabetes, prostate cancer, pulmonary nodules, advanced age.  Due to his severe coronary artery calcification he did undergo ischemic work-up which included a stress test in April 2023.  His stress test was reported to be high risk due to reduced LVEF, dilated LV cavity, but no definitive evidence of reversible ischemia.  Given his progression of CKD and being asymptomatic (no anginal discomfort) shared decision was to monitor him clinically to prevent the risk of worsening renal function leading to dialysis.  However, on multiple office visits he has been educated that if he develops chest pain concerning for anginal discomfort he needs to seek medical attention by going to the closest ER via EMS.  If he has symptoms of ACS angiography is warranted irrespective of the renal function.  Patient presents today for 42-month follow-up visit given his cardiomyopathy and heart failure.  Over the last 6 months he denies anginal chest pain or heart failure symptoms.  He is overall euvolemic on physical examination with minimal swelling in the lower extremities.  She denies orthopnea or PND.  Patient feels at baseline and asymptomatic when his weight is between 204-26 pounds.  If his weight goes  above 210 pounds he does take metolazone for volume management.  He still has not establish care with nephrology given his chronic kidney disease.  He will rediscuss this with PCP.  EKG today notes new onset of atrial fibrillation.  No prior history of bleeding.  FUNCTIONAL STATUS: No structured exercise program or daily routine.   ALLERGIES: No Known Allergies  MEDICATION LIST PRIOR TO VISIT: Current Meds  Medication Sig   allopurinol (ZYLOPRIM) 100 MG tablet Take 1 tablet (100 mg total) by mouth daily.   apixaban (ELIQUIS) 5 MG TABS tablet Take 1 tablet (5 mg total) by mouth 2 (two) times daily.   atorvastatin (LIPITOR) 40 MG tablet Take 1 tablet (40 mg total) by mouth at bedtime.   azelastine (ASTELIN) 0.1 % nasal spray Place 2 sprays into both nostrils 2 (two) times daily.   carvedilol (COREG) 25 MG tablet Take 1 tablet (25 mg total) by mouth 2 (two) times daily. (Patient taking differently: Take 25 mg by mouth 2 (two) times daily. 12.5 mg po qAM and 25 mg po qPM)   dapagliflozin propanediol (FARXIGA) 10 MG TABS tablet Take by mouth daily.   doxazosin (CARDURA) 8 MG tablet Take 1 tablet (8 mg total) by mouth in the morning AND 0.5 tablets (4 mg total) at bedtime.   dutasteride (AVODART) 0.5 MG capsule Take 1 capsule (0.5 mg total) by mouth daily.   fluticasone (FLONASE) 50 MCG/ACT nasal spray Place 1 spray into both nostrils 2 (two) times daily as needed.   levETIRAcetam (KEPPRA) 750 MG tablet TAKE 1 TABLET BY MOUTH TWICE A DAY   metolazone (ZAROXOLYN) 2.5 MG tablet Take 1 tablet (2.5 mg total)  by mouth every Monday, Wednesday, and Friday for 60 doses. (Patient taking differently: Take 2.5 mg by mouth as needed (weight gain).)   torsemide (DEMADEX) 10 MG tablet TAKE 1 TABLET (10 MG TOTAL) BY MOUTH EVERY MORNING.   [DISCONTINUED] aspirin EC 81 MG tablet Take 1 tablet (81 mg total) by mouth daily. Swallow whole.     PAST MEDICAL HISTORY: Past Medical History:  Diagnosis Date    Allergy    Enlarged prostate    Gout    Hyperlipidemia    Hypertension    Osteoarthrosis, unspecified whether generalized or localized, unspecified site    Seizures (HCC)    history of 1 a year ago    PAST SURGICAL HISTORY: Past Surgical History:  Procedure Laterality Date   no surgical h/o  June 2014    FAMILY HISTORY: The patient family history includes Healthy in his child and sister; Heart Problems in his mother; Hypertension in his mother.  SOCIAL HISTORY:  The patient  reports that he has never smoked. He has never used smokeless tobacco. He reports that he does not currently use alcohol. He reports that he does not use drugs.  REVIEW OF SYSTEMS: Review of Systems  Cardiovascular:  Positive for dyspnea on exertion (Chronic and stable) and leg swelling (Chronic and stable). Negative for chest pain, claudication, irregular heartbeat, near-syncope, orthopnea, palpitations, paroxysmal nocturnal dyspnea and syncope.  Respiratory:  Positive for shortness of breath (Chronic and stable).   Hematologic/Lymphatic: Negative for bleeding problem.  Musculoskeletal:  Negative for muscle cramps and myalgias.  Neurological:  Negative for dizziness and light-headedness.    PHYSICAL EXAM:    08/09/2022    3:11 PM 08/09/2022    2:39 PM 03/29/2022    3:00 PM  Vitals with BMI  Height  6\' 3"    Weight  212 lbs 10 oz   BMI  26.57   Systolic 135 143 027  Diastolic 82 92 61  Pulse 50 68 68    Physical Exam  Constitutional: No distress.  Age appropriate, hemodynamically stable.   Neck: No JVD present.  Cardiovascular: Normal rate, S1 normal, S2 normal, intact distal pulses and normal pulses. An irregularly irregular rhythm present. Exam reveals no gallop, no S3 and no S4.  Murmur heard. Holosystolic murmur is present with a grade of 3/6 at the apex radiating to the axilla. Pulmonary/Chest: Effort normal and breath sounds normal. No stridor. He has no wheezes. He has no rales.   Abdominal: Soft. Bowel sounds are normal. He exhibits no distension. There is no abdominal tenderness.  Musculoskeletal:        General: No edema (Wears compression stockings.).     Cervical back: Neck supple.  Neurological: He is alert and oriented to person, place, and time. He has intact cranial nerves (2-12).  Skin: Skin is warm and moist.   CARDIAC DATABASE: EKG: 04/03/2021: NSR, 66 bpm, first-degree AV block, left axis, possible old anteroseptal infarct, nonspecific T wave changes.  August 09, 2022: Atrial fibrillation, 61 bpm, right bundle branch block, left axis, left anterior fascicular block. Compared to prior tracing 10/07/2021 sinus with frequent PACs is now transitioned to A-fib  Echocardiogram: 04/10/2021:  Left ventricle cavity is normal in size. Severe concentric hypertrophy of the left ventricle. Speckled pattern suggests infiltrative cardiomyopathy.  Hypokinetic global wall motion. Doppler evidence of grade I (impaired) diastolic dysfunction, normal LAP. Severely depressed LV systolic function with visual EF 25-30%. Calculated EF 30%.  Left atrial cavity is severely dilated at 5.2 cm.  Right atrial cavity is mildly dilated.  Right ventricle cavity is normal in size. Mild concentric hypertrophy of the right ventricle. Severely reduced right ventricular function.  Trileaflet aortic valve.  Trace aortic regurgitation.  Structurally normal mitral valve.  Moderate (Grade II) mitral regurgitation.  Structurally normal tricuspid valve.  Moderate tricuspid regurgitation.  Severe pulmonary hypertension. RVSP measures 72 mmHg.  Pericardium is normal. Insignificant pericardial effusion.  IVC is dilated with poor inspiration collapse consistent with elevated right atrial pressure.   Stress Testing: Exercise/Lexiscan Tetrofosmin stress test 05/06/2021: Exercise nuclear stress test was performed using Bruce protocol. Patient reached 5.6 METS, and 71% of age predicted maximum heart rate.  Because of sub-maximal HR the patient was injected with 0.4 mg of Intravenous Lexiscan over 15 sec. Exercise capacity was low. No chest pain reported. Dyspnea and dizziness reported. Blunted heart rate response. Unable to comment on post exercise blood pressure, as reported blood pressure after stress is only after Lexiscan injection. Stress EKG at 71% MPHR showed sinus rhythm, LAFB, occasional PVC, no ischemic changes.  Dilated LV cavity with moderate global decrease in myocardial wall motion and thickening. Stress LVEF 41%. SPECT images show small area of mild intensity, fixed perfusion defect in basal inferior myocardium. No definite evidence of ischemia. High risk study in the setting of dilated cardiomyopathy.   Heart Catheterization: None  CT Cardiac Scoring 04/13/2021 1. Coronary calcium score is 1130 and this is at percentile 93 for patients of the same age, gender and ethnicity.  2. Nonspecific patchy densities at the lung bases which could represent combination of chronic changes and/or atelectasis. Inaddition, there are small indeterminate nodule, largest measures 5 mm. No follow-up needed if patient is low-risk (and has no known or suspected primary neoplasm). Non-contrast chest CT can be considered in 12 months if patient is high-risk. This recommendation follows the consensus statement: Guidelines for Management of Incidental Pulmonary Nodules Detected on CT Images: From the Fleischner Society 2017; Radiology 2017; 284:228-243.  3. Sclerotic focus involving a thoracic vertebral body. This has benign characteristics but recommend correlation with PSA level based on history of prostate cancer.  4. Mild cardiomegaly.  Cardiac monitor (Zio Patch): 10/07/2021 - 10/21/2021 Dominant rhythm normal sinus rhythm, first-degree AV block, IVCD, followed by bradycardia burden (22%). Heart rate 33-197 bpm.  Avg HR 55 bpm. No atrial fibrillation, high grade AV block, pauses (3 seconds or  longer). Total ventricular ectopic burden <1%. Episodes of atrial tachycardia with variable complete.   Total supraventricular ectopic burden 19.8% (predominantly isolated beats). Episodes of asymptomatic NSVT - the fastest interval of 15.8 seconds with a max HR of 197bpm.  Patient triggered events: 0.  LABORATORY DATA: External Labs: Collected: December 01, 2020 Sodium 144, potassium 4.9, chloride 108, bicarb 26. AST 17, ALT 12, alkaline phosphatase 82. Hemoglobin 12 g/dL, hematocrit 82.9%. BNP 362. NT proBNP 2741. Hemoglobin A1c 6  Collected: February 19, 2021. BUN 36, creatinine 2.19 mg/dL. eGFR 31 Sodium 144, potassium 4.5, chloride 109, bicarbonate 25. AST 16, ALT 13, alkaline phosphatase 81. PSA 15. NT proBNP 3304. Uric acid 7.7     Latest Ref Rng & Units 03/27/2013   10:12 AM 08/06/2011    4:57 AM 06/08/2011    5:32 AM  CBC  WBC 4.0 - 10.5 K/uL  10.6  7.7   Hemoglobin 13.0 - 17.0 g/dL 56.2  13.0  86.5   Hematocrit 39.0 - 52.0 % 41.0  37.7  32.9   Platelets 150 - 400 K/uL  158  151  Latest Ref Rng & Units 02/15/2022    8:47 AM 09/18/2021    9:21 AM 09/04/2021    8:15 AM  CMP  Glucose 70 - 99 mg/dL 161  096  045   BUN 8 - 27 mg/dL 42  44  46   Creatinine 0.76 - 1.27 mg/dL 4.09  8.11  9.14   Sodium 134 - 144 mmol/L 145  142  143   Potassium 3.5 - 5.2 mmol/L 3.8  3.6  3.5   Chloride 96 - 106 mmol/L 107  106  103   CO2 20 - 29 mmol/L 21  21  22    Calcium 8.6 - 10.2 mg/dL 9.2  8.7  9.0   Total Protein 6.0 - 8.5 g/dL 7.0     Total Bilirubin 0.0 - 1.2 mg/dL 2.0     Alkaline Phos 44 - 121 IU/L 121     AST 0 - 40 IU/L 24     ALT 0 - 44 IU/L 19       Lipid Panel     Component Value Date/Time   CHOL 136 02/15/2022 0847   TRIG 56 02/15/2022 0847   HDL 57 02/15/2022 0847   LDLCALC 67 02/15/2022 0847   LDLDIRECT 74 02/15/2022 0847   LABVLDL 12 02/15/2022 0847    No components found for: "NTPROBNP" Recent Labs    08/11/21 0841 08/19/21 0912  08/26/21 0813 09/04/21 0815 09/18/21 0921 02/15/22 0847  PROBNP 8,349* 7,500* 7,398* 6,003* 5,985* 7,437*   No results for input(s): "TSH" in the last 8760 hours.  BMP Recent Labs    09/04/21 0815 09/18/21 0921 02/15/22 0847  NA 143 142 145*  K 3.5 3.6 3.8  CL 103 106 107*  CO2 22 21 21   GLUCOSE 106* 114* 113*  BUN 46* 44* 42*  CREATININE 2.31* 2.18* 2.18*  CALCIUM 9.0 8.7 9.2    HEMOGLOBIN A1C Lab Results  Component Value Date   HGBA1C 6.0 11/30/2011   MPG 134 (H) 06/07/2011    Cardiac Panel (last 3 results) No results for input(s): "CKTOTAL", "CKMB", "TROPONINIHS", "RELINDX" in the last 72 hours.  CHOLESTEROL Recent Labs    02/15/22 0847  CHOL 136    Hepatic Function Panel Recent Labs    02/15/22 0847  PROT 7.0  ALBUMIN 4.0  AST 24  ALT 19  ALKPHOS 121  BILITOT 2.0*   IMPRESSION:    ICD-10-CM   1. New onset a-fib (HCC)  I48.91 Hemoglobin and hematocrit, blood    Basic metabolic panel    Magnesium    apixaban (ELIQUIS) 5 MG TABS tablet    2. Chronic combined systolic and diastolic heart failure (HCC)  N82.95 EKG 12-Lead    Hemoglobin and hematocrit, blood    Basic metabolic panel    Magnesium    3. Cardiomyopathy, unspecified type (HCC)  I42.9 Hemoglobin and hematocrit, blood    Basic metabolic panel    Magnesium    4. Coronary atherosclerosis due to calcified coronary lesion  I25.10    I25.84     5. Hypercholesteremia  E78.00     6. Elevated Lp(a)  E78.41     7. Central sleep apnea  G47.31     8. Prostate cancer [facility])  C61        RECOMMENDATIONS: Joel Duarte is a 77 y.o. African-American male whose past medical history and cardiac risk factors include: New onset of atrial fibrillation, cardiomyopathy, chronic combined systolic and diastolic heart failure, severe coronary artery calcification (total  CAC 1130/93rd percentile), elevated LP(a), hypertension with chronic kidney disease stage IIIb, erectile dysfunction, gout, history  of seizures, central sleep apnea, prediabetes, prostate cancer, pulmonary nodules, advanced age.  New onset of atrial fibrillation: Newly discovered. Rate control: Carvedilol. Rhythm control: N/A. Thromboembolic prophylaxis: Eliquis.  No prior history of bleeding. Risks, benefits, and alternatives to oral anticoagulation discussed and he is aware of his annual risk of thromboembolic event as per CHA2DS2-VASc score.  Click Here to Calculate/Change CHADS2VASc Score The patient's CHADS2-VASc score is 4, indicating a 4.8% annual risk of stroke.  Therefore, anticoagulation is recommended.   CHF History: Yes HTN History: No Diabetes History: No Stroke History: No Vascular Disease History: Yes Check baseline H&H and renal function. 2 weeks of samples for Eliquis provided.  Patient assistance form provided.  Chronic combined systolic and diastolic heart failure (HCC) Cardiomyopathy, unspecified type (HCC) Stage B, NYHA class II. Home blood pressure/weight log reviewed.  Ideal weight is 204-206 pounds.  His home weight recently has been between 207-208 pounds. Once the weight is more than 210 pounds he does use metolazone on a as needed basis. Entresto discontinued in the past due to worsening renal function. Spironolactone discontinued in the past secondary to feeling tired/fatigued/worn out Stopped isosorbide dinitrate and hydralazine because he could not tolerate it. Uptitration of GDMT has been difficult due to medication intolerances/renal function. Patient understands that he has underlying CAD given his coronary calcium score, high risk stress test, and cardiomyopathy; therefore, if he has new onset of chest pain or heart failure exacerbation he should go to the closest hospital via EMS for further evaluation and management.  Coronary atherosclerosis due to calcified coronary lesion Total CAC 1130, 93rd percentile. Continue statin therapy. Discontinue aspirin as we are initiating  anticoagulation for A-fib. MPI results as discussed above.  No anginal discomfort or heart failure symptoms. However, given his cardiomyopathy / HF he is at higher risk for chest pain / ACS and is educated on seeking medical attention sooner by going to the closest ER via EMS if he has new onset of chest pain or worsening of his heart failure symptoms. Patient verbalized understanding.  Hypercholesteremia / Elevated Lp(a) Currently on atorvastatin.   Initial LDL 117 mg/dL in the last LP(a) 161.0 With up titration of statin therapy his most recent LDL was 74 mg/dL.  Sleep apnea on CPAP Reemphasized the importance of CPAP compliance. Currently managed by [facility] sleep medicine.  Prostate cancer [facility]) We will defer management to urology/PCP.  Pulmonary nodule Defer management to PCP.  Given the degree of chronic kidney disease it is my best judgment that he should establish care with nephrology.  I offered to place a referral but he refuses.  He states that he will discuss it further with PCP.  FINAL MEDICATION LIST END OF ENCOUNTER: Meds ordered this encounter  Medications   apixaban (ELIQUIS) 5 MG TABS tablet    Sig: Take 1 tablet (5 mg total) by mouth 2 (two) times daily.    Dispense:  180 tablet    Refill:  0     Current Outpatient Medications:    allopurinol (ZYLOPRIM) 100 MG tablet, Take 1 tablet (100 mg total) by mouth daily., Disp: 90 tablet, Rfl: 2   apixaban (ELIQUIS) 5 MG TABS tablet, Take 1 tablet (5 mg total) by mouth 2 (two) times daily., Disp: 180 tablet, Rfl: 0   atorvastatin (LIPITOR) 40 MG tablet, Take 1 tablet (40 mg total) by mouth at bedtime., Disp: 90 tablet, Rfl:  0   azelastine (ASTELIN) 0.1 % nasal spray, Place 2 sprays into both nostrils 2 (two) times daily., Disp: 30 mL, Rfl: 11   carvedilol (COREG) 25 MG tablet, Take 1 tablet (25 mg total) by mouth 2 (two) times daily. (Patient taking differently: Take 25 mg by mouth 2 (two) times daily. 12.5 mg po qAM and 25  mg po qPM), Disp: 180 tablet, Rfl: 2   dapagliflozin propanediol (FARXIGA) 10 MG TABS tablet, Take by mouth daily., Disp: , Rfl:    doxazosin (CARDURA) 8 MG tablet, Take 1 tablet (8 mg total) by mouth in the morning AND 0.5 tablets (4 mg total) at bedtime., Disp: 135 tablet, Rfl: 2   dutasteride (AVODART) 0.5 MG capsule, Take 1 capsule (0.5 mg total) by mouth daily., Disp: 90 capsule, Rfl: 3   fluticasone (FLONASE) 50 MCG/ACT nasal spray, Place 1 spray into both nostrils 2 (two) times daily as needed., Disp: 16 g, Rfl: 5   levETIRAcetam (KEPPRA) 750 MG tablet, TAKE 1 TABLET BY MOUTH TWICE A DAY, Disp: 180 tablet, Rfl: 0   metolazone (ZAROXOLYN) 2.5 MG tablet, Take 1 tablet (2.5 mg total) by mouth every Monday, Wednesday, and Friday for 60 doses. (Patient taking differently: Take 2.5 mg by mouth as needed (weight gain).), Disp: 12 tablet, Rfl: 4   torsemide (DEMADEX) 10 MG tablet, TAKE 1 TABLET (10 MG TOTAL) BY MOUTH EVERY MORNING., Disp: 90 tablet, Rfl: 0   azelastine (ASTELIN) 0.1 % nasal spray, USE ONE PUFF IN EACH NOSTRIL TWICE A DAY AS NEEDED, Disp: 30 mL, Rfl: 6  Orders Placed This Encounter  Procedures   Hemoglobin and hematocrit, blood   Basic metabolic panel   Magnesium   EKG 12-Lead    There are no Patient Instructions on file for this visit.   --Continue cardiac medications as reconciled in final medication list. --Return in about 4 weeks (around 09/06/2022) for Follow up, A. fib. Or sooner if needed. --Continue follow-up with your primary care physician regarding the management of your other chronic comorbid conditions.  Patient's questions and concerns were addressed to his satisfaction. He voices understanding of the instructions provided during this encounter.   This note was created using a voice recognition software as a result there may be grammatical errors inadvertently enclosed that do not reflect the nature of this encounter. Every attempt is made to correct such  errors.  Tessa Lerner, [place], [facility]  Pager:  [phone] Office: [phone]

## 2022-08-11 LAB — BASIC METABOLIC PANEL
BUN/Creatinine Ratio: 23 (ref 10–24)
BUN: 54 mg/dL — ABNORMAL HIGH (ref 8–27)
CO2: 21 mmol/L (ref 20–29)
Calcium: 9.4 mg/dL (ref 8.6–10.2)
Chloride: 103 mmol/L (ref 96–106)
Creatinine, Ser: 2.4 mg/dL — ABNORMAL HIGH (ref 0.76–1.27)
Glucose: 112 mg/dL — ABNORMAL HIGH (ref 70–99)
Potassium: 3.5 mmol/L (ref 3.5–5.2)
Sodium: 141 mmol/L (ref 134–144)
eGFR: 27 mL/min/{1.73_m2} — ABNORMAL LOW (ref >59)

## 2022-08-11 LAB — HEMOGLOBIN AND HEMATOCRIT, BLOOD
Hematocrit: 38.6 % (ref 37.5–51.0)
Hemoglobin: 11.9 g/dL — ABNORMAL LOW (ref 13.0–17.7)

## 2022-08-11 LAB — MAGNESIUM: Magnesium: 2.4 mg/dL — ABNORMAL HIGH (ref 1.6–2.3)

## 2022-08-12 ENCOUNTER — Other Ambulatory Visit: Payer: Self-pay | Admitting: Neurology

## 2022-08-12 ENCOUNTER — Other Ambulatory Visit: Payer: Self-pay

## 2022-08-12 DIAGNOSIS — I4891 Unspecified atrial fibrillation: Secondary | ICD-10-CM

## 2022-08-12 DIAGNOSIS — G40109 Localization-related (focal) (partial) symptomatic epilepsy and epileptic syndromes with simple partial seizures, not intractable, without status epilepticus: Secondary | ICD-10-CM

## 2022-08-12 MED ORDER — APIXABAN 5 MG PO TABS: 5.0000 mg | ORAL_TABLET | Freq: Two times a day (BID) | ORAL | 0 refills | Status: DC

## 2022-08-16 NOTE — Progress Notes (Signed)
Called patient no answer left a vm

## 2022-08-17 NOTE — Progress Notes (Signed)
Called patient, Na, LMAM.

## 2022-09-09 ENCOUNTER — Encounter: Payer: Self-pay | Admitting: Cardiology

## 2022-09-09 ENCOUNTER — Ambulatory Visit: Payer: Medicare HMO | Admitting: Cardiology

## 2022-09-09 VITALS — BP 127/83 | HR 69 | Resp 16 | Ht 75.0 in | Wt 212.0 lb

## 2022-09-09 DIAGNOSIS — I4819 Other persistent atrial fibrillation: Secondary | ICD-10-CM

## 2022-09-09 DIAGNOSIS — I2510 Atherosclerotic heart disease of native coronary artery without angina pectoris: Secondary | ICD-10-CM

## 2022-09-09 DIAGNOSIS — C61 Malignant neoplasm of prostate: Secondary | ICD-10-CM

## 2022-09-09 DIAGNOSIS — E7841 Elevated Lipoprotein(a): Secondary | ICD-10-CM

## 2022-09-09 DIAGNOSIS — G4731 Primary central sleep apnea: Secondary | ICD-10-CM

## 2022-09-09 DIAGNOSIS — I429 Cardiomyopathy, unspecified: Secondary | ICD-10-CM

## 2022-09-09 DIAGNOSIS — E7800 Pure hypercholesterolemia, unspecified: Secondary | ICD-10-CM

## 2022-09-09 DIAGNOSIS — I5042 Chronic combined systolic (congestive) and diastolic (congestive) heart failure: Secondary | ICD-10-CM

## 2022-09-09 NOTE — Progress Notes (Signed)
ID:  Joel Duarte, DOB January 30, 1945, MRN [id number]  PCP:  [name], MD  Cardiologist:  Tessa Lerner, DO, [facility] (established care April 03, 2021)  Date: 09/09/22 Last Office Visit: 08/09/2022  Chief Complaint  Patient presents with   Follow-up   Atrial Fibrillation    HPI  Joel Duarte is a 77 y.o. African-American male whose past medical history and cardiovascular risk factors include: Newly discovered atrial fibrillation, cardiomyopathy, chronic combined systolic and diastolic heart failure, severe coronary artery calcification (total CAC 1130/93rd percentile), elevated LP(a), hypertension with chronic kidney disease stage 3b/4, erectile dysfunction, gout, history of seizures, central sleep apnea on CPAP, prediabetes, prostate cancer, pulmonary nodules, advanced age.  Due to his severe coronary artery calcification he did undergo ischemic work-up which included a stress test in April 2023.  His stress test was reported to be high risk due to reduced LVEF, dilated LV cavity, but no definitive evidence of reversible ischemia.  Given his progression of CKD and being asymptomatic (no anginal discomfort) shared decision was to monitor him clinically to prevent the risk of worsening renal function leading to dialysis.  However, on multiple office visits he has been educated that if he develops chest pain concerning for anginal discomfort he needs to seek medical attention by going to the closest ER via EMS.  If he has symptoms of ACS angiography is warranted irrespective of the renal function.  During his last office visit in July 2024 EKG confirmed presence of atrial fibrillation and patient was started on anticoagulation for thromboembolic prophylaxis.  Based on his renal function is on the correct dose of anticoagulation.  He does not endorse any evidence of bleeding.  No prior history of gastrointestinal bleeding or intracranial bleeding.  Clinically he denies anginal chest pain or heart failure  symptoms.  Patient states that as long as his weight is around 204-206 pounds at home he remains asymptomatic.  If and when his weight is above 210 pounds he usually uses metolazone on as needed basis for volume management.  FUNCTIONAL STATUS: No structured exercise program or daily routine.   ALLERGIES: No Known Allergies  MEDICATION LIST PRIOR TO VISIT: Current Meds  Medication Sig   allopurinol (ZYLOPRIM) 100 MG tablet Take 1 tablet (100 mg total) by mouth daily.   apixaban (ELIQUIS) 5 MG TABS tablet Take 1 tablet (5 mg total) by mouth 2 (two) times daily.   atorvastatin (LIPITOR) 40 MG tablet Take 1 tablet (40 mg total) by mouth at bedtime.   azelastine (ASTELIN) 0.1 % nasal spray USE ONE PUFF IN EACH NOSTRIL TWICE A DAY AS NEEDED   azelastine (ASTELIN) 0.1 % nasal spray Place 2 sprays into both nostrils 2 (two) times daily.   carvedilol (COREG) 25 MG tablet Take 1 tablet (25 mg total) by mouth 2 (two) times daily. (Patient taking differently: Take 25 mg by mouth 2 (two) times daily. 12.5 mg po qAM and 25 mg po qPM)   dapagliflozin propanediol (FARXIGA) 10 MG TABS tablet Take by mouth daily.   doxazosin (CARDURA) 8 MG tablet Take 1 tablet (8 mg total) by mouth in the morning AND 0.5 tablets (4 mg total) at bedtime.   dutasteride (AVODART) 0.5 MG capsule Take 1 capsule (0.5 mg total) by mouth daily.   fluticasone (FLONASE) 50 MCG/ACT nasal spray Place 1 spray into both nostrils 2 (two) times daily as needed.   levETIRAcetam (KEPPRA) 750 MG tablet TAKE 1 TABLET BY MOUTH TWICE A DAY   metolazone (ZAROXOLYN)  2.5 MG tablet Take 1 tablet (2.5 mg total) by mouth every Monday, Wednesday, and Friday for 60 doses. (Patient taking differently: Take 2.5 mg by mouth as needed (weight gain).)   torsemide (DEMADEX) 10 MG tablet TAKE 1 TABLET (10 MG TOTAL) BY MOUTH EVERY MORNING.     PAST MEDICAL HISTORY: Past Medical History:  Diagnosis Date   Allergy    Atrial fibrillation (HCC)    Enlarged  prostate    Gout    Hyperlipidemia    Hypertension    Osteoarthrosis, unspecified whether generalized or localized, unspecified site    Seizures (HCC)    history of 1 a year ago    PAST SURGICAL HISTORY: Past Surgical History:  Procedure Laterality Date   no surgical h/o  June 2014    FAMILY HISTORY: The patient family history includes Healthy in his child and sister; Heart Problems in his mother; Hypertension in his mother.  SOCIAL HISTORY:  The patient  reports that he has never smoked. He has never used smokeless tobacco. He reports that he does not currently use alcohol. He reports that he does not use drugs.  REVIEW OF SYSTEMS: Review of Systems  Cardiovascular:  Positive for dyspnea on exertion (Chronic and stable) and leg swelling (Chronic and stable). Negative for chest pain, claudication, irregular heartbeat, near-syncope, orthopnea, palpitations, paroxysmal nocturnal dyspnea and syncope.  Respiratory:  Positive for shortness of breath (Chronic and stable).   Hematologic/Lymphatic: Negative for bleeding problem.  Musculoskeletal:  Negative for muscle cramps and myalgias.  Neurological:  Negative for dizziness and light-headedness.    PHYSICAL EXAM:    09/09/2022    3:19 PM 08/09/2022    3:11 PM 08/09/2022    2:39 PM  Vitals with BMI  Height 6\' 3"   6\' 3"   Weight 212 lbs  212 lbs 10 oz  BMI 26.5  26.57  Systolic 127 135 732  Diastolic 83 82 92  Pulse 69 50 68    Physical Exam  Constitutional: No distress.  Age appropriate, hemodynamically stable.   Neck: No JVD present.  Cardiovascular: Normal rate, S1 normal, S2 normal, intact distal pulses and normal pulses. An irregularly irregular rhythm present. Exam reveals no gallop, no S3 and no S4.  Murmur heard. Holosystolic murmur is present with a grade of 3/6 at the apex radiating to the axilla. Pulmonary/Chest: Effort normal and breath sounds normal. No stridor. He has no wheezes. He has no rales.  Abdominal:  Soft. Bowel sounds are normal. He exhibits no distension. There is no abdominal tenderness.  Musculoskeletal:        General: No edema (Wears compression stockings.).     Cervical back: Neck supple.  Neurological: He is alert and oriented to person, place, and time. He has intact cranial nerves (2-12).  Skin: Skin is warm and moist.   CARDIAC DATABASE: EKG: 04/03/2021: NSR, 66 bpm, first-degree AV block, left axis, possible old anteroseptal infarct, nonspecific T wave changes.  August 09, 2022: Atrial fibrillation, 61 bpm, right bundle branch block, left axis, left anterior fascicular block. Compared to prior tracing 10/07/2021 sinus with frequent PACs is now transitioned to A-fib  09/09/2022: Atrial fibrillation, 68 bpm, left axis, left anterior fascicular block, nonspecific T wave abnormality.  Echocardiogram: 04/10/2021:  Left ventricle cavity is normal in size. Severe concentric hypertrophy of the left ventricle. Speckled pattern suggests infiltrative cardiomyopathy.  Hypokinetic global wall motion. Doppler evidence of grade I (impaired) diastolic dysfunction, normal LAP. Severely depressed LV systolic function with visual EF  25-30%. Calculated EF 30%.  Left atrial cavity is severely dilated at 5.2 cm. Right atrial cavity is mildly dilated.  Right ventricle cavity is normal in size. Mild concentric hypertrophy of the right ventricle. Severely reduced right ventricular function.  Trileaflet aortic valve.  Trace aortic regurgitation.  Structurally normal mitral valve.  Moderate (Grade II) mitral regurgitation.  Structurally normal tricuspid valve.  Moderate tricuspid regurgitation.  Severe pulmonary hypertension. RVSP measures 72 mmHg.  Pericardium is normal. Insignificant pericardial effusion.  IVC is dilated with poor inspiration collapse consistent with elevated right atrial pressure.   Stress Testing: Exercise/Lexiscan Tetrofosmin stress test 05/06/2021: Exercise nuclear stress test was  performed using Bruce protocol. Patient reached 5.6 METS, and 71% of age predicted maximum heart rate. Because of sub-maximal HR the patient was injected with 0.4 mg of Intravenous Lexiscan over 15 sec. Exercise capacity was low. No chest pain reported. Dyspnea and dizziness reported. Blunted heart rate response. Unable to comment on post exercise blood pressure, as reported blood pressure after stress is only after Lexiscan injection. Stress EKG at 71% MPHR showed sinus rhythm, LAFB, occasional PVC, no ischemic changes.  Dilated LV cavity with moderate global decrease in myocardial wall motion and thickening. Stress LVEF 41%. SPECT images show small area of mild intensity, fixed perfusion defect in basal inferior myocardium. No definite evidence of ischemia. High risk study in the setting of dilated cardiomyopathy.   Heart Catheterization: None  CT Cardiac Scoring 04/13/2021 1. Coronary calcium score is 1130 and this is at percentile 93 for patients of the same age, gender and ethnicity.  2. Nonspecific patchy densities at the lung bases which could represent combination of chronic changes and/or atelectasis. Inaddition, there are small indeterminate nodule, largest measures 5 mm. No follow-up needed if patient is low-risk (and has no known or suspected primary neoplasm). Non-contrast chest CT can be considered in 12 months if patient is high-risk. This recommendation follows the consensus statement: Guidelines for Management of Incidental Pulmonary Nodules Detected on CT Images: From the Fleischner Society 2017; Radiology 2017; 284:228-243.  3. Sclerotic focus involving a thoracic vertebral body. This has benign characteristics but recommend correlation with PSA level based on history of prostate cancer.  4. Mild cardiomegaly.  Cardiac monitor (Zio Patch): 10/07/2021 - 10/21/2021 Dominant rhythm normal sinus rhythm, first-degree AV block, IVCD, followed by bradycardia burden (22%). Heart rate  33-197 bpm.  Avg HR 55 bpm. No atrial fibrillation, high grade AV block, pauses (3 seconds or longer). Total ventricular ectopic burden <1%. Episodes of atrial tachycardia with variable complete.   Total supraventricular ectopic burden 19.8% (predominantly isolated beats). Episodes of asymptomatic NSVT - the fastest interval of 15.8 seconds with a max HR of 197bpm.  Patient triggered events: 0.  LABORATORY DATA: External Labs: Collected: December 01, 2020 Sodium 144, potassium 4.9, chloride 108, bicarb 26. AST 17, ALT 12, alkaline phosphatase 82. Hemoglobin 12 g/dL, hematocrit 86.5%. BNP 362. NT proBNP 2741. Hemoglobin A1c 6  Collected: February 19, 2021. BUN 36, creatinine 2.19 mg/dL. eGFR 31 Sodium 144, potassium 4.5, chloride 109, bicarbonate 25. AST 16, ALT 13, alkaline phosphatase 81. PSA 15. NT proBNP 3304. Uric acid 7.7     Latest Ref Rng & Units 08/10/2022    8:57 AM 03/27/2013   10:12 AM 08/06/2011    4:57 AM  CBC  WBC 4.0 - 10.5 K/uL   10.6   Hemoglobin 13.0 - 17.7 g/dL 78.4  69.6  29.5   Hematocrit 37.5 - 51.0 % 38.6  41.0  37.7   Platelets 150 - 400 K/uL   158        Latest Ref Rng & Units 08/10/2022    8:57 AM 02/15/2022    8:47 AM 09/18/2021    9:21 AM  CMP  Glucose 70 - 99 mg/dL 161  096  045   BUN 8 - 27 mg/dL 54  42  44   Creatinine 0.76 - 1.27 mg/dL 4.09  8.11  9.14   Sodium 134 - 144 mmol/L 141  145  142   Potassium 3.5 - 5.2 mmol/L 3.5  3.8  3.6   Chloride 96 - 106 mmol/L 103  107  106   CO2 20 - 29 mmol/L 21  21  21    Calcium 8.6 - 10.2 mg/dL 9.4  9.2  8.7   Total Protein 6.0 - 8.5 g/dL  7.0    Total Bilirubin 0.0 - 1.2 mg/dL  2.0    Alkaline Phos 44 - 121 IU/L  121    AST 0 - 40 IU/L  24    ALT 0 - 44 IU/L  19      Lipid Panel     Component Value Date/Time   CHOL 136 02/15/2022 0847   TRIG 56 02/15/2022 0847   HDL 57 02/15/2022 0847   LDLCALC 67 02/15/2022 0847   LDLDIRECT 74 02/15/2022 0847   LABVLDL 12 02/15/2022 0847    No  components found for: "NTPROBNP" Recent Labs    09/18/21 0921 02/15/22 0847  PROBNP 5,985* 7,437*   No results for input(s): "TSH" in the last 8760 hours.  BMP Recent Labs    09/18/21 0921 02/15/22 0847 08/10/22 0857  NA 142 145* 141  K 3.6 3.8 3.5  CL 106 107* 103  CO2 21 21 21   GLUCOSE 114* 113* 112*  BUN 44* 42* 54*  CREATININE 2.18* 2.18* 2.40*  CALCIUM 8.7 9.2 9.4    HEMOGLOBIN A1C Lab Results  Component Value Date   HGBA1C 6.0 11/30/2011   MPG 134 (H) 06/07/2011    Cardiac Panel (last 3 results) No results for input(s): "CKTOTAL", "CKMB", "TROPONINIHS", "RELINDX" in the last 72 hours.  CHOLESTEROL Recent Labs    02/15/22 0847  CHOL 136    Hepatic Function Panel Recent Labs    02/15/22 0847  PROT 7.0  ALBUMIN 4.0  AST 24  ALT 19  ALKPHOS 121  BILITOT 2.0*   IMPRESSION:    ICD-10-CM   1. Persistent atrial fibrillation (HCC)  I48.19 EKG 12-Lead    2. Chronic combined systolic and diastolic heart failure (HCC)  N82.95     3. Cardiomyopathy, unspecified type (HCC)  I42.9     4. Coronary atherosclerosis due to calcified coronary lesion  I25.10    I25.84     5. Hypercholesteremia  E78.00     6. Elevated Lp(a)  E78.41     7. Central sleep apnea  G47.31     8. Prostate cancer [facility])  C61        RECOMMENDATIONS: Joel Duarte is a 77 y.o. African-American male whose past medical history and cardiac risk factors include: New onset of atrial fibrillation, cardiomyopathy, chronic combined systolic and diastolic heart failure, severe coronary artery calcification (total CAC 1130/93rd percentile), elevated LP(a), hypertension with chronic kidney disease stage IIIb, erectile dysfunction, gout, history of seizures, central sleep apnea, prediabetes, prostate cancer, pulmonary nodules, advanced age.  Persistent atrial fibrillation: Rate control: Carvedilol. Rhythm control: N/A. Thromboembolic prophylaxis: Eliquis.  No prior history of  bleeding. Risks, benefits, and alternatives to oral anticoagulation discussed and he is aware of his annual risk of thromboembolic event as per CHA2DS2-VASc score.  Click Here to Calculate/Change CHADS2VASc Score The patient's CHADS2-VASc score is 4, indicating a 4.8% annual risk of stroke.  Therefore, anticoagulation is recommended.   CHF History: Yes HTN History: Yes Diabetes History: No Stroke History: No Vascular Disease History: No Labs as of July 2024-hemoglobin 11.9 g/dL and serum creatinine 2.4 Patient states that Eliquis may become cost prohibitive.  Did not qualify for patient assistance for Eliquis.  2 weeks of samples provided.  Have asked him to call his insurance company to see if Xarelto is on the formulary. Further recommendations to follow He has been on anticoagulation uninterrupted for 4 weeks.  We discussed the role of direct-current cardioversion to restore normal sinus rhythm.  After discussing risk, benefits, alternatives patient states that he would like to think about it before making his decision.  I have asked him to call the office back to come back sooner to address any questions or concerns he may have.  Restoring normal sinus rhythm would help improve his LVEF.   Chronic combined systolic and diastolic heart failure (HCC) Cardiomyopathy, unspecified type (HCC) Stage B, NYHA class II. Ideal weight is 204-206 pounds.  Once the weight is more than 210 pounds he does use metolazone on a as needed basis. Entresto discontinued in the past due to worsening renal function. Spironolactone discontinued in the past secondary to feeling tired/fatigued/worn out Stopped isosorbide dinitrate and hydralazine because he could not tolerate it. Uptitration of GDMT has been difficult due to medication intolerances/renal function. In addition, I have asked him to discuss w/ his urologist regarding reducing Cardura to the lowest dose needed to address his BPH symptoms. If his Cardura  is reduced I am hopefully he can tolerate / uptitrate GDMT. He was on 8mg  now on 4mg  (per patient).  Patient understands that he has underlying CAD given his coronary calcium score, high risk stress test, and cardiomyopathy; therefore, if he has new onset of chest pain or heart failure exacerbation he should go to the closest hospital via EMS for further evaluation and management. His abnormal nuclear stress test was in April 2023 and since then he has been managed medically and given his chronic kidney disease stage IV which places him at high risk of contrast-induced nephropathy and progression of chronic kidney disease  Coronary atherosclerosis due to calcified coronary lesion Total CAC 1130, 93rd percentile. Continue statin therapy. Discontinue aspirin as we are initiating anticoagulation for A-fib. MPI results as discussed above.  No anginal discomfort or heart failure symptoms. However, given his cardiomyopathy / HF he is at higher risk for chest pain / ACS and is educated on seeking medical attention sooner by going to the closest ER via EMS if he has new onset of chest pain or worsening of his heart failure symptoms. Patient verbalized understanding.  Hypercholesteremia / Elevated Lp(a) Currently on atorvastatin.   Initial LDL 117 mg/dL in the last LP(a) 191.4 With up titration of statin therapy his most recent LDL was 74 mg/dL.  Sleep apnea on CPAP Reemphasized the importance of CPAP compliance. Currently managed by [facility] sleep medicine.  Prostate cancer [facility]) We will defer management to urology/PCP.  FINAL MEDICATION LIST END OF ENCOUNTER: No orders of the defined types were placed in this encounter.    Current Outpatient Medications:    allopurinol (ZYLOPRIM) 100 MG tablet, Take 1 tablet (100 mg total)  by mouth daily., Disp: 90 tablet, Rfl: 2   apixaban (ELIQUIS) 5 MG TABS tablet, Take 1 tablet (5 mg total) by mouth 2 (two) times daily., Disp: 180 tablet, Rfl: 0    atorvastatin (LIPITOR) 40 MG tablet, Take 1 tablet (40 mg total) by mouth at bedtime., Disp: 90 tablet, Rfl: 0   azelastine (ASTELIN) 0.1 % nasal spray, USE ONE PUFF IN EACH NOSTRIL TWICE A DAY AS NEEDED, Disp: 30 mL, Rfl: 6   azelastine (ASTELIN) 0.1 % nasal spray, Place 2 sprays into both nostrils 2 (two) times daily., Disp: 30 mL, Rfl: 11   carvedilol (COREG) 25 MG tablet, Take 1 tablet (25 mg total) by mouth 2 (two) times daily. (Patient taking differently: Take 25 mg by mouth 2 (two) times daily. 12.5 mg po qAM and 25 mg po qPM), Disp: 180 tablet, Rfl: 2   dapagliflozin propanediol (FARXIGA) 10 MG TABS tablet, Take by mouth daily., Disp: , Rfl:    doxazosin (CARDURA) 8 MG tablet, Take 1 tablet (8 mg total) by mouth in the morning AND 0.5 tablets (4 mg total) at bedtime., Disp: 135 tablet, Rfl: 2   dutasteride (AVODART) 0.5 MG capsule, Take 1 capsule (0.5 mg total) by mouth daily., Disp: 90 capsule, Rfl: 3   fluticasone (FLONASE) 50 MCG/ACT nasal spray, Place 1 spray into both nostrils 2 (two) times daily as needed., Disp: 16 g, Rfl: 5   levETIRAcetam (KEPPRA) 750 MG tablet, TAKE 1 TABLET BY MOUTH TWICE A DAY, Disp: 180 tablet, Rfl: 0   metolazone (ZAROXOLYN) 2.5 MG tablet, Take 1 tablet (2.5 mg total) by mouth every Monday, Wednesday, and Friday for 60 doses. (Patient taking differently: Take 2.5 mg by mouth as needed (weight gain).), Disp: 12 tablet, Rfl: 4   torsemide (DEMADEX) 10 MG tablet, TAKE 1 TABLET (10 MG TOTAL) BY MOUTH EVERY MORNING., Disp: 90 tablet, Rfl: 0  Orders Placed This Encounter  Procedures   EKG 12-Lead    There are no Patient Instructions on file for this visit.   --Continue cardiac medications as reconciled in final medication list. --Return in about 6 months (around 03/12/2023) for Follow up, heart failure management, cadiomyopathy . Or sooner if needed. --Continue follow-up with your primary care physician regarding the management of your other chronic comorbid  conditions.  Patient's questions and concerns were addressed to his satisfaction. He voices understanding of the instructions provided during this encounter.   This note was created using a voice recognition software as a result there may be grammatical errors inadvertently enclosed that do not reflect the nature of this encounter. Every attempt is made to correct such errors.  Tessa Lerner, [place], [facility]  Pager:  [phone] Office: [phone]

## 2022-09-17 ENCOUNTER — Other Ambulatory Visit: Payer: Self-pay

## 2022-09-17 ENCOUNTER — Telehealth: Payer: Self-pay

## 2022-09-17 DIAGNOSIS — I4891 Unspecified atrial fibrillation: Secondary | ICD-10-CM

## 2022-09-17 MED ORDER — APIXABAN 5 MG PO TABS: 5.0000 mg | ORAL_TABLET | Freq: Two times a day (BID) | ORAL | 2 refills | Status: DC

## 2022-09-17 NOTE — Telephone Encounter (Signed)
Ok.. and he wants you to do 30 day instead of 90 day to make it cheaper

## 2022-09-17 NOTE — Telephone Encounter (Signed)
Please send in the script for 30 day supply 6 month.   Delbra Zellars [place], DO, [facility]

## 2022-09-17 NOTE — Telephone Encounter (Signed)
Patient called stating he's going to stay with ELIQUIS. Patient states in the last visit u and him disussed if he should stay on ELIQUIS or not

## 2022-09-17 NOTE — Telephone Encounter (Signed)
Patient was concerned about the cost of Eliquis and therefore we discussed considering an alternative like Xarelto if that medication was on his insurance formulary.  If patient is going to be on Eliquis please see if there is any assistance to help minimize his cost.  Tessa Lerner, DO, [facility]

## 2022-09-21 ENCOUNTER — Other Ambulatory Visit: Payer: Self-pay

## 2022-09-21 DIAGNOSIS — C61 Malignant neoplasm of prostate: Secondary | ICD-10-CM

## 2022-09-22 LAB — PSA: Prostate Specific Ag, Serum: 14.8 ng/mL — ABNORMAL HIGH (ref 0.0–4.0)

## 2022-09-22 NOTE — Telephone Encounter (Signed)
This encounter was created in error - please disregard.

## 2022-09-23 ENCOUNTER — Other Ambulatory Visit: Payer: Self-pay

## 2022-09-23 DIAGNOSIS — I4891 Unspecified atrial fibrillation: Secondary | ICD-10-CM

## 2022-09-23 MED ORDER — APIXABAN 5 MG PO TABS
5.0000 mg | ORAL_TABLET | Freq: Two times a day (BID) | ORAL | 6 refills | Status: DC
Start: 2022-09-23 — End: 2023-01-03

## 2022-09-27 ENCOUNTER — Ambulatory Visit: Payer: Medicare HMO | Admitting: Urology

## 2022-09-29 ENCOUNTER — Ambulatory Visit: Payer: Medicare HMO | Admitting: Urology

## 2022-09-29 VITALS — BP 131/75 | HR 66

## 2022-09-29 DIAGNOSIS — N401 Enlarged prostate with lower urinary tract symptoms: Secondary | ICD-10-CM | POA: Diagnosis not present

## 2022-09-29 DIAGNOSIS — R350 Frequency of micturition: Secondary | ICD-10-CM

## 2022-09-29 DIAGNOSIS — C61 Malignant neoplasm of prostate: Secondary | ICD-10-CM

## 2022-09-29 DIAGNOSIS — N138 Other obstructive and reflux uropathy: Secondary | ICD-10-CM

## 2022-09-29 LAB — URINALYSIS, ROUTINE W REFLEX MICROSCOPIC
Bilirubin, UA: NEGATIVE
Ketones, UA: NEGATIVE
Leukocytes,UA: NEGATIVE
Nitrite, UA: NEGATIVE
RBC, UA: NEGATIVE
Specific Gravity, UA: 1.02 (ref 1.005–1.030)
Urobilinogen, Ur: 0.2 mg/dL (ref 0.2–1.0)
pH, UA: 6 (ref 5.0–7.5)

## 2022-09-29 NOTE — Progress Notes (Signed)
09/29/2022 2:03 PM   [name] 07/22/1945 [id number]  Referring provider: [name], MD [address] [place],  [place] [address]  Followup prostate cancer and BPH   HPI: Mr Horman is a 77yo here for followup for prostate cancer and BPH. PSA increased to 14.8 from 14.1. He denies any worsening LUTS. IPSS 12 QOL 2 on cardura. He has urinary frequency every 3-4 hours. Nocturia 1-2x. Urine stream strong. NO straining to urinate.    PMH: Past Medical History:  Diagnosis Date   Allergy    Atrial fibrillation (HCC)    Enlarged prostate    Gout    Hyperlipidemia    Hypertension    Osteoarthrosis, unspecified whether generalized or localized, unspecified site    Seizures (HCC)    history of 1 a year ago    Surgical History: Past Surgical History:  Procedure Laterality Date   no surgical h/o  June 2014    Home Medications:  Allergies as of 09/29/2022   No Known Allergies      Medication List        Accurate as of September 29, 2022  2:03 PM. If you have any questions, ask your nurse or doctor.          allopurinol 100 MG tablet Commonly known as: ZYLOPRIM Take 1 tablet (100 mg total) by mouth daily.   apixaban 5 MG Tabs tablet Commonly known as: Eliquis Take 1 tablet (5 mg total) by mouth 2 (two) times daily.   atorvastatin 40 MG tablet Commonly known as: LIPITOR Take 1 tablet (40 mg total) by mouth at bedtime.   azelastine 0.1 % nasal spray Commonly known as: ASTELIN USE ONE PUFF IN EACH NOSTRIL TWICE A DAY AS NEEDED   azelastine 0.1 % nasal spray Commonly known as: ASTELIN Place 2 sprays into both nostrils 2 (two) times daily.   carvedilol 25 MG tablet Commonly known as: COREG Take 1 tablet (25 mg total) by mouth 2 (two) times daily. What changed: additional instructions   doxazosin 8 MG tablet Commonly known as: CARDURA Take 1 tablet (8 mg total) by mouth in the morning AND 0.5 tablets (4 mg total) at bedtime.    dutasteride 0.5 MG capsule Commonly known as: AVODART Take 1 capsule (0.5 mg total) by mouth daily.   Farxiga 10 MG Tabs tablet Generic drug: dapagliflozin propanediol Take by mouth daily.   fluticasone 50 MCG/ACT nasal spray Commonly known as: FLONASE Place 1 spray into both nostrils 2 (two) times daily as needed.   levETIRAcetam 750 MG tablet Commonly known as: KEPPRA TAKE 1 TABLET BY MOUTH TWICE A DAY   metolazone 2.5 MG tablet Commonly known as: ZAROXOLYN Take 1 tablet (2.5 mg total) by mouth every Monday, Wednesday, and Friday for 60 doses. What changed:  when to take this reasons to take this   torsemide 10 MG tablet Commonly known as: DEMADEX TAKE 1 TABLET (10 MG TOTAL) BY MOUTH EVERY MORNING.        Allergies: No Known Allergies  Family History: Family History  Problem Relation Age of Onset   Hypertension Mother    Heart Problems Mother    Healthy Sister    Healthy Child     Social History:  reports that he has never smoked. He has never used smokeless tobacco. He reports that he does not currently use alcohol. He reports that he does not use drugs.  ROS: All other review of systems were reviewed and are negative  except what is noted above in HPI  Physical Exam: BP 131/75   Pulse 66   Constitutional:  Alert and oriented, No acute distress. HEENT: [place] AT, moist mucus membranes.  Trachea midline, no masses. Cardiovascular: No clubbing, cyanosis, or edema. Respiratory: Normal respiratory effort, no increased work of breathing. GI: Abdomen is soft, nontender, nondistended, no abdominal masses GU: No CVA tenderness.  Lymph: No cervical or inguinal lymphadenopathy. Skin: No rashes, bruises or suspicious lesions. Neurologic: Grossly intact, no focal deficits, moving all 4 extremities. Psychiatric: Normal mood and affect.  Laboratory Data: Lab Results  Component Value Date   WBC 10.6 (H) 08/06/2011   HGB 11.9 (L) 08/10/2022   HCT 38.6 08/10/2022    MCV 79.0 08/06/2011   PLT 158 08/06/2011    Lab Results  Component Value Date   CREATININE 2.40 (H) 08/10/2022    No results found for: "PSA"  No results found for: "TESTOSTERONE"  Lab Results  Component Value Date   HGBA1C 6.0 11/30/2011    Urinalysis    Component Value Date/Time   COLORURINE YELLOW 06/07/2011 0856   APPEARANCEUR Clear 03/29/2022 1559   LABSPEC 1.013 06/07/2011 0856   PHURINE 5.0 06/07/2011 0856   GLUCOSEU 2+ (A) 03/29/2022 1559   HGBUR TRACE (A) 06/07/2011 0856   BILIRUBINUR Negative 03/29/2022 1559   KETONESUR NEGATIVE 06/07/2011 0856   PROTEINUR 1+ (A) 03/29/2022 1559   PROTEINUR 30 (A) 06/07/2011 0856   UROBILINOGEN 0.2 06/07/2011 0856   NITRITE Negative 03/29/2022 1559   NITRITE NEGATIVE 06/07/2011 0856   LEUKOCYTESUR Negative 03/29/2022 1559    Lab Results  Component Value Date   LABMICR See below: 03/29/2022   WBCUA 0-5 03/29/2022   LABEPIT 0-10 03/29/2022   MUCUS Present 09/28/2021   BACTERIA None seen 03/29/2022    Pertinent Imaging:  No results found for this or any previous visit.  No results found for this or any previous visit.  No results found for this or any previous visit.  No results found for this or any previous visit.  Results for orders placed during the hospital encounter of 12/19/19  US RENAL  Narrative CLINICAL DATA:  Progressive chronic renal failure, obstructive Rob a thick, hypertension  EXAM: RENAL / URINARY TRACT ULTRASOUND COMPLETE  COMPARISON:  None  FINDINGS: Right Kidney:  Renal measurements: 8.4 x 3.3 x 4.4 cm = volume: 69 mL. Cortical thinning. Probably slightly increased cortical echogenicity. No mass, hydronephrosis, or shadowing calcification.  Left Kidney:  Renal measurements: 11.3 x 6.4 x 5.5 cm = volume: 2 8 mL. Cortical thinning. Increased cortical echogenicity. Cyst at mid upper pole 3.2 x 3.1 x 4.1 cm, simple features. No additional mass, hydronephrosis, or shadowing  calcification.  Bladder:  Appears normal for partial degree of bladder distention.  Other:  Enlarged prostate gland 4.9 x 4.7 x 7.0 cm (volume = 84 cm^3) extending into base of urinary bladder  IMPRESSION: Cortical thinning and probable medical renal disease changes of both kidneys.  4.1 cm LEFT renal cyst.  No evidence of additional renal mass or hydronephrosis.  Prostatomegaly.   Electronically Signed By: Ulyses Southward M.D. On: 12/20/2019 10:53  No valid procedures specified. No results found for this or any previous visit.  No results found for this or any previous visit.   Assessment & Plan:    1. Prostate cancer (HCC) MRI prostate  -folowup 6-8 weeks - Urinalysis, Routine w reflex microscopic - MR PROSTATE W WO CONTRAST; Future   No follow-ups on file.  Wilkie Aye, MD  [facility] [place]

## 2022-10-05 ENCOUNTER — Encounter: Payer: Self-pay | Admitting: Urology

## 2022-10-05 NOTE — Patient Instructions (Signed)

## 2022-10-07 ENCOUNTER — Other Ambulatory Visit: Payer: Self-pay | Admitting: Cardiology

## 2022-10-07 DIAGNOSIS — I5041 Acute combined systolic (congestive) and diastolic (congestive) heart failure: Secondary | ICD-10-CM

## 2022-10-07 DIAGNOSIS — I429 Cardiomyopathy, unspecified: Secondary | ICD-10-CM

## 2022-10-13 ENCOUNTER — Other Ambulatory Visit: Payer: Self-pay | Admitting: Cardiology

## 2022-10-13 ENCOUNTER — Ambulatory Visit (HOSPITAL_COMMUNITY)
Admission: RE | Admit: 2022-10-13 | Discharge: 2022-10-13 | Disposition: A | Discharge status: Home / Self Care | Payer: Medicare HMO | Source: Ambulatory Visit | Attending: Urology | Admitting: Urology

## 2022-10-13 DIAGNOSIS — I5041 Acute combined systolic (congestive) and diastolic (congestive) heart failure: Secondary | ICD-10-CM

## 2022-10-13 DIAGNOSIS — C61 Malignant neoplasm of prostate: Secondary | ICD-10-CM | POA: Diagnosis present

## 2022-10-13 MED ORDER — GADOBUTROL 1 MMOL/ML IV SOLN
10.0000 mL | Freq: Once | INTRAVENOUS | Status: AC | PRN
  Administered 2022-10-13: 10 mL via INTRAVENOUS

## 2022-10-22 ENCOUNTER — Telehealth: Payer: Self-pay | Admitting: Cardiology

## 2022-10-22 MED ORDER — DAPAGLIFLOZIN PROPANEDIOL 10 MG PO TABS: 10.0000 mg | ORAL_TABLET | Freq: Every day | ORAL | 3 refills | Status: DC

## 2022-10-22 NOTE — Telephone Encounter (Signed)
*  STAT* If patient is at the pharmacy, call can be transferred to refill team.   1. Which medications need to be refilled? (please list name of each medication and dose if known) new prescription for Farxiga   2. Would you like to learn more about the convenience, safety, & potential cost savings by using the [facility]?   3. Are you open to using the [facility] (Type [facility].    4. Which pharmacy/location (including street and city if local pharmacy) is medication to be sent to?[facility] [address], [place]   5. Do they need a 30 day or 90 day supply? 30 days and refills

## 2022-10-22 NOTE — Telephone Encounter (Signed)
Pt's medication was sent to pt's pharmacy as requested. Confirmation received.  °

## 2022-11-02 ENCOUNTER — Other Ambulatory Visit: Payer: Self-pay | Admitting: Internal Medicine

## 2022-11-02 ENCOUNTER — Telehealth: Payer: Self-pay | Admitting: Cardiology

## 2022-11-02 DIAGNOSIS — R17 Unspecified jaundice: Secondary | ICD-10-CM

## 2022-11-02 NOTE — Telephone Encounter (Signed)
Caller (Lauren) stated they will need to get  current ejection fracture percentage to get patient scheduled for an ASV titration sleep study.

## 2022-11-03 ENCOUNTER — Inpatient Hospital Stay
Admission: RE | Admit: 2022-11-03 | Discharge: 2022-11-03 | Discharge status: Home / Self Care | Payer: Medicare HMO | Source: Ambulatory Visit | Attending: Internal Medicine | Admitting: Internal Medicine

## 2022-11-03 DIAGNOSIS — R17 Unspecified jaundice: Secondary | ICD-10-CM

## 2022-11-03 NOTE — Telephone Encounter (Signed)
Will send request for records (pts last echo result showing his EF) to our HIM dept, for further management and releasing of records to [employer] at [place].

## 2022-11-04 NOTE — Telephone Encounter (Signed)
Brandon, Wiechman - 11/02/2022  9:24 AM Deliah Boston  Sent: Thu November 04, 2022 11:47 AM  To: Loa Socks, LPN; [facility] Him Pool         Message  Last echo report has been faxed to Lauren at [facility] sleep medicine. Fax #: [phone]

## 2022-11-08 NOTE — Progress Notes (Unsigned)
Virtual Visit Via Video       Consent was obtained for video visit:  Yes.   Answered questions that patient had about telehealth interaction:  Yes.   I discussed the limitations, risks, security and privacy concerns of performing an evaluation and management service by telemedicine. I also discussed with the patient that there may be a patient responsible charge related to this service. The patient expressed understanding and agreed to proceed.  Pt location: Home Physician Location: office Name of referring provider:  [name], MD I connected with Joel [place] at patients initiation/request on 11/11/2022 at  3:30 PM EDT by video enabled telemedicine application and verified that I am speaking with the correct person using two identifiers. Pt MRN:  [id number] Pt DOB:  Nov 22, 1945 Video Participants:  Joel [place];    Assessment/Plan:   1. 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  -Patient will continue his Keppra, 750 mg twice per day.  Refills provided.  2.  Hypertension with renal insufficiency  -Discussed importance of controlling blood pressure.  -Continue to watch patient's creatinine clearance, which is still declining.  May need to renally dose Keppra in the future, although he is not on maximum dose of Keppra.  3.    Congestive heart failure with cardiomyopathy and possible A-fib  -Following with cardiology  4.  Prostate cancer  -Following with urology   Subjective:   Joel [place] was seen today in follow up for seizure/frontal lobe epilepsy.  My previous records as well as any outside records available were reviewed prior to todays visit.  Patient continues to do well with Keppra from a seizure standpoint.  Takes Keppra, 750 mg twice per day.  No mood changes.  Has been following with urology regarding prostate cancer.  Notes are reviewed.  Unfortunately, he just had his shingles vaccine 2 days ago and really does not feel well because of that, so  decided for video visit today.   PREVIOUS MEDICATIONS: Dilantin (headache)  CURRENT MEDICATIONS:  Outpatient Encounter Medications as of 11/11/2022  Medication Sig   allopurinol (ZYLOPRIM) 100 MG tablet Take 1 tablet (100 mg total) by mouth daily.   apixaban (ELIQUIS) 5 MG TABS tablet Take 1 tablet (5 mg total) by mouth 2 (two) times daily.   atorvastatin (LIPITOR) 40 MG tablet Take 1 tablet (40 mg total) by mouth at bedtime.   azelastine (ASTELIN) 0.1 % nasal spray USE ONE PUFF IN EACH NOSTRIL TWICE A DAY AS NEEDED   azelastine (ASTELIN) 0.1 % nasal spray Place 2 sprays into both nostrils 2 (two) times daily.   carvedilol (COREG) 25 MG tablet Take 1 tablet (25 mg total) by mouth 2 (two) times daily. (Patient taking differently: Take 25 mg by mouth 2 (two) times daily. 12.5 mg po qAM and 25 mg po qPM)   dapagliflozin propanediol (FARXIGA) 10 MG TABS tablet Take 1 tablet (10 mg total) by mouth daily.   doxazosin (CARDURA) 8 MG tablet Take 1 tablet (8 mg total) by mouth in the morning AND 0.5 tablets (4 mg total) at bedtime.   dutasteride (AVODART) 0.5 MG capsule Take 1 capsule (0.5 mg total) by mouth daily.   fluticasone (FLONASE) 50 MCG/ACT nasal spray Place 1 spray into both nostrils 2 (two) times daily as needed.   levETIRAcetam (KEPPRA) 750 MG tablet TAKE 1 TABLET BY MOUTH TWICE A DAY   metolazone (ZAROXOLYN) 2.5 MG tablet TAKE 1 TABLET EVERY MONDAY, WEDNESDAY AND FRIDAY FOR 60 DOSES   torsemide (DEMADEX) 10  MG tablet TAKE 1 TABLET (10 MG TOTAL) BY MOUTH EVERY MORNING.   [DISCONTINUED] benazepril (LOTENSIN) 40 MG tablet TAKE 1 TABLET BY MOUTH DAILY   [DISCONTINUED] NIFEdipine (PROCARDIA) 10 MG capsule Take 1 capsule (10 mg total) by mouth 2 (two) times daily.   No facility-administered encounter medications on file as of 11/11/2022.     Objective:   PHYSICAL EXAMINATION:    VITALS:   There were no vitals filed for this visit.     GEN:  The patient appears stated age and is in  NAD. HEENT:  Normocephalic, atraumatic.    Neurological examination:  Orientation: The patient is alert and oriented x3. Cranial nerves: There is good facial symmetry. extraocular muscles are intact, without nystagmus.  The speech is fluent and clear.  No tongue deviation.  Follow up Instructions      -I discussed the assessment and treatment plan with the patient. The patient was provided an opportunity to ask questions and all were answered. The patient agreed with the plan and demonstrated an understanding of the instructions.   The patient was advised to call back or seek an in-person evaluation if the symptoms worsen or if the condition fails to improve as anticipated.     Kerin Salen, DO       Cc:  [name], MD

## 2022-11-11 ENCOUNTER — Telehealth: Payer: Medicare HMO | Admitting: Neurology

## 2022-11-11 DIAGNOSIS — I129 Hypertensive chronic kidney disease with stage 1 through stage 4 chronic kidney disease, or unspecified chronic kidney disease: Secondary | ICD-10-CM

## 2022-11-11 DIAGNOSIS — N289 Disorder of kidney and ureter, unspecified: Secondary | ICD-10-CM

## 2022-11-11 DIAGNOSIS — G40109 Localization-related (focal) (partial) symptomatic epilepsy and epileptic syndromes with simple partial seizures, not intractable, without status epilepticus: Secondary | ICD-10-CM

## 2022-11-11 DIAGNOSIS — G40909 Epilepsy, unspecified, not intractable, without status epilepticus: Secondary | ICD-10-CM | POA: Diagnosis not present

## 2022-11-11 MED ORDER — LEVETIRACETAM 750 MG PO TABS
750.0000 mg | ORAL_TABLET | Freq: Two times a day (BID) | ORAL | 3 refills | Status: DC
Start: 2022-11-11 — End: 2023-11-08

## 2022-11-15 ENCOUNTER — Encounter: Payer: Self-pay | Admitting: Physician Assistant

## 2022-11-15 ENCOUNTER — Ambulatory Visit: Payer: Medicare HMO | Attending: Physician Assistant | Admitting: Physician Assistant

## 2022-11-15 VITALS — BP 136/72 | HR 70 | Ht 74.0 in | Wt 211.0 lb

## 2022-11-15 DIAGNOSIS — I4819 Other persistent atrial fibrillation: Secondary | ICD-10-CM

## 2022-11-15 DIAGNOSIS — N1832 Chronic kidney disease, stage 3b: Secondary | ICD-10-CM

## 2022-11-15 DIAGNOSIS — I5042 Chronic combined systolic (congestive) and diastolic (congestive) heart failure: Secondary | ICD-10-CM

## 2022-11-15 DIAGNOSIS — R931 Abnormal findings on diagnostic imaging of heart and coronary circulation: Secondary | ICD-10-CM

## 2022-11-15 DIAGNOSIS — I10 Essential (primary) hypertension: Secondary | ICD-10-CM | POA: Diagnosis not present

## 2022-11-15 DIAGNOSIS — I4891 Unspecified atrial fibrillation: Secondary | ICD-10-CM

## 2022-11-15 DIAGNOSIS — Z79899 Other long term (current) drug therapy: Secondary | ICD-10-CM

## 2022-11-15 DIAGNOSIS — I2510 Atherosclerotic heart disease of native coronary artery without angina pectoris: Secondary | ICD-10-CM

## 2022-11-15 MED ORDER — DAPAGLIFLOZIN PROPANEDIOL 10 MG PO TABS: 10.0000 mg | ORAL_TABLET | Freq: Every day | ORAL | 3 refills | Status: DC

## 2022-11-15 NOTE — Progress Notes (Unsigned)
Cardiology Office Note:  .   Date:  11/16/2022  ID:  [name], DOB 05/13/1945, MRN [id number] PCP: [name], MD  [facility] Providers Cardiologist:  Tessa Lerner, DO     History of Present Illness: .   Kari Lather is a 77 y.o. male with PMH of atrial fibrillation, chronic combined systolic and diastolic heart failure, severe coronary artery calcification (CAC 1130 which placed the patient in 93rd percentile for age and sex matched control), elevated LP(a), hypertension, OSA on CPAP, prediabetes, pulmonary nodule and CKD stage III-IV.  Echocardiogram obtained on 04/06/2021 showed EF 25 to 30%, speckled pattern suggestive of infiltrative cardiomyopathy, severe LVH, severely dilated left atrium, moderate MR, moderate TR, RVSP 72 mmHg.  Calcium scoring test obtained on 04/13/2021 showed calcium score 1130, which placed the patient in 93rd percentile for age and sex matched control, small indeterminant pulmonary nodule largest measuring 5 mm.  Due to significant coronary artery calcification, he underwent stress test in April 2023.  Stress test reported to be high risk due to reduced LVEF, dilated LV cavity but no evidence of reversible ischemia.  Given his progression of CKD and no angina, shared decision was to monitor him clinically and hold off on invasive study which may worsen renal function due to contrast nephropathy.  Heart monitor in September 2023 showed predominantly sinus rhythm with first-degree AV block, 19.8% of supraventricular ectopic burden, average heart rate 55 bpm, no pauses longer than 3 seconds, no high-grade AV block.  He was diagnosed with new atrial fibrillation during office visit in July 2024 and was started on renal dosing of anticoagulation therapy.  He was last seen by Dr. Odis Hollingshead on 09/09/2022, at which time he was still in atrial fibrillation.  He mentioned the doctor told her that Eliquis may became cost prohibitive, he did not qualify for patient assistance.   Possibility of cardioversion has been discussed with the patient, he wished to think about it.  He is not on Entresto due to worsening renal function, not on spironolactone in the past due to feeling of tired/fatigued.  Isosorbide dinitrate and hydralazine combo stopped due to intolerance.  Patient presents today for follow-up.  He has chronic dyspnea on exertion.  He has 2+ pitting edema.  He says his PCP asked him to be seen by cardiology service earlier.  We have requested most recent office visit from his PCPs office.  It appears recent blood work showed his NT proBNP was 12,831, he is BNP was 1330.  Hemoglobin 12.0.  Liver enzymes normal, creatinine 2.55, GFR 25.  He is unable to tolerate Entresto and spironolactone given poor renal function.  He is on carvedilol and [place].  Marcelline Deist will be contraindicated if GFR is less than 25, he is borderline when comes to GFR for [place].  I did refill the [place] for now.  He has previously tried isosorbide dinitrate/hydralazine combination and could not tolerate it based on Dr. Emelda Brothers note.  Despite 2+ pitting edema, his lung is quite clear.  He is using CPAP therapy every night.  He's poor renal function made it very difficult to consider further diuresis.  Even though benazepril and nifedipine are listed, he is not taking those medications.  He is taking torsemide 10 mg every morning.  He also takes metolazone on a as needed basis instead of 3 times weekly as prescribed.  In the past 2 weeks, he has taken metolazone twice.  He monitor his weight very closely at home and is  aware to take a dose of metolazone if his weight goes up significantly.  He says his lower extremity swelling actually significantly improved after a dose of metolazone yesterday morning.  I highly encouraged him to follow-up with nephrology service.  Based on the paperwork, his PCP did place a referral to nephrology service during recent office visit.  With his prior history, I think he would  also benefit from being followed by heart failure service.  He is previous echocardiogram from last year showed a speckled appearance with concern for infiltrative heart disease.  I do not see any additional workup for the infiltrative heart disease.  I plan for repeat echocardiogram, if it shows speckled appearance, we will need to consider PYP study.  I did talk to the patient again regarding whether or not to proceed with cardioversion.  At this time, he is still quite hesitant to consider cardioversion.  Fortunately, he seems to be compliant with his medication and the heart rate is very well-controlled.  ROS:   Patient complains of dyspnea on exertion which is chronic.  He has 2+ pitting edema.  He has no orthopnea or PND.  He has been compliant with CPAP therapy.  Studies Reviewed: Marland Kitchen   EKG Interpretation Date/Time:  Monday November 15 2022 11:13:58 EDT Ventricular Rate:  70 PR Interval:    QRS Duration:  126 QT Interval:  468 QTC Calculation: 505 R Axis:   -58  Text Interpretation: Atrial fibrillation Left axis deviation Right bundle branch block Confirmed by Azalee Course [phone]) on 11/16/2022 4:17:29 PM     Risk Assessment/Calculations:    CHA2DS2-VASc Score = 4   This indicates a 4.8% annual risk of stroke. The patient's score is based upon: CHF History: 1 HTN History: 1 Diabetes History: 0 Stroke History: 0 Vascular Disease History: 0 Age Score: 2 Gender Score: 0            Physical Exam:   VS:  BP 136/72 (BP Location: Left Arm, Patient Position: Sitting, Cuff Size: Normal)   Pulse 70   Ht 6\' 2"  (1.88 m)   Wt 211 lb (95.7 kg)   SpO2 99%   BMI 27.09 kg/m    Wt Readings from Last 3 Encounters:  11/15/22 211 lb (95.7 kg)  09/09/22 212 lb (96.2 kg)  08/09/22 212 lb 9.6 oz (96.4 kg)    GEN: Well nourished, well developed in no acute distress NECK: No JVD; No carotid bruits CARDIAC: Irregularly irregular, no murmurs, rubs, gallops RESPIRATORY:  Clear to auscultation  without rales, wheezing or rhonchi  ABDOMEN: Soft, non-tender, non-distended EXTREMITIES:  2+ edema; No deformity   ASSESSMENT AND PLAN: .     Persistent Atrial Fibrillation Predominantly rate controlled atrial fibrillation. Adequate rate control on Carvedilol. Anticoagulated with Eliquis for stroke prevention. -Continue current management. -We discussed the possibility of proceeding with cardioversion, however patient is not very enthusiastic about cardioversion.  Will continue with rate control therapy for now.  He will let us know if he changes mind. -Since his heart rate is fairly controlled, I am not confident that restore sinus rhythm will truly improve his ejection fraction.  Chronic combined systolic and diastolic heart failure Ejection fraction 25-30%, indicating reduced cardiac function. Patient experiences fatigue with exertion. Noted 2+ pitting edema, however his lung is clear on exam.  ProBNP significantly elevated at 12,000 at PCP's office.   -Took a dose of metolazone yesterday, his lower extremity edema significantly improved today.  Lung is clear on exam.  Will hold off on additional diuretic. -Continue Carvedilol and Farxiga. -Order repeat echocardiogram to assess current cardiac function. -Refer to heart failure service for further management.  Chronic Kidney Disease GFR 27, indicating stage 3 or 4 CKD. Creatinine 2.4. -Continue Farxiga, monitor kidney function closely. -He says he does not have a nephrologist.  Based on paperwork from Dr. Diamantina [facility] office, Dr. August Saucer just referred him to nephrologist recently.  Abnormal echocardiogram: Echocardiogram obtained on 04/06/2021 showed EF 25 to 30%, speckled pattern suggestive of infiltrative cardiomyopathy, severe LVH, severely dilated left atrium, moderate MR, moderate TR, RVSP 72 mmHg.  Plan to repeat echocardiogram.  He will benefit from following by heart failure service.  I am unable to see if he ever underwent a PYP test to  rule out infiltrative heart disease.  If repeat echocardiogram continued to show speckled pattern suggestive of infiltrative cardiomyopathy, he would benefit from PYP scan.  Not sure if renal function with allow him to undergo cardiac MRI.       Dispo: Establish with nephrology service and also heart failure service.  Signed, Azalee Course, PA

## 2022-11-15 NOTE — Patient Instructions (Addendum)
Medication Instructions:  NO CHANGES *If you need a refill on your cardiac medications before your next appointment, please call your pharmacy*   Lab Work: NO LABS If you have labs (blood work) drawn today and your tests are completely normal, you will receive your results only by: MyChart Message (if you have MyChart) OR A paper copy in the mail If you have any lab test that is abnormal or we need to change your treatment, we will call you to review the results.   Testing/Procedures:[address] Your physician has requested that you have an echocardiogram. Echocardiography is a painless test that uses sound waves to create images of your heart. It provides your doctor with information about the size and shape of your heart and how well your heart's chambers and valves are working. This procedure takes approximately one hour. There are no restrictions for this procedure. Please do NOT wear cologne, perfume, aftershave, or lotions (deodorant is allowed). Please arrive 15 minutes prior to your appointment time.    Follow-Up: At [facility], you and your health needs are our priority.  As part of our continuing mission to provide you with exceptional heart care, we have created designated Provider Care Teams.  These Care Teams include your primary Cardiologist (physician) and Advanced Practice Providers (APPs -  Physician Assistants and Nurse Practitioners) who all work together to provide you with the care you need, when you need it.   Your next appointment:   KEEP FOLLOW UP March 28 2023  Provider:   Tessa Lerner, MD

## 2022-11-23 ENCOUNTER — Telehealth: Payer: Self-pay | Admitting: Cardiology

## 2022-11-23 MED ORDER — DAPAGLIFLOZIN PROPANEDIOL 10 MG PO TABS: 10.0000 mg | ORAL_TABLET | Freq: Every day | ORAL | 3 refills | Status: DC

## 2022-11-23 NOTE — Telephone Encounter (Signed)
Printed prescription has been signed and faxed. Patient is aware.

## 2022-11-23 NOTE — Telephone Encounter (Signed)
Pt c/o medication issue:  1. Name of Medication:   dapagliflozin propanediol (FARXIGA) 10 MG TABS tablet   2. How are you currently taking this medication (dosage and times per day)?  As prescribed  3. Are you having a reaction (difficulty breathing--STAT)?  No  4. What is your medication issue?   Patient stated he has been working on getting assistance with this medication from [place] [employer] [place] and they will need a prescription for this medication faxed to fax# [phone].  Patient stated he is running low on this medication.

## 2022-12-01 ENCOUNTER — Ambulatory Visit: Payer: Medicare HMO | Admitting: Urology

## 2022-12-01 VITALS — BP 119/75 | HR 79

## 2022-12-01 DIAGNOSIS — C61 Malignant neoplasm of prostate: Secondary | ICD-10-CM

## 2022-12-01 NOTE — Progress Notes (Signed)
12/01/2022 2:31 PM   Joel Duarte [birth year] [id number]  Referring provider: [name], MD [address] [place],  [place] [address]  Followup prostate cancer   HPI: Joel Duarte is a 77yo here for followup for prostate cancer. Prostate MRI shows a PIRADS 3 lesion and and PIRADs 4 lesion. He continue to have a rising PSA on active surveillance for prostate cancer. No worsening LUTS   PMH: Past Medical History:  Diagnosis Date   Allergy    Atrial fibrillation (HCC)    Enlarged prostate    Gout    Hyperlipidemia    Hypertension    Osteoarthrosis, unspecified whether generalized or localized, unspecified site    Seizures (HCC)    history of 1 a year ago    Surgical History: Past Surgical History:  Procedure Laterality Date   no surgical h/o  June 2014    Home Medications:  Allergies as of 12/01/2022   No Known Allergies      Medication List        Accurate as of December 01, 2022  2:31 PM. If you have any questions, ask your nurse or doctor.          allopurinol 100 MG tablet Commonly known as: ZYLOPRIM Take 1 tablet (100 mg total) by mouth daily.   apixaban 5 MG Tabs tablet Commonly known as: Eliquis Take 1 tablet (5 mg total) by mouth 2 (two) times daily.   atorvastatin 40 MG tablet Commonly known as: LIPITOR Take 1 tablet (40 mg total) by mouth at bedtime.   azelastine 0.1 % nasal spray Commonly known as: ASTELIN USE ONE PUFF IN EACH NOSTRIL TWICE A DAY AS NEEDED   azelastine 0.1 % nasal spray Commonly known as: ASTELIN Place 2 sprays into both nostrils 2 (two) times daily.   carvedilol 25 MG tablet Commonly known as: COREG Take 1 tablet (25 mg total) by mouth 2 (two) times daily. What changed: additional instructions   dapagliflozin propanediol 10 MG Tabs tablet Commonly known as: Farxiga Take 1 tablet (10 mg total) by mouth daily.   doxazosin 8 MG tablet Commonly known as: CARDURA Take 1 tablet (8 mg total) by mouth  in the morning AND 0.5 tablets (4 mg total) at bedtime.   dutasteride 0.5 MG capsule Commonly known as: AVODART Take 1 capsule (0.5 mg total) by mouth daily.   fluticasone 50 MCG/ACT nasal spray Commonly known as: FLONASE Place 1 spray into both nostrils 2 (two) times daily as needed.   levETIRAcetam 750 MG tablet Commonly known as: KEPPRA Take 1 tablet (750 mg total) by mouth 2 (two) times daily.   metolazone 2.5 MG tablet Commonly known as: ZAROXOLYN TAKE 1 TABLET EVERY MONDAY, WEDNESDAY AND FRIDAY FOR 60 DOSES   torsemide 10 MG tablet Commonly known as: DEMADEX TAKE 1 TABLET (10 MG TOTAL) BY MOUTH EVERY MORNING.        Allergies: No Known Allergies  Family History: Family History  Problem Relation Age of Onset   Hypertension Mother    Heart Problems Mother    Healthy Sister    Healthy Child     Social History:  reports that he has never smoked. He has never used smokeless tobacco. He reports that he does not currently use alcohol. He reports that he does not use drugs.  ROS: All other review of systems were reviewed and are negative except what is noted above in HPI  Physical Exam: BP 119/75   Pulse 79  Constitutional:  Alert and oriented, No acute distress. HEENT: [place] AT, moist mucus membranes.  Trachea midline, no masses. Cardiovascular: No clubbing, cyanosis, or edema. Respiratory: Normal respiratory effort, no increased work of breathing. GI: Abdomen is soft, nontender, nondistended, no abdominal masses GU: No CVA tenderness.  Lymph: No cervical or inguinal lymphadenopathy. Skin: No rashes, bruises or suspicious lesions. Neurologic: Grossly intact, no focal deficits, moving all 4 extremities. Psychiatric: Normal mood and affect.  Laboratory Data: Lab Results  Component Value Date   WBC 10.6 (H) 08/06/2011   HGB 11.9 (L) 08/10/2022   HCT 38.6 08/10/2022   MCV 79.0 08/06/2011   PLT 158 08/06/2011    Lab Results  Component Value Date    CREATININE 2.40 (H) 08/10/2022    No results found for: "PSA"  No results found for: "TESTOSTERONE"  Lab Results  Component Value Date   HGBA1C 6.0 11/30/2011    Urinalysis    Component Value Date/Time   COLORURINE YELLOW 06/07/2011 0856   APPEARANCEUR Clear 09/29/2022 1340   LABSPEC 1.013 06/07/2011 0856   PHURINE 5.0 06/07/2011 0856   GLUCOSEU 2+ (A) 09/29/2022 1340   HGBUR TRACE (A) 06/07/2011 0856   BILIRUBINUR Negative 09/29/2022 1340   KETONESUR NEGATIVE 06/07/2011 0856   PROTEINUR Trace 09/29/2022 1340   PROTEINUR 30 (A) 06/07/2011 0856   UROBILINOGEN 0.2 06/07/2011 0856   NITRITE Negative 09/29/2022 1340   NITRITE NEGATIVE 06/07/2011 0856   LEUKOCYTESUR Negative 09/29/2022 1340    Lab Results  Component Value Date   LABMICR Comment 09/29/2022   WBCUA 0-5 03/29/2022   LABEPIT 0-10 03/29/2022   MUCUS Present 09/28/2021   BACTERIA None seen 03/29/2022    Pertinent Imaging: MRI prostate: Images reviewed and discussed with the patient  No results found for this or any previous visit.  No results found for this or any previous visit.  No results found for this or any previous visit.  No results found for this or any previous visit.  Results for orders placed during the hospital encounter of 12/19/19  US RENAL  Narrative CLINICAL DATA:  Progressive chronic renal failure, obstructive Rob a thick, hypertension  EXAM: RENAL / URINARY TRACT ULTRASOUND COMPLETE  COMPARISON:  None  FINDINGS: Right Kidney:  Renal measurements: 8.4 x 3.3 x 4.4 cm = volume: 69 mL. Cortical thinning. Probably slightly increased cortical echogenicity. No mass, hydronephrosis, or shadowing calcification.  Left Kidney:  Renal measurements: 11.3 x 6.4 x 5.5 cm = volume: 2 8 mL. Cortical thinning. Increased cortical echogenicity. Cyst at mid upper pole 3.2 x 3.1 x 4.1 cm, simple features. No additional mass, hydronephrosis, or shadowing  calcification.  Bladder:  Appears normal for partial degree of bladder distention.  Other:  Enlarged prostate gland 4.9 x 4.7 x 7.0 cm (volume = 84 cm^3) extending into base of urinary bladder  IMPRESSION: Cortical thinning and probable medical renal disease changes of both kidneys.  4.1 cm LEFT renal cyst.  No evidence of additional renal mass or hydronephrosis.  Prostatomegaly.   Electronically Signed By: Ulyses Southward M.D. On: 12/20/2019 10:53  No valid procedures specified. No results found for this or any previous visit.  No results found for this or any previous visit.   Assessment & Plan:    1. Prostate cancer (HCC) isoPSA, if the Iso PSA is normal I will see him back in 6 months with a PSA. If IsoPSA is abnormal I will refer him for a fusion biopsy   No follow-ups on file.  Wilkie Aye, MD  [facility] [place]

## 2022-12-06 ENCOUNTER — Ambulatory Visit (HOSPITAL_COMMUNITY): Payer: Medicare HMO | Attending: Physician Assistant

## 2022-12-06 DIAGNOSIS — I5042 Chronic combined systolic (congestive) and diastolic (congestive) heart failure: Secondary | ICD-10-CM | POA: Insufficient documentation

## 2022-12-06 LAB — ECHOCARDIOGRAM COMPLETE
Area-P 1/2: 3.9 cm2
S' Lateral: 4.15 cm

## 2022-12-07 ENCOUNTER — Encounter: Payer: Self-pay | Admitting: Urology

## 2022-12-07 NOTE — Patient Instructions (Signed)
Transrectal Ultrasound-Guided Prostate Biopsy An ultrasound is a test that uses sound waves to take pictures of the inside of the body. During this test, a small device (probe) with a gel is put inside your butt (rectum). The probe will take pictures of your prostate to help guide the needle. A needle will be inserted to take samples of tissues for testing. This test may be done to check for changes in the prostate, like prostate swelling or cancer. Tell your doctor about: Any allergies you have. All medicines you are taking. Any problems you or family members have had with anesthetic medicines. Any bleeding problems you have. Any surgeries you have had. Any medical conditions you have. Any prostate infections you have had. What are the risks? Infection. Bleeding from the butt. Blood in the pee (urine). Allergic reactions to medicines. Damage to nearby parts. Trouble peeing. Nerve damage. This is usually temporary. What happens before the test? Medicines Ask your doctor about changing or stopping: Your normal medicines. Vitamins, herbs, and supplements. Over-the-counter medicines. Do not take aspirin or ibuprofen unless you are told to. General instructions Follow instructions from your doctor about what you cannot eat or drink. Liquid will be used to clear waste from your butt (enema). You may have a blood sample taken. You may have a pee sample taken. For your safety, your doctor may: Ask you to wash with a soap that kills germs. Give you antibiotic medicine. If you will be going home right after the test, plan to have a responsible adult: Take you home from the hospital or clinic. You will not be allowed to drive. Care for you for the time you are told. What happens during the test?  An IV tube will be put into one of your veins. You will be given one or both of these: A medicine to help you relax. A medicine to numb the area. You will be placed on your left side. Your  knees will be bent toward your chest. A probe with gel on it will be placed in your butt. Pictures will be taken of your prostate and the area around it. Medicine will be used to numb your prostate. A needle will be placed in your butt and moved to your prostate. Prostate tissue will be taken out. The needle and probe will be taken out. The samples will be sent to a lab. The procedure may vary among doctors and hospitals. What happens after the test? You will be watched until you leave the hospital or clinic. This includes checking your blood pressure, heart rate, breathing rate, and blood oxygen level. You may have some pain in your butt. You will be given medicine for it. If you were given a sedative during the test, do not drive or use machines until your doctor says that it is safe. A sedative is a medicine that helps you relax. It is up to you to get the results of your test. Ask how to get your results when they are ready. Summary This test is usually done to check for prostate cancer. Before the test, ask your doctor about changing or stopping your medicines. You may have some pain in your butt. You will be given medicine for it. Plan to have a responsible adult take you home from the hospital or clinic. This information is not intended to replace advice given to you by your health care provider. Make sure you discuss any questions you have with your health care provider. Document Revised: 06/30/2020  Document Reviewed: 06/30/2020 Elsevier Patient Education  2024 [employer].

## 2022-12-28 ENCOUNTER — Other Ambulatory Visit: Payer: Self-pay

## 2022-12-28 DIAGNOSIS — I517 Cardiomegaly: Secondary | ICD-10-CM

## 2022-12-28 DIAGNOSIS — R9431 Abnormal electrocardiogram [ECG] [EKG]: Secondary | ICD-10-CM

## 2022-12-29 ENCOUNTER — Ambulatory Visit
Admission: RE | Admit: 2022-12-29 | Discharge: 2022-12-29 | Disposition: A | Discharge status: Home / Self Care | Payer: Medicare HMO | Source: Ambulatory Visit | Attending: Internal Medicine | Admitting: Internal Medicine

## 2022-12-29 ENCOUNTER — Other Ambulatory Visit: Payer: Self-pay | Admitting: Internal Medicine

## 2022-12-29 DIAGNOSIS — M542 Cervicalgia: Secondary | ICD-10-CM

## 2023-01-02 ENCOUNTER — Other Ambulatory Visit: Payer: Self-pay | Admitting: Cardiology

## 2023-01-02 DIAGNOSIS — I4891 Unspecified atrial fibrillation: Secondary | ICD-10-CM

## 2023-01-03 NOTE — Telephone Encounter (Signed)
Prescription refill request for Eliquis received. Indication:afib Last office visit:10/24 Scr:2.40  7/24 Age: 77 Weight:95.7  kg  Prescription refilled

## 2023-01-08 ENCOUNTER — Other Ambulatory Visit: Payer: Self-pay | Admitting: Cardiology

## 2023-01-08 DIAGNOSIS — I5041 Acute combined systolic (congestive) and diastolic (congestive) heart failure: Secondary | ICD-10-CM

## 2023-01-08 DIAGNOSIS — I429 Cardiomyopathy, unspecified: Secondary | ICD-10-CM

## 2023-01-14 ENCOUNTER — Telehealth (HOSPITAL_COMMUNITY): Payer: Self-pay | Admitting: *Deleted

## 2023-01-14 NOTE — Telephone Encounter (Signed)
Spoke to pt's wife and answered questions regarding amyloid study.

## 2023-01-19 HISTORY — PX: CATARACT EXTRACTION: SUR2

## 2023-01-20 ENCOUNTER — Ambulatory Visit (HOSPITAL_COMMUNITY): Payer: PPO | Attending: Physician Assistant

## 2023-01-20 DIAGNOSIS — R9431 Abnormal electrocardiogram [ECG] [EKG]: Secondary | ICD-10-CM | POA: Diagnosis present

## 2023-01-20 DIAGNOSIS — I517 Cardiomegaly: Secondary | ICD-10-CM | POA: Diagnosis present

## 2023-01-20 LAB — MYOCARDIAL AMYLOID PLANAR & SPECT: H/CL Ratio: 1.55

## 2023-01-20 MED ORDER — TECHNETIUM TC 99M PYROPHOSPHATE
20.2000 | Freq: Once | INTRAVENOUS | Status: AC
  Administered 2023-01-20: 20.2 via INTRAVENOUS

## 2023-01-26 ENCOUNTER — Ambulatory Visit (HOSPITAL_COMMUNITY)
Admission: RE | Admit: 2023-01-26 | Discharge: 2023-01-26 | Disposition: A | Discharge status: Home / Self Care | Payer: PPO | Source: Ambulatory Visit | Attending: Cardiology | Admitting: Cardiology

## 2023-01-26 ENCOUNTER — Encounter (HOSPITAL_COMMUNITY): Payer: Self-pay | Admitting: Cardiology

## 2023-01-26 VITALS — BP 130/70 | HR 70 | Wt 213.2 lb

## 2023-01-26 DIAGNOSIS — I5022 Chronic systolic (congestive) heart failure: Secondary | ICD-10-CM | POA: Insufficient documentation

## 2023-01-26 DIAGNOSIS — E854 Organ-limited amyloidosis: Secondary | ICD-10-CM | POA: Diagnosis not present

## 2023-01-26 DIAGNOSIS — I4811 Longstanding persistent atrial fibrillation: Secondary | ICD-10-CM | POA: Diagnosis not present

## 2023-01-26 DIAGNOSIS — I5042 Chronic combined systolic (congestive) and diastolic (congestive) heart failure: Secondary | ICD-10-CM

## 2023-01-26 DIAGNOSIS — R9431 Abnormal electrocardiogram [ECG] [EKG]: Secondary | ICD-10-CM | POA: Insufficient documentation

## 2023-01-26 DIAGNOSIS — N183 Chronic kidney disease, stage 3 unspecified: Secondary | ICD-10-CM | POA: Insufficient documentation

## 2023-01-26 DIAGNOSIS — I130 Hypertensive heart and chronic kidney disease with heart failure and stage 1 through stage 4 chronic kidney disease, or unspecified chronic kidney disease: Secondary | ICD-10-CM | POA: Diagnosis not present

## 2023-01-26 DIAGNOSIS — N1832 Chronic kidney disease, stage 3b: Secondary | ICD-10-CM | POA: Insufficient documentation

## 2023-01-26 DIAGNOSIS — I43 Cardiomyopathy in diseases classified elsewhere: Secondary | ICD-10-CM | POA: Diagnosis not present

## 2023-01-26 MED ORDER — CARVEDILOL 6.25 MG PO TABS: 6.2500 mg | ORAL_TABLET | Freq: Two times a day (BID) | ORAL | 3 refills | Status: DC

## 2023-01-26 NOTE — Progress Notes (Signed)
 Blood collected for TTR genetic testing per Dr Elwyn Lade.  Order form completed and both shipped by Fed[facility] to [facility].

## 2023-01-26 NOTE — Patient Instructions (Addendum)
 Medication Changes:  DECREASE CARVEDILOL  TO 6.25MG  TWICE DAILY   Lab Work:  Labs done today, your results will be available in MyChart, we will contact you for abnormal readings.  Genetic test has been done, this has to be sent to [place]  to be processed and can take 1-2 weeks to get results back.  We will let you know the results.   24 hour URINE NEEDED- PLEASE REFER TO [facility] FOR URINE TESTING: LOCATIONS LISTED BELOW   Address: [address], [place], [place] [address] Hours:  Open ? Closes 5?PM Phone: [phone]  Address: [address] [place], [place] [address] Hours:  Open ? Closes 12?PM ? Reopens 1?PM Phone: [phone] Appointments: [email or web address]  Follow-Up in: 6 WEEKS AS SCHEDULED   At the [facility], you and your health needs are our priority. We have a designated team specialized in the treatment of Heart Failure. This Care Team includes your primary Heart Failure Specialized Cardiologist (physician), Advanced Practice Providers (APPs- Physician Assistants and Nurse Practitioners), and Pharmacist who all work together to provide you with the care you need, when you need it.   You may see any of the following providers on your designated Care Team at your next follow up:  Dr. Toribio Fuel Dr. Ezra Shuck Dr. Ria Commander Dr. Odis Brownie Greig Mosses, NP Caffie Shed, Joel Duarte [facility] Joel Duarte, Joel Duarte Joel Coe, NP Joel Lee, NP Joel Duarte, PharmD   Please be sure to bring in all your medications bottles to every appointment.   Need to Contact Us :  If you have any questions or concerns before your next appointment please send us  a message through [place] or call our office at [phone].    TO LEAVE A MESSAGE FOR THE NURSE SELECT OPTION 2, PLEASE LEAVE A MESSAGE INCLUDING: YOUR NAME DATE OF BIRTH CALL BACK NUMBER REASON FOR CALL**this is important as we prioritize the call backs  YOU WILL RECEIVE A  CALL BACK THE SAME DAY AS LONG AS YOU CALL BEFORE 4:00 PM

## 2023-01-26 NOTE — Progress Notes (Signed)
 ADVANCED HEART FAILURE NEW PATIENT CLINIC NOTE  Referring Physician: Addie Camellia CROME, MD  Primary Care: Addie Camellia CROME, MD Primary Cardiologist:  HPI: Joel Duarte is a 78 y.o. male with a PMH of atrial fibrillation, chronic combined systolic and diastolic heart failure, CAD, HTN, CKD IIIb who presents for initial visit for further evaluation and treatment of heart failure/cardiomyopathy.      Echocardiogram obtained on 04/06/2021 showed EF 25 to 30%, speckled pattern suggestive of infiltrative cardiomyopathy, severe LVH, severely dilated left atrium, moderate MR, moderate TR, RVSP 72 mmHg. CAC sent which showed an elevated calcium  score, stress testing did not show evidence of reversible ischemia. Given advanced CKD as well as lack of angina did not pursue further testing.   He underwent PYP scan that was markedly positive and was referred to HF clinic for further management.      SUBJECTIVE: Patient reports that overall he is doing well from a heart failure standpoint. He is not limited much by shortness of breath. His weights and swelling have remained stable on his current dose of diuretics, he takes about 1 dose of metolazone  every few weeks. He denies any significant neuropathy symptoms, no history of carpal tunnel, family history of heart disease in his mother but unsure which type.   PMH, current medications, allergies, social history, and family history reviewed in epic.  PHYSICAL EXAM: Vitals:   01/26/23 1438  BP: 130/70  Pulse: 70  SpO2: 98%   GENERAL: Well nourished and in no apparent distress at rest.  HEENT: The mucous membranes are pink and moist.   PULM:  Normal work of breathing, clear to auscultation bilaterally. Respirations are unlabored.  CARDIAC:  JVP: Mildly elevated       Bradycardic, irregular rhythm. No murmurs, rubs or gallops.  1+ nonpitting edema edema.  ABDOMEN: Soft, non-tender, non-distended. NEUROLOGIC: Patient is oriented x3 with no focal or  lateralizing neurologic deficits.  PSYCH: Patients affect is appropriate, there is no evidence of anxiety or depression.  SKIN: Warm and dry; no lesions or wounds. Warm and well perfused extremities.  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 - NYHA Class: II - Volume status: Hypervolemic - ACEi/ARB/ARNI: Not indicated - Aldosterone antagonist: Plan to start at a subsequent visit - Beta-blocker: Maximally tolerated dose - Digoxin: Intolerant - Hydralazine /Nitrates: Intolerant - SGLT2i: Maximally tolerated dose - GLP-1: Not a candidate - Advanced therapies: Not a candidate - ICD: Discuss at next visit   ASSESSMENT & PLAN:  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 Long discussion today about the progression of disease and the potential therapies. Will plan on starting PA process for tafamdis once AL labs result. He does appear hypervolemic on exam but only with NYHA class II symptoms currently. Given his existing CKD will need to use caution with diuretics. - Continue SGLT-2, would prioritize even given low GFR - Continue toresmide 10mg  daily, may need increase at next visit - AL labs sent - TTR genetic panel - Follow  up for tafamadis - Continue prn metolazone  - Careful control of salt intake - Reduce carvedilol  to 6.25mg  BID given slow afib  Atrial fibrillation: Long standing persistent, patient not currently interested in  cardioversion. Unlikley to be durable given LA enlargement, but if he worsens would consider. Would likely need amiodarone in that case. - Continue anticoagulation  CAC: Noted on CT scan, no anginal symptoms currently. Would only pursue cardiac cath if he were to develop symptoms. - Statin  CKD stage IIIB: Unfortuantely a poor prognostic sign, tolerating diuresis well currently. - Follows with nephrology   I spent 70 minutes caring for this patient today including face to face time, ordering and reviewing labs, reviewing records from general cardiology, personally reviewing echocardiogram/CT/PYP scans, seeing the patient, documenting in the record, discussing case with pharmacy and arranging follow ups.   Morene Brownie, MD Advanced Heart Failure Mechanical Circulatory Support 01/27/23

## 2023-01-27 LAB — KAPPA/LAMBDA LIGHT CHAINS
Kappa free light chain: 76.6 mg/L — ABNORMAL HIGH (ref 3.3–19.4)
Kappa, lambda light chain ratio: 2.21 — ABNORMAL HIGH (ref 0.26–1.65)
Lambda free light chains: 34.6 mg/L — ABNORMAL HIGH (ref 5.7–26.3)

## 2023-02-03 LAB — MULTIPLE MYELOMA PANEL, SERUM
Albumin SerPl Elph-Mcnc: 3.5 g/dL (ref 2.9–4.4)
Albumin/Glob SerPl: 1.1 (ref 0.7–1.7)
Alpha 1: 0.2 g/dL (ref 0.0–0.4)
Alpha2 Glob SerPl Elph-Mcnc: 0.7 g/dL (ref 0.4–1.0)
B-Globulin SerPl Elph-Mcnc: 0.9 g/dL (ref 0.7–1.3)
Gamma Glob SerPl Elph-Mcnc: 1.3 g/dL (ref 0.4–1.8)
Globulin, Total: 3.2 g/dL (ref 2.2–3.9)
IgA: 175 mg/dL (ref 61–437)
IgG (Immunoglobin G), Serum: 1422 mg/dL (ref 603–1613)
IgM (Immunoglobulin M), Srm: 30 mg/dL (ref 15–143)
Total Protein ELP: 6.7 g/dL (ref 6.0–8.5)

## 2023-02-08 ENCOUNTER — Telehealth (HOSPITAL_COMMUNITY): Payer: Self-pay | Admitting: Pharmacy Technician

## 2023-02-08 ENCOUNTER — Other Ambulatory Visit (HOSPITAL_COMMUNITY): Payer: Self-pay

## 2023-02-08 ENCOUNTER — Telehealth (HOSPITAL_COMMUNITY): Payer: Self-pay | Admitting: Pharmacist

## 2023-02-08 ENCOUNTER — Encounter (HOSPITAL_COMMUNITY): Payer: Self-pay | Admitting: Cardiology

## 2023-02-08 NOTE — Progress Notes (Signed)
PA for Vyndamax (tafamidis) is now approved. Will work on Counselling psychologist for copay and will reach out to patient to start medication.

## 2023-02-08 NOTE — Telephone Encounter (Signed)
Advanced Heart Failure Patient Advocate Encounter  Prior Authorization for Jeannie Fend has been approved (Key: [id number]).    Effective dates: 02/08/23 through 02/08/24  Patients co-pay is $1413.00. Will work on obtaining grant to decrease cost of medication.   Karle Plumber, PharmD, BCPS, BCCP, CPP Heart Failure Clinic Pharmacist [phone]

## 2023-02-08 NOTE — Telephone Encounter (Signed)
Advanced Heart Failure Patient Advocate Encounter  The patient was approved for a [employer] grant that will help cover the cost of Vyndamax. Total amount awarded, $10,000. Eligibility, 01/09/23 - 01/08/24.  ID [id number]  BIN [id number]  PCN [id number]  Group [id number]  Billing information added to WAM.   Archer Asa, CPhT

## 2023-02-09 ENCOUNTER — Encounter (HOSPITAL_COMMUNITY): Payer: Self-pay

## 2023-02-09 ENCOUNTER — Other Ambulatory Visit (HOSPITAL_COMMUNITY): Payer: Self-pay | Admitting: Pharmacist

## 2023-02-09 ENCOUNTER — Other Ambulatory Visit (HOSPITAL_COMMUNITY): Payer: Self-pay | Admitting: Pharmacy Technician

## 2023-02-09 ENCOUNTER — Other Ambulatory Visit: Payer: Self-pay

## 2023-02-09 ENCOUNTER — Other Ambulatory Visit (HOSPITAL_COMMUNITY): Payer: Self-pay

## 2023-02-09 MED ORDER — VYNDAMAX 61 MG PO CAPS
61.0000 mg | ORAL_CAPSULE | Freq: Every day | ORAL | 11 refills | Status: DC
  Filled 2023-02-09: qty 30, 30d supply, fill #0
  Filled 2023-03-02: qty 30, 30d supply, fill #1
  Filled 2023-04-05: qty 30, 30d supply, fill #2
  Filled 2023-05-06: qty 30, 30d supply, fill #3
  Filled 2023-06-03: qty 30, 30d supply, fill #4
  Filled 2023-07-04 – 2023-07-05 (×2): qty 30, 30d supply, fill #5
  Filled 2023-08-09: qty 30, 30d supply, fill #6
  Filled 2023-09-02 – 2023-09-16 (×3): qty 30, 30d supply, fill #7
  Filled 2023-10-11: qty 30, 30d supply, fill #8
  Filled 2023-11-07: qty 30, 30d supply, fill #9
  Filled 2023-12-08: qty 30, 30d supply, fill #10
  Filled 2024-01-03: qty 30, 30d supply, fill #11

## 2023-02-09 NOTE — Progress Notes (Signed)
Specialty Pharmacy Initial Fill Coordination Note  Reddington Grieco is a 78 y.o. male contacted today regarding initial fill of specialty medication(s) Tafamidis [name]   Patient requested Delivery   Delivery date: 02/11/23   Verified address: [address], [place], [address]   Medication will be filled on 01/23.   Patient is aware of $1413 copayment. [employer] grant brings co-pay to $0, added to WAM.  Archer Asa, CPhT

## 2023-02-09 NOTE — Progress Notes (Signed)
Specialty Pharmacy Initiation Note   Joel Duarte is a 78 y.o. male who will be followed by the specialty pharmacy service for RxSp Cardiology    Review of administration, indication, effectiveness, safety, potential side effects, storage/disposable, and missed dose instructions occurred today for patient's specialty medication(s) [name]     Patient/Caregiver did not have any additional questions or concerns.   Patient's therapy is appropriate to: Initiate    Goals Addressed             This Visit's Progress    Maintain optimal adherence to therapy       Patient is initiating therapy. Patient will maintain adherence         [name] Specialty Pharmacist

## 2023-02-15 LAB — PROTEIN ELECTROPHORESIS, URINE REFLEX
Albumin ELP, Urine: 61.2 %
Alpha-1-Globulin, U: 3.9 %
Alpha-2-Globulin, U: 5.3 %
Beta Globulin, U: 12.4 %
Gamma Globulin, U: 17.1 %
Protein, Ur: 105.1 mg/dL

## 2023-03-02 ENCOUNTER — Telehealth: Payer: Self-pay | Admitting: Cardiology

## 2023-03-02 ENCOUNTER — Other Ambulatory Visit: Payer: Self-pay

## 2023-03-02 ENCOUNTER — Other Ambulatory Visit (HOSPITAL_COMMUNITY): Payer: Self-pay

## 2023-03-02 DIAGNOSIS — I4891 Unspecified atrial fibrillation: Secondary | ICD-10-CM

## 2023-03-02 MED ORDER — APIXABAN 5 MG PO TABS: 5.0000 mg | ORAL_TABLET | Freq: Two times a day (BID) | ORAL | 1 refills | Status: DC

## 2023-03-02 MED ORDER — APIXABAN 5 MG PO TABS
5.0000 mg | ORAL_TABLET | Freq: Two times a day (BID) | ORAL | 5 refills | Status: DC
Start: 2023-03-02 — End: 2023-11-21

## 2023-03-02 NOTE — Telephone Encounter (Signed)
Prescription refill request for Eliquis received. Indication: Afib  Last office visit:01/26/23 Joel Duarte)  Scr:  2.40 (08/10/22)  Age: 78 Weight: 96.7kg  Appropriate dose. Refill sent.

## 2023-03-02 NOTE — Addendum Note (Signed)
Addended by: Betsy Coder B on: 03/02/2023 03:11 PM   Modules accepted: Orders

## 2023-03-02 NOTE — Telephone Encounter (Signed)
*  STAT* If patient is at the pharmacy, call can be transferred to refill team.   1. Which medications need to be refilled? (please list name of each medication and dose if known) apixaban (ELIQUIS) 5 MG TABS tablet   2. Which pharmacy/location (including street and city if local pharmacy) is medication to be sent to?  3. Do they need a 30 day or 90 day supply? 30

## 2023-03-02 NOTE — Progress Notes (Signed)
Specialty Pharmacy Refill Coordination Note  Joel Duarte is a 78 y.o. male contacted today regarding refills of specialty medication(s) Tafamidis [name]   Patient requested Delivery   Delivery date: 03/10/23   Verified address: [address] [place], [place] [address]   Medication will be filled on 03/09/2023.

## 2023-03-03 ENCOUNTER — Ambulatory Visit (HOSPITAL_COMMUNITY)
Admission: RE | Admit: 2023-03-03 | Discharge: 2023-03-03 | Disposition: A | Discharge status: Home / Self Care | Payer: PPO | Source: Ambulatory Visit | Attending: Cardiology | Admitting: Cardiology

## 2023-03-03 ENCOUNTER — Encounter (HOSPITAL_COMMUNITY): Payer: Self-pay | Admitting: Cardiology

## 2023-03-03 VITALS — BP 120/80 | HR 68 | Wt 216.4 lb

## 2023-03-03 DIAGNOSIS — E854 Organ-limited amyloidosis: Secondary | ICD-10-CM | POA: Diagnosis not present

## 2023-03-03 DIAGNOSIS — Z7901 Long term (current) use of anticoagulants: Secondary | ICD-10-CM | POA: Diagnosis not present

## 2023-03-03 DIAGNOSIS — I43 Cardiomyopathy in diseases classified elsewhere: Secondary | ICD-10-CM | POA: Insufficient documentation

## 2023-03-03 DIAGNOSIS — I5022 Chronic systolic (congestive) heart failure: Secondary | ICD-10-CM | POA: Diagnosis present

## 2023-03-03 DIAGNOSIS — Z79899 Other long term (current) drug therapy: Secondary | ICD-10-CM | POA: Diagnosis not present

## 2023-03-03 DIAGNOSIS — I4811 Longstanding persistent atrial fibrillation: Secondary | ICD-10-CM | POA: Diagnosis not present

## 2023-03-03 DIAGNOSIS — I5082 Biventricular heart failure: Secondary | ICD-10-CM | POA: Insufficient documentation

## 2023-03-03 DIAGNOSIS — Z7985 Long-term (current) use of injectable non-insulin antidiabetic drugs: Secondary | ICD-10-CM | POA: Insufficient documentation

## 2023-03-03 DIAGNOSIS — N1832 Chronic kidney disease, stage 3b: Secondary | ICD-10-CM | POA: Diagnosis not present

## 2023-03-03 LAB — BRAIN NATRIURETIC PEPTIDE: B Natriuretic Peptide: 1767.3 pg/mL — ABNORMAL HIGH (ref 0.0–100.0)

## 2023-03-03 LAB — BASIC METABOLIC PANEL
Anion gap: 10 (ref 5–15)
BUN: 52 mg/dL — ABNORMAL HIGH (ref 8–23)
CO2: 24 mmol/L (ref 22–32)
Calcium: 8.9 mg/dL (ref 8.9–10.3)
Chloride: 108 mmol/L (ref 98–111)
Creatinine, Ser: 2.1 mg/dL — ABNORMAL HIGH (ref 0.61–1.24)
GFR, Estimated: 32 mL/min — ABNORMAL LOW (ref >60)
Glucose, Bld: 101 mg/dL — ABNORMAL HIGH (ref 70–99)
Potassium: 3.5 mmol/L (ref 3.5–5.1)
Sodium: 142 mmol/L (ref 135–145)

## 2023-03-03 NOTE — Progress Notes (Signed)
ADVANCED HEART FAILURE FOLLOW UP CLINIC NOTE  Referring Physician: [name], MD  Primary Care: Joel Bender, MD Primary Cardiologist:  HPI: Joel Duarte is a 78 y.o. 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      Echocardiogram obtained on 04/06/2021 showed EF 25 to 30%, speckled pattern suggestive of infiltrative cardiomyopathy, severe LVH, severely dilated left atrium, moderate MR, moderate TR, RVSP 72 mmHg. CAC sent which showed an elevated calcium score, stress testing did not show evidence of reversible ischemia. Given advanced CKD as well as lack of angina did not pursue further testing.    He underwent PYP scan that was markedly positive and was referred to HF clinic for further management. TTR genetic testing was also found to be positive and patient was started on tafamadis.      SUBJECTIVE:  Patient feels that overall he is doing well, he is not significantly limited by shortness of breath on exertion.  He does report some occasional diarrhea, which he thinks started around the time he started the tafamidis.  He takes the torsemide 10 mg daily, with around weekly metolazone.  His weight is not largely changed since his last visit.  He did have recent eye surgery for a detached retina.  No family history of cardiac disease.  Continues to deny any neuropathy symptoms.  PMH, current medications, allergies, social history, and family history reviewed in epic.  PHYSICAL EXAM: Vitals:   03/03/23 1406  BP: 120/80  Pulse: 68  SpO2: 98%   GENERAL: Well nourished and in no apparent distress at rest.  PULM:  Normal work of breathing, clear to auscultation bilaterally. Respirations are unlabored.  CARDIAC:  JVP: Elevated to the level of the earlobe with positive hepatojugular reflex         Irregular rate and rhythm. Systolic murmur.  Trace edema. Warm and well perfused extremities. ABDOMEN: Mildly distended NEUROLOGIC: Patient is  oriented x3 with no focal or lateralizing neurologic deficits.    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 Genetics positive for ATTR, copy provided to patient and screening of children discussed. He does appear volume overloaded on exam based on severely elevated JVP and positive HJR. Will obtain BMP/BNP, if elevated and creatinine stable would start spironolactone plus increase daily torsemide. Tolerating tafamadis well. - Continue SGLT-2, would prioritize even given low GFR - Increase to 20mg  after labs result - AL labs negative - TTR genetic panel reviewed  - Continue afamadis - Continue prn metolazone - Start spironolactone 12.5mg  daily - Careful control of salt intake - Continue carvedilol 6.25mg  BID  - ICD discussion  at next visit - Low threshold for RHC   Atrial fibrillation: Long standing persistent, patient not currently interested in cardioversion. Unlikley to be durable given LA enlargement, but if he worsens would consider. Would likely need amiodarone in that  case. - Continue anticoagulation   CAC: Noted on CT scan, no anginal symptoms currently. Would only pursue cardiac cath if he were to develop symptoms. - Statin   CKD stage IIIB: Unfortuantely a poor prognostic sign, tolerating diuresis well currently. - Follows with nephrology  Follow up in 1 month  Joel Hasten, MD Advanced Heart Failure Mechanical Circulatory Support 03/03/23

## 2023-03-03 NOTE — Patient Instructions (Signed)
Good to see you today!  Labs done today, your results will be available in MyChart, we will contact you for abnormal readings.   Your physician recommends that you schedule a follow-up appointment in: 2 months( April) Call  office in end of February to schedule an appointment  If you have any questions or concerns before your next appointment please send [place] a message through [place] or call our office at [phone].    TO LEAVE A MESSAGE FOR THE NURSE SELECT OPTION 2, PLEASE LEAVE A MESSAGE INCLUDING: YOUR NAME DATE OF BIRTH CALL BACK NUMBER REASON FOR CALL**this is important as we prioritize the call backs  YOU WILL RECEIVE A CALL BACK THE SAME DAY AS LONG AS YOU CALL BEFORE 4:00 PM  At the [facility], you and your health needs are our priority. As part of our continuing mission to provide you with exceptional heart care, we have created designated Provider Care Teams. These Care Teams include your primary Cardiologist (physician) and Advanced Practice Providers (APPs- Physician Assistants and Nurse Practitioners) who all work together to provide you with the care you need, when you need it.   You may see any of the following providers on your designated Care Team at your next follow up: Dr Arvilla Meres Dr Marca Ancona Dr. Dorthula Nettles Dr. Clearnce Hasten Amy Filbert Schilder, NP Robbie Lis, [place] [facility] [place], [place] Brynda Peon, NP [place] Lee, NP Karle Plumber, PharmD   Please be sure to bring in all your medications bottles to every appointment.    Thank you for choosing [facility]

## 2023-03-07 ENCOUNTER — Encounter: Payer: Self-pay | Admitting: Cardiology

## 2023-03-07 DIAGNOSIS — I5041 Acute combined systolic (congestive) and diastolic (congestive) heart failure: Secondary | ICD-10-CM

## 2023-03-07 DIAGNOSIS — I429 Cardiomyopathy, unspecified: Secondary | ICD-10-CM

## 2023-03-08 ENCOUNTER — Other Ambulatory Visit (HOSPITAL_COMMUNITY): Payer: Self-pay | Admitting: Cardiology

## 2023-03-08 ENCOUNTER — Other Ambulatory Visit: Payer: Self-pay

## 2023-03-08 MED ORDER — SPIRONOLACTONE 25 MG PO TABS: 12.5000 mg | ORAL_TABLET | Freq: Every day | ORAL | 3 refills | Status: DC

## 2023-03-08 MED ORDER — TORSEMIDE 20 MG PO TABS: 20.0000 mg | ORAL_TABLET | Freq: Every morning | ORAL | 3 refills | Status: DC

## 2023-03-08 NOTE — Telephone Encounter (Signed)
-----   Message from Romie Minus sent at 03/07/2023  4:34 PM EST ----- Can we reach out and have the patient start taking spironolactone 12.5mg  daily as well as increase the torsemide to 20mg  daily?

## 2023-03-08 NOTE — Progress Notes (Signed)
 Patient was contacted via mychart that due to possible impending winter storm, medication will arrive on Wednesday 03/09/23

## 2023-03-11 ENCOUNTER — Ambulatory Visit: Payer: Self-pay | Admitting: Cardiology

## 2023-03-22 ENCOUNTER — Other Ambulatory Visit: Payer: Self-pay

## 2023-03-24 ENCOUNTER — Emergency Department (HOSPITAL_BASED_OUTPATIENT_CLINIC_OR_DEPARTMENT_OTHER)
Admission: EM | Admit: 2023-03-24 | Discharge: 2023-03-24 | Disposition: A | Discharge status: Home / Self Care | Attending: Emergency Medicine | Admitting: Emergency Medicine

## 2023-03-24 ENCOUNTER — Other Ambulatory Visit: Payer: Self-pay

## 2023-03-24 ENCOUNTER — Other Ambulatory Visit (HOSPITAL_COMMUNITY): Payer: Self-pay

## 2023-03-24 ENCOUNTER — Emergency Department (HOSPITAL_BASED_OUTPATIENT_CLINIC_OR_DEPARTMENT_OTHER)

## 2023-03-24 DIAGNOSIS — N400 Enlarged prostate without lower urinary tract symptoms: Secondary | ICD-10-CM | POA: Insufficient documentation

## 2023-03-24 DIAGNOSIS — R944 Abnormal results of kidney function studies: Secondary | ICD-10-CM | POA: Diagnosis not present

## 2023-03-24 DIAGNOSIS — Z79899 Other long term (current) drug therapy: Secondary | ICD-10-CM | POA: Insufficient documentation

## 2023-03-24 DIAGNOSIS — K4090 Unilateral inguinal hernia, without obstruction or gangrene, not specified as recurrent: Secondary | ICD-10-CM | POA: Insufficient documentation

## 2023-03-24 DIAGNOSIS — R109 Unspecified abdominal pain: Secondary | ICD-10-CM | POA: Diagnosis present

## 2023-03-24 DIAGNOSIS — Z7901 Long term (current) use of anticoagulants: Secondary | ICD-10-CM | POA: Insufficient documentation

## 2023-03-24 LAB — URINALYSIS, ROUTINE W REFLEX MICROSCOPIC
Bacteria, UA: NONE SEEN
Bilirubin Urine: NEGATIVE
Glucose, UA: >1000 mg/dL — AB
Hgb urine dipstick: NEGATIVE
Ketones, ur: NEGATIVE mg/dL
Leukocytes,Ua: NEGATIVE
Nitrite: NEGATIVE
Protein, ur: 30 mg/dL — AB
Specific Gravity, Urine: 1.022 (ref 1.005–1.030)
pH: 5.5 (ref 5.0–8.0)

## 2023-03-24 LAB — COMPREHENSIVE METABOLIC PANEL
ALT: 9 U/L (ref 0–44)
AST: 16 U/L (ref 15–41)
Albumin: 4 g/dL (ref 3.5–5.0)
Alkaline Phosphatase: 59 U/L (ref 38–126)
Anion gap: 10 (ref 5–15)
BUN: 37 mg/dL — ABNORMAL HIGH (ref 8–23)
CO2: 23 mmol/L (ref 22–32)
Calcium: 8.9 mg/dL (ref 8.9–10.3)
Chloride: 112 mmol/L — ABNORMAL HIGH (ref 98–111)
Creatinine, Ser: 2.01 mg/dL — ABNORMAL HIGH (ref 0.61–1.24)
GFR, Estimated: 34 mL/min — ABNORMAL LOW (ref >60)
Glucose, Bld: 92 mg/dL (ref 70–99)
Potassium: 3.9 mmol/L (ref 3.5–5.1)
Sodium: 145 mmol/L (ref 135–145)
Total Bilirubin: 2.8 mg/dL — ABNORMAL HIGH (ref 0.0–1.2)
Total Protein: 6.7 g/dL (ref 6.5–8.1)

## 2023-03-24 LAB — CBC
HCT: 39 % (ref 39.0–52.0)
Hemoglobin: 12.5 g/dL — ABNORMAL LOW (ref 13.0–17.0)
MCH: 27.8 pg (ref 26.0–34.0)
MCHC: 32.1 g/dL (ref 30.0–36.0)
MCV: 86.7 fL (ref 80.0–100.0)
Platelets: 123 10*3/uL — ABNORMAL LOW (ref 150–400)
RBC: 4.5 MIL/uL (ref 4.22–5.81)
RDW: 17.8 % — ABNORMAL HIGH (ref 11.5–15.5)
WBC: 4.4 10*3/uL (ref 4.0–10.5)
nRBC: 0 % (ref 0.0–0.2)

## 2023-03-24 LAB — LIPASE, BLOOD: Lipase: 16 U/L (ref 11–51)

## 2023-03-24 NOTE — ED Notes (Signed)
 Discharge instructions reviewed.   Opportunity for questions and concerns provided.   Alert, oriented and ambulatory. Displays no signs of distress.   Encouraged to follow up with urologist sooner than scheduled appointment.

## 2023-03-24 NOTE — ED Triage Notes (Signed)
 Pt POV from home reporting abd pain x2 weeks, abd hernia, called PCP and advised to come to ED for xray. Denies n/v, some diarrhea today.

## 2023-03-24 NOTE — ED Provider Notes (Signed)
 [place] EMERGENCY DEPARTMENT AT [facility] Provider Note   CSN: [id number] Arrival date & time: 03/24/23  1407     History Chief Complaint  Patient presents with   Abdominal Pain    Jasiel Belisle is a 78 y.o. male patient who presents to the emergency department today for further evaluation of abdominal pain after eating.  Patient was seen evaluated by his primary care doctor who sent him here for an x-ray.  Patient does have a known right inguinal hernia.  He has not had any issues with it and does plan on getting it fixed.  Denies any nausea, vomiting, fever, diarrhea, chills.   Abdominal Pain      Home Medications Prior to Admission medications   Medication Sig Start Date End Date Taking? Authorizing Provider  apixaban (ELIQUIS) 5 MG TABS tablet Take 1 tablet (5 mg total) by mouth 2 (two) times daily. 03/02/23   Tolia, Sunit, DO  atorvastatin (LIPITOR) 40 MG tablet Take 1 tablet (40 mg total) by mouth at bedtime. 10/21/21 01/26/24  Tolia, Sunit, DO  azelastine (ASTELIN) 0.1 % nasal spray Place 2 sprays into both nostrils as needed for rhinitis. Use in each nostril as directed    [provider]  carvedilol (COREG) 6.25 MG tablet Take 1 tablet (6.25 mg total) by mouth 2 (two) times daily. 01/26/23   Romie Minus, MD  dapagliflozin propanediol (FARXIGA) 10 MG TABS tablet Take 1 tablet (10 mg total) by mouth daily. 11/23/22   Tolia, Sunit, DO  doxazosin (CARDURA) 8 MG tablet Take 1 tablet (8 mg total) by mouth in the morning AND 0.5 tablets (4 mg total) at bedtime. 06/16/21     dutasteride (AVODART) 0.5 MG capsule Take 1 capsule (0.5 mg total) by mouth daily. 03/29/22   McKenzie, Mardene Celeste, MD  FEBUXOSTAT PO Take 40 mg by mouth daily.    [provider]  fluticasone (FLONASE) 50 MCG/ACT nasal spray Place 1 spray into both nostrils 2 (two) times daily as needed. 12/17/21     levETIRAcetam (KEPPRA) 750 MG tablet Take 1 tablet (750 mg total) by mouth 2 (two)  times daily. 11/11/22   Tat, Octaviano Batty, DO  metolazone (ZAROXOLYN) 2.5 MG tablet Take 2.5 mg by mouth as needed.    [provider]  spironolactone (ALDACTONE) 25 MG tablet Take 0.5 tablets (12.5 mg total) by mouth daily. 03/08/23 06/06/23  Romie Minus, MD  Tafamidis [facility]) 61 MG CAPS Take 1 capsule (61 mg total) by mouth daily. 02/09/23   Romie Minus, MD  torsemide (DEMADEX) 20 MG tablet Take 1 tablet (20 mg total) by mouth every morning. 03/08/23   Romie Minus, MD  benazepril (LOTENSIN) 40 MG tablet TAKE 1 TABLET BY MOUTH DAILY 12/25/19 07/25/20  [name], MD  NIFEdipine (PROCARDIA) 10 MG capsule Take 1 capsule (10 mg total) by mouth 2 (two) times daily. 07/23/20 07/25/20        Allergies    Patient has no known allergies.    Review of Systems   Review of Systems  Gastrointestinal:  Positive for abdominal pain.  All other systems reviewed and are negative.   Physical Exam Updated Vital Signs BP (!) 153/91   Pulse (!) 43   Temp 98.2 F (36.8 C)   Resp 16   Ht 6\' 2"  (1.88 m)   Wt 90.3 kg   SpO2 99%   BMI 25.55 kg/m  Physical Exam Vitals and nursing note reviewed.  Constitutional:  General: He is not in acute distress.    Appearance: Normal appearance.  HENT:     Head: Normocephalic and atraumatic.  Eyes:     General:        Right eye: No discharge.        Left eye: No discharge.  Cardiovascular:     Comments: Regular rate and rhythm.  S1/S2 are distinct without any evidence of murmur, rubs, or gallops.  Radial pulses are 2+ bilaterally.  Dorsalis pedis pulses are 2+ bilaterally.  No evidence of pedal edema. Pulmonary:     Comments: Clear to auscultation bilaterally.  Normal effort.  No respiratory distress.  No evidence of wheezes, rales, or rhonchi heard throughout. Abdominal:     General: Abdomen is flat. Bowel sounds are normal. There is no distension.     Tenderness: There is no abdominal tenderness. There is no guarding or rebound.   Genitourinary:    Comments: Large reducible right inguinal hernia. Musculoskeletal:        General: Normal range of motion.     Cervical back: Neck supple.  Skin:    General: Skin is warm and dry.     Findings: No rash.  Neurological:     General: No focal deficit present.     Mental Status: He is alert.  Psychiatric:        Mood and Affect: Mood normal.        Behavior: Behavior normal.     ED Results / Procedures / Treatments   Labs (all labs ordered are listed, but only abnormal results are displayed) Labs Reviewed  COMPREHENSIVE METABOLIC PANEL - Abnormal; Notable for the following components:      Result Value   Chloride 112 (*)    BUN 37 (*)    Creatinine, Ser 2.01 (*)    Total Bilirubin 2.8 (*)    GFR, Estimated 34 (*)    All other components within normal limits  CBC - Abnormal; Notable for the following components:   Hemoglobin 12.5 (*)    RDW 17.8 (*)    Platelets 123 (*)    All other components within normal limits  URINALYSIS, ROUTINE W REFLEX MICROSCOPIC - Abnormal; Notable for the following components:   Glucose, UA >1,000 (*)    Protein, ur 30 (*)    All other components within normal limits  LIPASE, BLOOD    EKG None  Radiology CT ABDOMEN PELVIS WO CONTRAST Result Date: 03/24/2023 CLINICAL DATA:  Generalized abdominal pain for 2 weeks. EXAM: CT ABDOMEN AND PELVIS WITHOUT CONTRAST TECHNIQUE: Multidetector CT imaging of the abdomen and pelvis was performed following the standard protocol without IV contrast. RADIATION DOSE REDUCTION: This exam was performed according to the departmental dose-optimization program which includes automated exposure control, adjustment of the mA and/or kV according to patient size and/or use of iterative reconstruction technique. COMPARISON:  None Available. FINDINGS: Lower chest: Minimal bilateral pleural effusions are noted. Hepatobiliary: Minimal cholelithiasis. No biliary dilatation. No focal hepatic abnormality is noted  on these unenhanced images. Pancreas: Unremarkable. No pancreatic ductal dilatation or surrounding inflammatory changes. Spleen: Normal in size without focal abnormality. Adrenals/Urinary Tract: Adrenal glands appear normal. Severe right renal atrophy is noted. Left renal cyst is noted 40 no further follow-up is required. No hydronephrosis or renal obstruction is noted. Severely enlarged prostate gland is seen extending into bladder lumen consistent with history of prostatic carcinoma or hyperplasia. Stomach/Bowel: The stomach is unremarkable. There is no evidence of bowel obstruction or inflammation. Vascular/Lymphatic:  Aortic atherosclerosis. No enlarged abdominal or pelvic lymph nodes. Reproductive: As noted above, severe prostatic enlargement is noted consistent with history of prostatic carcinoma and hyperplasia. Other: Mild ascites is noted. Moderate size fluid-filled right inguinal hernia is noted. Musculoskeletal: No acute or significant osseous findings. IMPRESSION: Minimal pleural effusions. Minimal cholelithiasis. Severely enlarged prostate gland is noted extending into bladder lumen consistent with history of prostatic carcinoma or hyperplasia. Severe right renal atrophy. Mild ascites is noted. Moderate size fluid-filled right inguinal hernia. Aortic Atherosclerosis (ICD10-I70.0). Electronically Signed   By: Lupita Raider M.D.   On: 03/24/2023 18:22    Procedures Procedures    Medications Ordered in ED Medications - No data to display  ED Course/ Medical Decision Making/ A&P Clinical Course as of 03/24/23 1906  Thu Mar 24, 2023  1900 CT ABDOMEN PELVIS WO CONTRAST Personally ordered interpreted the study and do not see any evidence of incarcerated hernia.  I do agree with radiology interpretation.  There is evidence of a severely enlarged prostate which patient knows about. [CF]  1901 CBC(!) Negative. [CF]  1901 Lipase, blood Negative. [CF]  1901 Comprehensive metabolic  panel(!) Elevated creatinine. [CF]  1901 Urinalysis, Routine w reflex microscopic -Urine, Clean Catch(!) Negative. [CF]    Clinical Course User Index [CF] Teressa Lower, PA-C   {   Click here for ABCD2, HEART and other calculators  Medical Decision Making Alie Hardgrove is a 78 y.o. male patient who presents to the emergency department today for further evaluation of abdominal pain while eating.  Given the findings of the CT scan patient does have some mild ascites which could be contributing.  The right inguinal hernia could also be contributing to this.  I am a little concerned about the patient's prostate.  He last saw his urologist back in September.  Findings are concerning for cancer.  Patient states that as far as he knows it was unequivocal.  I will urge him to follow-up with his urologist little bit sooner.  He does have an appointment in April.  Low suspicion for incarcerated hernia today.  Strict turn precautions were discussed.  He is safe for discharge at this time.  I will have him follow-up with his urologist and primary care doctor.  Patient and wife are at bedside and are agreeable with plan.   Amount and/or Complexity of Data Reviewed Labs: ordered. Decision-making details documented in ED Course. Radiology: ordered. Decision-making details documented in ED Course.    Final Clinical Impression(s) / ED Diagnoses Final diagnoses:  Enlarged prostate  Right inguinal hernia    Rx / DC Orders ED Discharge Orders     None         Teressa Lower, PA-C 03/24/23 1906    Rexford Maus, DO 03/24/23 1958

## 2023-03-24 NOTE — Discharge Instructions (Signed)
 As we discussed, I would talk to your urologist about your prostate little bit sooner.  I know you have an appointment in April.  Please return to the emergency department for any worsening symptoms.

## 2023-03-28 ENCOUNTER — Ambulatory Visit: Payer: Medicare HMO | Attending: Cardiology | Admitting: Cardiology

## 2023-03-28 ENCOUNTER — Encounter: Payer: Self-pay | Admitting: Cardiology

## 2023-03-28 VITALS — BP 130/82 | HR 64 | Resp 16 | Ht 74.0 in | Wt 208.4 lb

## 2023-03-28 DIAGNOSIS — R69 Illness, unspecified: Secondary | ICD-10-CM

## 2023-03-28 DIAGNOSIS — E7800 Pure hypercholesterolemia, unspecified: Secondary | ICD-10-CM

## 2023-03-28 DIAGNOSIS — I429 Cardiomyopathy, unspecified: Secondary | ICD-10-CM | POA: Diagnosis not present

## 2023-03-28 DIAGNOSIS — I4819 Other persistent atrial fibrillation: Secondary | ICD-10-CM | POA: Diagnosis not present

## 2023-03-28 DIAGNOSIS — I5041 Acute combined systolic (congestive) and diastolic (congestive) heart failure: Secondary | ICD-10-CM | POA: Diagnosis not present

## 2023-03-28 DIAGNOSIS — I2510 Atherosclerotic heart disease of native coronary artery without angina pectoris: Secondary | ICD-10-CM

## 2023-03-28 DIAGNOSIS — I10 Essential (primary) hypertension: Secondary | ICD-10-CM

## 2023-03-28 DIAGNOSIS — E854 Organ-limited amyloidosis: Secondary | ICD-10-CM | POA: Diagnosis not present

## 2023-03-28 DIAGNOSIS — I2584 Coronary atherosclerosis due to calcified coronary lesion: Secondary | ICD-10-CM

## 2023-03-28 DIAGNOSIS — I43 Cardiomyopathy in diseases classified elsewhere: Secondary | ICD-10-CM

## 2023-03-28 NOTE — Patient Instructions (Signed)
 Medication Instructions:  Your physician recommends that you continue on your current medications as directed. Please refer to the Current Medication list given to you today.  *If you need a refill on your cardiac medications before your next appointment, please call your pharmacy*  Lab Work: To be completed this today: BMP, Pro-BNP, magnesium   If you have labs (blood work) drawn today and your tests are completely normal, you will receive your results only by: MyChart Message (if you have MyChart) OR A paper copy in the mail If you have any lab test that is abnormal or we need to change your treatment, we will call you to review the results.  Testing/Procedures: None ordered today.  Follow-Up: At [facility], you and your health needs are our priority.  As part of our continuing mission to provide you with exceptional heart care, we have created designated Provider Care Teams.  These Care Teams include your primary Cardiologist (physician) and Advanced Practice Providers (APPs -  Physician Assistants and Nurse Practitioners) who all work together to provide you with the care you need, when you need it.  We recommend signing up for the patient portal called "MyChart".  Sign up information is provided on this After Visit Summary.  MyChart is used to connect with patients for Virtual Visits (Telemedicine).  Patients are able to view lab/test results, encounter notes, upcoming appointments, etc.  Non-urgent messages can be sent to your provider as well.   To learn more about what you can do with MyChart, go to [email or web address].    Your next appointment:   6 month(s)  The format for your next appointment:   In Person  Provider:   Tessa Lerner, DO {  Other Instructions Please be sure to see Dr. Elwyn Lade in 1 month.

## 2023-03-28 NOTE — Progress Notes (Signed)
 Cardiology Office Note:  .   Date:  03/28/2023  ID:  Joel Duarte, DOB 06/07/1945, MRN [id number] PCP:  Joel Bender, MD  Former Cardiology Providers: NA [facility] Providers Cardiologist:  Joel Lerner, DO, [facility] (established care April 03, 2021)  Advanced Heart Failure:  Joel Minus, MD  Electrophysiologist:  None  Click to update primary MD,subspecialty MD or APP then REFRESH:1}    Chief Complaint  Patient presents with   Persistent atrial fibrillation    heart failure management   Cardiomyopathy   Follow-up    6 months    History of Present Illness: .   Joel Duarte is a 78 y.o. African-American male whose past medical history and cardiovascular risk factors includes: Cardiac amyloidosis, atrial fibrillation, cardiomyopathy, chronic combined systolic and diastolic heart failure, severe coronary artery calcification (total CAC 1130/93rd percentile), elevated LP(a), hypertension with chronic kidney disease stage 3b/4, erectile dysfunction, gout, history of seizures, central sleep apnea on CPAP, prediabetes, prostate cancer, pulmonary nodules, advanced age.   Due to his severe coronary artery calcification he did undergo ischemic work-up which included a stress test in April 2023. His stress test was reported to be high risk due to reduced LVEF, dilated LV cavity, but no definitive evidence of reversible ischemia. Given his progression of CKD and being asymptomatic (no anginal discomfort) shared decision was to monitor him clinically to prevent the risk of worsening renal function leading to dialysis. However, on multiple office visits he has been educated that if he develops chest pain concerning for anginal discomfort he needs to seek medical attention by going to the closest ER via EMS. If he has symptoms of ACS angiography is warranted irrespective of the renal function.   With regards to his atrial fibrillation patient is on anticoagulation for thromboembolic prophylaxis.     Since last office visit patient has been evaluated by advanced heart failure started on treatment for cardiac amyloidosis.   His PYP study was markedly positive and TTR gene testing was also positive.  Patient stated since starting tafamidis he has been doing well but no significant change in quality of life.  Patient states that his weight has remained relatively stable but feels that he is losing muscle mass.  He does not endorse evidence of bleeding.    Review of Systems: .   Review of Systems  Cardiovascular:  Positive for dyspnea on exertion (Chronic and stable) and leg swelling. Negative for chest pain, claudication, irregular heartbeat, near-syncope, orthopnea, palpitations, paroxysmal nocturnal dyspnea and syncope.  Respiratory:  Positive for shortness of breath (Chronic and stable).   Hematologic/Lymphatic: Negative for bleeding problem.  Musculoskeletal:  Negative for muscle cramps and myalgias.  Neurological:  Negative for dizziness and light-headedness.    Studies Reviewed:   Echocardiogram: 04/10/2021:  Left ventricle cavity is normal in size. Severe concentric hypertrophy of the left ventricle. Speckled pattern suggests infiltrative cardiomyopathy.  Hypokinetic global wall motion. Doppler evidence of grade I (impaired) diastolic dysfunction, normal LAP. Severely depressed LV systolic function with visual EF 25-30%. Calculated EF 30%.  Left atrial cavity is severely dilated at 5.2 cm. Right atrial cavity is mildly dilated.  Right ventricle cavity is normal in size. Mild concentric hypertrophy of the right ventricle. Severely reduced right ventricular function.  Trileaflet aortic valve.  Trace aortic regurgitation.  Structurally normal mitral valve.  Moderate (Grade II) mitral regurgitation.  Structurally normal tricuspid valve.  Moderate tricuspid regurgitation.  Severe pulmonary hypertension. RVSP measures 72 mmHg.  Pericardium is normal.  Insignificant pericardial  effusion.  IVC is dilated with poor inspiration collapse consistent with elevated right atrial pressure.   Stress Testing: Exercise/Lexiscan Tetrofosmin stress test 05/06/2021:  SPECT images show small area of mild intensity, fixed perfusion defect in basal inferior myocardium. No definite evidence of ischemia.High risk study in the setting of dilated cardiomyopathy.   CT Cardiac Scoring 04/13/2021 1. Coronary calcium score is 1130 and this is at percentile 93 for patients of the same age, gender and ethnicity. 2.  See report for additional details  RADIOLOGY: NA  Risk Assessment/Calculations:   Click Here to Calculate/Change CHADS2VASc Score The patient's CHADS2-VASc score is 4, indicating a 4.8% annual risk of stroke.  Therefore, anticoagulation is recommended.   CHF History: Yes HTN History: Yes Diabetes History: No Stroke History: No Vascular Disease History: No    Labs:    External Labs: Collected: December 01, 2020 Sodium 144, potassium 4.9, chloride 108, bicarb 26. AST 17, ALT 12, alkaline phosphatase 82. Hemoglobin 12 g/dL, hematocrit 16.1%. BNP 362. NT proBNP 2741. Hemoglobin A1c 6   Collected: February 19, 2021. BUN 36, creatinine 2.19 mg/dL. eGFR 31 Sodium 144, potassium 4.5, chloride 109, bicarbonate 25. AST 16, ALT 13, alkaline phosphatase 81. PSA 15. NT proBNP 3304. Uric acid 7.7     Latest Ref Rng & Units 03/24/2023    3:05 PM 08/10/2022    8:57 AM 03/27/2013   10:12 AM  CBC  WBC 4.0 - 10.5 K/uL 4.4     Hemoglobin 13.0 - 17.0 g/dL 09.6  04.5  40.9   Hematocrit 39.0 - 52.0 % 39.0  38.6  41.0   Platelets 150 - 400 K/uL 123          Latest Ref Rng & Units 03/24/2023    3:05 PM 03/03/2023    2:38 PM 08/10/2022    8:57 AM  BMP  Glucose 70 - 99 mg/dL 92  811  914   BUN 8 - 23 mg/dL 37  52  54   Creatinine 0.61 - 1.24 mg/dL 7.82  9.56  2.13   BUN/Creat Ratio 10 - 24   23   Sodium 135 - 145 mmol/L 145  142  141   Potassium 3.5 - 5.1 mmol/L 3.9  3.5   3.5   Chloride 98 - 111 mmol/L 112  108  103   CO2 22 - 32 mmol/L 23  24  21    Calcium 8.9 - 10.3 mg/dL 8.9  8.9  9.4       Latest Ref Rng & Units 03/24/2023    3:05 PM 03/03/2023    2:38 PM 08/10/2022    8:57 AM  CMP  Glucose 70 - 99 mg/dL 92  086  578   BUN 8 - 23 mg/dL 37  52  54   Creatinine 0.61 - 1.24 mg/dL 4.69  6.29  5.28   Sodium 135 - 145 mmol/L 145  142  141   Potassium 3.5 - 5.1 mmol/L 3.9  3.5  3.5   Chloride 98 - 111 mmol/L 112  108  103   CO2 22 - 32 mmol/L 23  24  21    Calcium 8.9 - 10.3 mg/dL 8.9  8.9  9.4   Total Protein 6.5 - 8.1 g/dL 6.7     Total Bilirubin 0.0 - 1.2 mg/dL 2.8     Alkaline Phos 38 - 126 U/L 59     AST 15 - 41 U/L 16     ALT 0 -  44 U/L 9       Lab Results  Component Value Date   CHOL 136 02/15/2022   HDL 57 02/15/2022   LDLCALC 67 02/15/2022   LDLDIRECT 74 02/15/2022   TRIG 56 02/15/2022   No results for input(s): "LIPOA" in the last 8760 hours. No components found for: "NTPROBNP" No results for input(s): "PROBNP" in the last 8760 hours. No results for input(s): "TSH" in the last 8760 hours.  Physical Exam:    Today's Vitals   03/28/23 1524  BP: 130/82  Pulse: 64  Resp: 16  SpO2: 95%  Weight: 208 lb 6.4 oz (94.5 kg)  Height: 6\' 2"  (1.88 m)   Body mass index is 26.76 kg/m. Wt Readings from Last 3 Encounters:  03/28/23 208 lb 6.4 oz (94.5 kg)  03/24/23 199 lb (90.3 kg)  03/03/23 216 lb 6.4 oz (98.2 kg)    Physical Exam  Constitutional: No distress.  Appears older than stated age, hemodynamically stable.   Neck: JVD present.  Cardiovascular: Normal rate, S1 normal, S2 normal, intact distal pulses and normal pulses. An irregularly irregular rhythm present. Exam reveals no gallop, no S3 and no S4.  Murmur heard. Holosystolic murmur is present with a grade of 3/6 at the apex radiating to the axilla. Pulmonary/Chest: Effort normal and breath sounds normal. No stridor. He has no wheezes. He has no rales.  Abdominal: Soft. Bowel  sounds are normal. He exhibits no distension. There is no abdominal tenderness.  Musculoskeletal:        General: Edema (Wears compression stockings.  Bilateral up to knee.) present.     Cervical back: Neck supple.  Neurological: He is alert and oriented to person, place, and time. He has intact cranial nerves (2-12).  Skin: Skin is warm and moist.   Impression & Recommendation(s):  Impression:   ICD-10-CM   1. Acute combined systolic and diastolic heart failure (HCC)  Z61.09 Basic metabolic panel    Magnesium    Pro b natriuretic peptide (BNP)    Pro b natriuretic peptide (BNP)    Magnesium    Basic metabolic panel    2. Cardiomyopathy, unspecified type (HCC)  I42.9     3. Cardiac amyloidosis (HCC)  E85.4    I43     4. Persistent atrial fibrillation (HCC)  I48.19     5. Primary hypertension  I10     6. Coronary atherosclerosis due to calcified coronary lesion  I25.10    I25.84     7. Hypercholesteremia  E78.00     8. Taking multiple medications for chronic disease  R69 allopurinol (ZYLOPRIM) 100 MG tablet    colchicine 0.6 MG tablet    zolpidem (AMBIEN) 5 MG tablet       Recommendation(s):  Acute combined systolic and diastolic heart failure (HCC) Cardiomyopathy, unspecified type (HCC) Cardiac amyloidosis (HCC) Stage B, NYHA class II/III Initially felt that his cardiomyopathy is likely driven by ischemic substrate given his severe CAC.  However additional workup notes a positive PYP scan concerning for cardiac amyloidosis.  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  Patient is aware that disease can be passed down genetically and his first-degree relatives especially kids should be evaluated. His weight when compared to prior office visit is trended down on physical examination he is volume up Continue carvedilol 6.25 mg p.o. twice daily. Continue Farxiga 10 mg p.o. daily. Continue Cardura 8 mg in the morning and 5 mg at bedtime. Continue Procardia 10 mg p.o. twice  daily. Continue spironolactone 12.5 mg p.o. daily. Continue Vyndamax 61 mg p.o. daily. Continue torsemide 20 mg p.o. every morning. Metolazone 2.5 mg as needed-patient states that he is barely using it Endorses compliance with his CPAP ACE inhibitor/ARB/Arni: Held secondary to chronic kidney disease and the risk of hyperkalemia and AKI Digoxin: Intolerant. Hydralazine/nitrates: Intolerant We discussed the role of being considered for AICD if his LVEF remains reduced.  Introduced the concept and answered his questions and concerns.  Patient states that he will think about this more and follow-up with Dr. Elwyn Lade next month. I will check NT proBNP, BMP, and magnesium level to reevaluate his renal function. He has swelling up to his knees bilaterally and I am concerned about acute decompensation. Patient states that he is compliant with his current medical therapy.  Shared decision was to hold off on up titration of diuretics until labs come back. Patient states that he is in the process of making appointment with advanced heart failure in April 2025.  Persistent atrial fibrillation (HCC) Rate control: Carvedilol. Rhythm control: N/A. Thromboembolic prophylaxis: Eliquis Does not endorse evidence of bleeding.  In the past we have discussed restoring normal sinus rhythm after being on anticoagulation for 4 weeks or TEE guided cardioversion however, patient has remained reluctant.  Primary hypertension Office blood pressures are well-controlled. Medications as discussed above Monitor for now  Coronary atherosclerosis due to calcified coronary lesion Total CAC 1130, 93rd percentile Continue statin therapy. Currently not on aspirin as he is on anticoagulation for A-fib. Had a SPECT imaging study back in 2023 which was reported to be high risk secondary to fixed perfusion defect, dilated cardiomyopathy, and reduced LVEF.  He has not undergone invasive angiography or coronary CTA to evaluate for  obstructive CAD as he is remained asymptomatic and given his renal function at higher risk of contrast-induced nephropathy and progressive CKD. Since last visit he denies anginal chest pain or heart failure symptoms  Hypercholesteremia Currently on Lipitor 40 mg p.o. nightly. Does not endorse myalgias at this time. LDL as of January 2024 74 mg/dL. Recommend repeating fasting lipids with his PCP as part of his yearly well visit  Patient states that he has an hernia that may require surgical repair and he expresses concerns with regards to undergoing anesthesia given his reduced LVEF.  Patient is understanding that if his surgery is emergent no additional risk stratification is needed and he can proceed forward.  However this is elective surgery would recommend elective hospitalization to optimize his fluid status and also consider right heart catheterization.  We also discussed the role of cardiac PET/CT in the perioperative period to reevaluate for ischemia given his severe CAC.  Of note he has done well despite a high risk study back in 2023.  In the meantime I have asked him to start ambulating as tolerated with a goal of 30 minutes a day 5 days a week.  Patient will also discuss this further with advanced heart failure team next month at this appointment.   Orders Placed:  Orders Placed This Encounter  Procedures   Basic metabolic panel    Standing Status:   Future    Number of Occurrences:   1    Expected Date:   03/28/2023    Expiration Date:   03/27/2024   Magnesium    Standing Status:   Future    Number of Occurrences:   1    Expected Date:   03/28/2023    Expiration Date:   03/27/2024  Pro b natriuretic peptide (BNP)    Standing Status:   Future    Number of Occurrences:   1    Expected Date:   03/28/2023    Expiration Date:   03/27/2024   Final Medication List:   No orders of the defined types were placed in this encounter.   There are no discontinued medications.   Current  Outpatient Medications:    allopurinol (ZYLOPRIM) 100 MG tablet, Take 100 mg by mouth daily., Disp: , Rfl:    apixaban (ELIQUIS) 5 MG TABS tablet, Take 1 tablet (5 mg total) by mouth 2 (two) times daily., Disp: 60 tablet, Rfl: 5   atorvastatin (LIPITOR) 40 MG tablet, Take 1 tablet (40 mg total) by mouth at bedtime., Disp: 90 tablet, Rfl: 0   azelastine (ASTELIN) 0.1 % nasal spray, Place 2 sprays into both nostrils as needed for rhinitis. Use in each nostril as directed, Disp: , Rfl:    carvedilol (COREG) 6.25 MG tablet, Take 1 tablet (6.25 mg total) by mouth 2 (two) times daily., Disp: 60 tablet, Rfl: 3   colchicine 0.6 MG tablet, Take 0.6 mg by mouth 2 (two) times daily., Disp: , Rfl:    dapagliflozin propanediol (FARXIGA) 10 MG TABS tablet, Take 1 tablet (10 mg total) by mouth daily., Disp: 90 tablet, Rfl: 3   doxazosin (CARDURA) 8 MG tablet, Take 1 tablet (8 mg total) by mouth in the morning AND 0.5 tablets (4 mg total) at bedtime., Disp: 135 tablet, Rfl: 2   dutasteride (AVODART) 0.5 MG capsule, Take 1 capsule (0.5 mg total) by mouth daily., Disp: 90 capsule, Rfl: 3   FEBUXOSTAT PO, Take 40 mg by mouth daily., Disp: , Rfl:    fluticasone (FLONASE) 50 MCG/ACT nasal spray, Place 1 spray into both nostrils 2 (two) times daily as needed., Disp: 16 g, Rfl: 5   levETIRAcetam (KEPPRA) 750 MG tablet, Take 1 tablet (750 mg total) by mouth 2 (two) times daily., Disp: 180 tablet, Rfl: 3   metolazone (ZAROXOLYN) 2.5 MG tablet, Take 2.5 mg by mouth as needed., Disp: , Rfl:    spironolactone (ALDACTONE) 25 MG tablet, Take 0.5 tablets (12.5 mg total) by mouth daily., Disp: 45 tablet, Rfl: 3   Tafamidis (VYNDAMAX) 61 MG CAPS, Take 1 capsule (61 mg total) by mouth daily., Disp: 30 capsule, Rfl: 11   torsemide (DEMADEX) 20 MG tablet, Take 1 tablet (20 mg total) by mouth every morning., Disp: 90 tablet, Rfl: 3   zolpidem (AMBIEN) 5 MG tablet, Take 5 mg by mouth at bedtime as needed., Disp: , Rfl:   As part of  today's office visit reviewed advanced heart failure notes from 03/03/2023, independent review of labs from 03/24/2023, PYP scan 01/20/2023, medication/prescription management, follow-up labs, coordination of care.  Consent:   NA  Disposition:   6 months sooner if needed.  Of note, he is also being followed by advanced heart failure team in conjunction.  He is more welcome to see [place] sooner if change in clinical status  His questions and concerns were addressed to his satisfaction. He voices understanding of the recommendations provided during this encounter.    Signed, Joel Lerner, DO, [facility] [place]  [facility]  [address] [place], [place] [address] 03/28/2023 10:21 PM

## 2023-03-29 LAB — BASIC METABOLIC PANEL
BUN/Creatinine Ratio: 17 (ref 10–24)
BUN: 39 mg/dL — ABNORMAL HIGH (ref 8–27)
CO2: 20 mmol/L (ref 20–29)
Calcium: 8.9 mg/dL (ref 8.6–10.2)
Chloride: 111 mmol/L — ABNORMAL HIGH (ref 96–106)
Creatinine, Ser: 2.32 mg/dL — ABNORMAL HIGH (ref 0.76–1.27)
Glucose: 96 mg/dL (ref 70–99)
Potassium: 4.2 mmol/L (ref 3.5–5.2)
Sodium: 145 mmol/L — ABNORMAL HIGH (ref 134–144)
eGFR: 28 mL/min/{1.73_m2} — ABNORMAL LOW (ref >59)

## 2023-03-29 LAB — MAGNESIUM: Magnesium: 2.3 mg/dL (ref 1.6–2.3)

## 2023-03-29 LAB — PRO B NATRIURETIC PEPTIDE: NT-Pro BNP: 19184 pg/mL — ABNORMAL HIGH (ref 0–486)

## 2023-03-30 ENCOUNTER — Telehealth (HOSPITAL_COMMUNITY): Payer: Self-pay | Admitting: Cardiology

## 2023-03-30 ENCOUNTER — Ambulatory Visit (HOSPITAL_COMMUNITY)
Admission: RE | Admit: 2023-03-30 | Discharge: 2023-03-30 | Disposition: A | Discharge status: Home / Self Care | Source: Ambulatory Visit | Attending: Cardiology | Admitting: Cardiology

## 2023-03-30 ENCOUNTER — Encounter (HOSPITAL_COMMUNITY): Payer: Self-pay | Admitting: Cardiology

## 2023-03-30 VITALS — BP 128/80 | HR 61 | Wt 208.4 lb

## 2023-03-30 DIAGNOSIS — I429 Cardiomyopathy, unspecified: Secondary | ICD-10-CM

## 2023-03-30 DIAGNOSIS — I43 Cardiomyopathy in diseases classified elsewhere: Secondary | ICD-10-CM | POA: Diagnosis not present

## 2023-03-30 DIAGNOSIS — I4891 Unspecified atrial fibrillation: Secondary | ICD-10-CM | POA: Diagnosis not present

## 2023-03-30 DIAGNOSIS — Z7901 Long term (current) use of anticoagulants: Secondary | ICD-10-CM | POA: Insufficient documentation

## 2023-03-30 DIAGNOSIS — N1832 Chronic kidney disease, stage 3b: Secondary | ICD-10-CM | POA: Insufficient documentation

## 2023-03-30 DIAGNOSIS — Z79899 Other long term (current) drug therapy: Secondary | ICD-10-CM | POA: Diagnosis not present

## 2023-03-30 DIAGNOSIS — I5041 Acute combined systolic (congestive) and diastolic (congestive) heart failure: Secondary | ICD-10-CM

## 2023-03-30 DIAGNOSIS — I5082 Biventricular heart failure: Secondary | ICD-10-CM | POA: Insufficient documentation

## 2023-03-30 DIAGNOSIS — I5022 Chronic systolic (congestive) heart failure: Secondary | ICD-10-CM | POA: Diagnosis present

## 2023-03-30 DIAGNOSIS — E854 Organ-limited amyloidosis: Secondary | ICD-10-CM

## 2023-03-30 MED ORDER — CARVEDILOL 3.125 MG PO TABS: 3.1250 mg | ORAL_TABLET | Freq: Two times a day (BID) | ORAL | 3 refills | Status: DC

## 2023-03-30 MED ORDER — TORSEMIDE 20 MG PO TABS: 20.0000 mg | ORAL_TABLET | Freq: Two times a day (BID) | ORAL | 3 refills | Status: DC

## 2023-03-30 NOTE — Patient Instructions (Signed)
 Great to see you today!!!  Medication Changes:  DECREASE Carvedilol to 3.125 mg Twice daily   INCREASE Torsemide to 20 mg Twice daily   Lab Work:  Your physician recommends that you return for lab work in: 2 weeks  Special Instructions // Education:  Do the following things EVERYDAY: Weigh yourself in the morning before breakfast. Write it down and keep it in a log. Take your medicines as prescribed Eat low salt foods--Limit salt (sodium) to 2000 mg per day.  Stay as active as you can everyday Limit all fluids for the day to less than 2 liters   Follow-Up in: 6 weeks and again in 3 months   At the [facility], you and your health needs are our priority. We have a designated team specialized in the treatment of Heart Failure. This Care Team includes your primary Heart Failure Specialized Cardiologist (physician), Advanced Practice Providers (APPs- Physician Assistants and Nurse Practitioners), and Pharmacist who all work together to provide you with the care you need, when you need it.   You may see any of the following providers on your designated Care Team at your next follow up:  Dr. Arvilla Meres Dr. Marca Ancona Dr. Dorthula Nettles Dr. Theresia Bough Tonye Becket, NP Robbie Lis, [place] [facility] Pillow, [place] Brynda Peon, NP [place] Lee, NP Karle Plumber, PharmD   Please be sure to bring in all your medications bottles to every appointment.   Need to Contact us:  If you have any questions or concerns before your next appointment please send [place] a message through [place] or call our office at [phone].    TO LEAVE A MESSAGE FOR THE NURSE SELECT OPTION 2, PLEASE LEAVE A MESSAGE INCLUDING: YOUR NAME DATE OF BIRTH CALL BACK NUMBER REASON FOR CALL**this is important as we prioritize the call backs  YOU WILL RECEIVE A CALL BACK THE SAME DAY AS LONG AS YOU CALL BEFORE 4:00 PM

## 2023-03-30 NOTE — Telephone Encounter (Signed)
 Pt called to request to speak with Dr Elwyn Lade regarding treatment options (no details provided)  Appt available 3/12 @ 11

## 2023-03-30 NOTE — Progress Notes (Signed)
 ADVANCED HEART FAILURE FOLLOW UP CLINIC NOTE  Referring Physician: [name], MD  Primary Care: [name], MD Primary Cardiologist:  HPI: Joel Duarte is a 78 y.o. 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      Echocardiogram obtained on 04/06/2021 showed EF 25 to 30%, speckled pattern suggestive of infiltrative cardiomyopathy, severe LVH, severely dilated left atrium, moderate MR, moderate TR, RVSP 72 mmHg. CAC sent which showed an elevated calcium score, stress testing did not show evidence of reversible ischemia. Given advanced CKD as well as lack of angina did not pursue further testing.    He underwent PYP scan that was markedly positive and was referred to HF clinic for further management. TTR genetic testing was also found to be positive and patient was started on tafamadis.      SUBJECTIVE:  Patient presents by himself to clinic today.  He reports that he is feeling fairly well, and has has seen a decrease in his weight since doubling up his torsemide.  He has not had to take the metolazone with the increased dosage.  He denies any symptoms of shortness of breath, chest pain, dizziness, syncope.  He reports that he has not been very active but would like to start walking more.  He has been tolerating the tafamidis well without issues.  We discussed his recent labs which included a severely elevated BNP.  His exam shows extensive volume overload but is actually improved from prior.  Here prefer to avoid hospitalization and we discussed the need for increased diuretics despite his known CKD.  Also discussed ICD given depressed EF despite medication. He would like to discuss with his wife and revisit at next appointment.   PMH, current medications, allergies, social history, and family history reviewed in epic.  PHYSICAL EXAM: Vitals:   03/30/23 1137  BP: 128/80  Pulse: 61  SpO2: 96%   GENERAL: Well nourished and in no apparent  distress at rest.  PULM:  Normal work of breathing, clear to auscultation bilaterally. Respirations are unlabored.  CARDIAC:  JVP: Elevated to the level of the earlobe with positive hepatojugular reflex         Irregular rate and rhythm. Systolic murmur.  Trace edema. Warm and well perfused extremities. ABDOMEN: Mildly distended NEUROLOGIC: Patient is oriented x3 with no focal or lateralizing neurologic deficits.    DATA REVIEW  ECG: 01/2023: LVH, afib with slow ventricular response     ECHO: 11/2022: Severely reduced LVEF 25-30%, severely reduced RV function, elevated PA pressures >70, no aortic valve stenosis, biatrial dilation   CATH: None   PYP: H/CL ratio 1.55, markedly positive grade III visually      Heart failure review: - Classification: Heart failure with reduced EF - Etiology: Cardiomyopathy due to cardiac amyloid - NYHA Class: II - Volume status: Hypervolemic - ACEi/ARB/ARNI: Not indicated - Aldosterone antagonist: Plan to start at a subsequent visit - Beta-blocker: Maximally tolerated dose - Digoxin: Intolerant - Hydralazine/Nitrates: Intolerant - SGLT2i: Maximally tolerated dose - GLP-1: Not a candidate - Advanced therapies: Not a candidate - ICD: Will discuss with his wife  ASSESSMENT & PLAN:  Chronic systolic heart failure secondary to cardiac amyloid: Patient with biventricular failure with diagnosis of cardiac amyloid. Genetics positive for ATTR, copy provided to patient and screening of children discussed.  Volume status somewhat improved on increased diuretic dosing, BNP severely elevated yesterday.  Discussed inpatient admission for IV diuresis but given stable NYHA  class II symptoms we will double oral diuretics, obtain labs and reassessment in 2 weeks, with APP clinic follow-up in 6 weeks. - Continue SGLT-2, would prioritize even given low GFR -Increase torsemide to 20 mg twice daily - Continue tafamadis - Continue prn metolazone - Continue  spironolactone 12.5mg  daily - Careful control of salt intake - Decrease carvedilol to 3.125mg  BID - ICD discussion, would like to talk with his wife - Low threshold for RHC   Atrial fibrillation: Would consider permanent given biatrial dilation, cardiac amyloid. Declined cardioversion in the past. Rate well controlled.  - Continue anticoagulation   CAC: Noted on CT scan, no anginal symptoms currently. Would only pursue cardiac cath if he were to develop symptoms. - Statin   CKD stage IIIB: Unfortuantely a poor prognostic sign, tolerating diuresis well currently. - Follows with nephrology  Follow up in 2-6 weeks, then me in three months  Clearnce Hasten, MD Advanced Heart Failure Mechanical Circulatory Support 03/30/23

## 2023-04-01 ENCOUNTER — Other Ambulatory Visit: Payer: Self-pay | Admitting: Urology

## 2023-04-01 DIAGNOSIS — N138 Other obstructive and reflux uropathy: Secondary | ICD-10-CM

## 2023-04-04 ENCOUNTER — Other Ambulatory Visit: Payer: Self-pay

## 2023-04-05 ENCOUNTER — Other Ambulatory Visit: Payer: Self-pay

## 2023-04-05 NOTE — Progress Notes (Signed)
 Specialty Pharmacy Refill Coordination Note  Aadam Zhen is a 78 y.o. male contacted today regarding refills of specialty medication(s) Tafamidis [facility])   Patient requested (Patient-Rptd) Delivery   Delivery date: (Patient-Rptd) 04/15/23   Verified address: (Patient-Rptd) [address] [place], [place] [address]   Medication will be filled on 04/14/23.

## 2023-04-07 ENCOUNTER — Other Ambulatory Visit: Payer: Self-pay | Admitting: Cardiology

## 2023-04-07 DIAGNOSIS — I5041 Acute combined systolic (congestive) and diastolic (congestive) heart failure: Secondary | ICD-10-CM

## 2023-04-07 DIAGNOSIS — I429 Cardiomyopathy, unspecified: Secondary | ICD-10-CM

## 2023-04-13 ENCOUNTER — Ambulatory Visit (HOSPITAL_COMMUNITY)
Admission: RE | Admit: 2023-04-13 | Discharge: 2023-04-13 | Disposition: A | Discharge status: Home / Self Care | Source: Ambulatory Visit | Attending: Cardiology | Admitting: Cardiology

## 2023-04-13 DIAGNOSIS — I5022 Chronic systolic (congestive) heart failure: Secondary | ICD-10-CM | POA: Diagnosis present

## 2023-04-13 LAB — BASIC METABOLIC PANEL
Anion gap: 10 (ref 5–15)
BUN: 48 mg/dL — ABNORMAL HIGH (ref 8–23)
CO2: 26 mmol/L (ref 22–32)
Calcium: 9.1 mg/dL (ref 8.9–10.3)
Chloride: 109 mmol/L (ref 98–111)
Creatinine, Ser: 2.44 mg/dL — ABNORMAL HIGH (ref 0.61–1.24)
GFR, Estimated: 27 mL/min — ABNORMAL LOW (ref >60)
Glucose, Bld: 112 mg/dL — ABNORMAL HIGH (ref 70–99)
Potassium: 3.6 mmol/L (ref 3.5–5.1)
Sodium: 145 mmol/L (ref 135–145)

## 2023-04-13 LAB — BRAIN NATRIURETIC PEPTIDE: B Natriuretic Peptide: 1392.1 pg/mL — ABNORMAL HIGH (ref 0.0–100.0)

## 2023-04-26 ENCOUNTER — Other Ambulatory Visit (HOSPITAL_COMMUNITY): Payer: Self-pay | Admitting: Cardiology

## 2023-04-26 ENCOUNTER — Encounter (HOSPITAL_COMMUNITY): Payer: Self-pay | Admitting: Cardiology

## 2023-05-03 ENCOUNTER — Encounter: Payer: Self-pay | Admitting: Pharmacy Technician

## 2023-05-03 ENCOUNTER — Telehealth: Payer: Self-pay | Admitting: Pharmacy Technician

## 2023-05-03 NOTE — Telephone Encounter (Signed)
I sent the pt a message.

## 2023-05-06 ENCOUNTER — Other Ambulatory Visit: Payer: Self-pay

## 2023-05-06 ENCOUNTER — Other Ambulatory Visit: Payer: Self-pay | Admitting: Cardiology

## 2023-05-06 DIAGNOSIS — I429 Cardiomyopathy, unspecified: Secondary | ICD-10-CM

## 2023-05-06 DIAGNOSIS — I5041 Acute combined systolic (congestive) and diastolic (congestive) heart failure: Secondary | ICD-10-CM

## 2023-05-06 NOTE — Progress Notes (Signed)
 Specialty Pharmacy Refill Coordination Note  Joel Duarte is a 78 y.o. male contacted today regarding refills of specialty medication(s) Tafamidis  (Vyndamax )   Patient requested (Patient-Rptd) Delivery   Delivery date: (Patient-Rptd) 05/13/23   Verified address: (Patient-Rptd) [address] [place], [place]. [address]   Medication will be filled on 04.24.25.

## 2023-05-10 NOTE — Progress Notes (Signed)
 ADVANCED HEART FAILURE FOLLOW UP CLINIC NOTE  Referring Physician: Ferrell Hu, MD  Primary Care: Ferrell Hu, MD Primary Cardiologist:  HPI: Joel Duarte is a 78 y.o. 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      Echocardiogram obtained on 04/06/2021 showed EF 25 to 30%, speckled pattern suggestive of infiltrative cardiomyopathy, severe LVH, severely dilated left atrium, moderate MR, moderate TR, RVSP 72 mmHg. CAC sent which showed an elevated calcium  score, stress testing did not show evidence of reversible ischemia. Given advanced CKD as well as lack of angina did not pursue further testing.    He underwent PYP scan that was markedly positive and was referred to HF clinic for further management. TTR genetic testing was also found to be positive and patient was started on tafamadis.      SUBJECTIVE:  Patient presents by himself to clinic today.  He reports that he is feeling fairly well, and has has seen a decrease in his weight since doubling up his torsemide .  He has not had to take the metolazone  with the increased dosage.  He denies any symptoms of shortness of breath, chest pain, dizziness, syncope.  He reports that he has not been very active but would like to start walking more.  He has been tolerating the tafamidis  well without issues.  We discussed his recent labs which included a severely elevated BNP.  His exam shows extensive volume overload but is actually improved from prior.  Here prefer to avoid hospitalization and we discussed the need for increased diuretics despite his known CKD.  Also discussed ICD given depressed EF despite medication. He would like to discuss with his wife and revisit at next appointment.   PMH, current medications, allergies, social history, and family history reviewed in epic.  PHYSICAL EXAM: There were no vitals filed for this visit.  GENERAL: Well nourished and in no apparent distress at rest.   PULM:  Normal work of breathing, clear to auscultation bilaterally. Respirations are unlabored.  CARDIAC:  JVP: Elevated to the level of the earlobe with positive hepatojugular reflex         Irregular rate and rhythm. Systolic murmur.  Trace edema. Warm and well perfused extremities. ABDOMEN: Mildly distended NEUROLOGIC: Patient is oriented x3 with no focal or lateralizing neurologic deficits.    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 Genetics positive for ATTR, copy provided to patient and screening of children discussed.  Volume status somewhat improved on increased diuretic dosing, BNP severely elevated yesterday.  Discussed inpatient admission for IV diuresis but given stable NYHA class II symptoms we will double oral  diuretics, obtain labs and reassessment in 2 weeks, with APP clinic follow-up in 6 weeks. - Continue SGLT-2, would prioritize even given low GFR -Increase torsemide  to 20 mg twice daily - Continue tafamadis - Continue prn metolazone  - Continue spironolactone  12.5mg   daily - Careful control of salt intake - Decrease carvedilol  to 3.125mg  BID - ICD discussion, would like to talk with his wife - Low threshold for RHC   Atrial fibrillation: Would consider permanent given biatrial dilation, cardiac amyloid. Declined cardioversion in the past. Rate well controlled.  - Continue anticoagulation   CAC: Noted on CT scan, no anginal symptoms currently. Would only pursue cardiac cath if he were to develop symptoms. - Statin   CKD stage IIIB: Unfortuantely a poor prognostic sign, tolerating diuresis well currently. - Follows with nephrology  Follow up in 2-6 weeks, then me in three months  Arta Lark, MD Advanced Heart Failure Mechanical Circulatory Support 05/10/23

## 2023-05-11 ENCOUNTER — Ambulatory Visit (HOSPITAL_COMMUNITY)
Admission: RE | Admit: 2023-05-11 | Discharge: 2023-05-11 | Disposition: A | Discharge status: Home / Self Care | Source: Ambulatory Visit | Attending: Family Medicine | Admitting: Family Medicine

## 2023-05-11 ENCOUNTER — Encounter (HOSPITAL_COMMUNITY): Payer: Self-pay

## 2023-05-11 VITALS — BP 142/66 | HR 67 | Wt 201.2 lb

## 2023-05-11 DIAGNOSIS — E854 Organ-limited amyloidosis: Secondary | ICD-10-CM | POA: Diagnosis not present

## 2023-05-11 DIAGNOSIS — Z79899 Other long term (current) drug therapy: Secondary | ICD-10-CM | POA: Diagnosis not present

## 2023-05-11 DIAGNOSIS — I429 Cardiomyopathy, unspecified: Secondary | ICD-10-CM

## 2023-05-11 DIAGNOSIS — N1832 Chronic kidney disease, stage 3b: Secondary | ICD-10-CM | POA: Insufficient documentation

## 2023-05-11 DIAGNOSIS — I5082 Biventricular heart failure: Secondary | ICD-10-CM | POA: Insufficient documentation

## 2023-05-11 DIAGNOSIS — I4821 Permanent atrial fibrillation: Secondary | ICD-10-CM

## 2023-05-11 DIAGNOSIS — I5022 Chronic systolic (congestive) heart failure: Secondary | ICD-10-CM | POA: Diagnosis not present

## 2023-05-11 DIAGNOSIS — I4891 Unspecified atrial fibrillation: Secondary | ICD-10-CM | POA: Diagnosis not present

## 2023-05-11 DIAGNOSIS — Z7901 Long term (current) use of anticoagulants: Secondary | ICD-10-CM | POA: Insufficient documentation

## 2023-05-11 DIAGNOSIS — I43 Cardiomyopathy in diseases classified elsewhere: Secondary | ICD-10-CM

## 2023-05-11 LAB — BASIC METABOLIC PANEL WITH GFR
Anion gap: 12 (ref 5–15)
BUN: 61 mg/dL — ABNORMAL HIGH (ref 8–23)
CO2: 24 mmol/L (ref 22–32)
Calcium: 9.1 mg/dL (ref 8.9–10.3)
Chloride: 105 mmol/L (ref 98–111)
Creatinine, Ser: 2.57 mg/dL — ABNORMAL HIGH (ref 0.61–1.24)
GFR, Estimated: 25 mL/min — ABNORMAL LOW (ref >60)
Glucose, Bld: 112 mg/dL — ABNORMAL HIGH (ref 70–99)
Potassium: 3.8 mmol/L (ref 3.5–5.1)
Sodium: 141 mmol/L (ref 135–145)

## 2023-05-11 LAB — BRAIN NATRIURETIC PEPTIDE: B Natriuretic Peptide: 1289.7 pg/mL — ABNORMAL HIGH (ref 0.0–100.0)

## 2023-05-11 MED ORDER — TORSEMIDE 20 MG PO TABS
60.0000 mg | ORAL_TABLET | Freq: Every day | ORAL | 3 refills | Status: DC
Start: 2023-05-11 — End: 2023-06-22

## 2023-05-11 NOTE — Patient Instructions (Addendum)
 Thank you for coming in today  If you had labs drawn today, any labs that are abnormal the clinic will call you No news is good news  Medications: Change Torsemide  to 60 mg daily 3 tablets  Follow up appointments:  Your physician recommends that you return for lab work in:  10-14 days BMET  Your physician recommends that you schedule a follow-up appointment in:  Keep June 2025 appointment with Dr. Alease Amend   Do the following things EVERYDAY: Weigh yourself in the morning before breakfast. Write it down and keep it in a log. Take your medicines as prescribed Eat low salt foods--Limit salt (sodium) to 2000 mg per day.  Stay as active as you can everyday Limit all fluids for the day to less than 2 liters   At the [facility], you and your health needs are our priority. As part of our continuing mission to provide you with exceptional heart care, we have created designated Provider Care Teams. These Care Teams include your primary Cardiologist (physician) and Advanced Practice Providers (APPs- Physician Assistants and Nurse Practitioners) who all work together to provide you with the care you need, when you need it.   You may see any of the following providers on your designated Care Team at your next follow up: Dr Jules Oar Dr Peder Bourdon Dr. Mimi Alt, NP Ruddy Corral, [place] [facility] [place], [place] Dennise Fitz, NP Luster Salters, PharmD   Please be sure to bring in all your medications bottles to every appointment.    Thank you for choosing [facility]  If you have any questions or concerns before your next appointment please send us  a message through [place] or call our office at [phone].    TO LEAVE A MESSAGE FOR THE NURSE SELECT OPTION 2, PLEASE LEAVE A MESSAGE INCLUDING: YOUR NAME DATE OF BIRTH CALL BACK NUMBER REASON FOR CALL**this is important as we prioritize the call  backs  YOU WILL RECEIVE A CALL BACK THE SAME DAY AS LONG AS YOU CALL BEFORE 4:00 PM

## 2023-05-11 NOTE — Progress Notes (Signed)
 ReDS Vest / Clip - 05/11/23 1300       ReDS Vest / Clip   Station Marker C    Ruler Value 30    ReDS Value Range Moderate volume overload    ReDS Actual Value 38

## 2023-05-12 ENCOUNTER — Telehealth (HOSPITAL_COMMUNITY): Payer: Self-pay | Admitting: Cardiology

## 2023-05-12 MED ORDER — POTASSIUM CHLORIDE CRYS ER 10 MEQ PO TBCR: 10.0000 meq | EXTENDED_RELEASE_TABLET | Freq: Every day | ORAL | 11 refills | Status: DC

## 2023-05-12 NOTE — Telephone Encounter (Signed)
-----   Message from Joel Duarte sent at 05/11/2023  4:53 PM EDT ----- Renal function a bit worse, BNP about the same.   Please start 10 KCL daily. He has repeat labs arranged

## 2023-05-12 NOTE — Telephone Encounter (Signed)
 Patient called.  Patient aware.

## 2023-05-16 ENCOUNTER — Encounter (HOSPITAL_COMMUNITY): Admitting: Cardiology

## 2023-05-24 ENCOUNTER — Ambulatory Visit (HOSPITAL_COMMUNITY)
Admission: RE | Admit: 2023-05-24 | Discharge: 2023-05-24 | Disposition: A | Discharge status: Home / Self Care | Source: Ambulatory Visit | Attending: Internal Medicine | Admitting: Internal Medicine

## 2023-05-24 DIAGNOSIS — I5022 Chronic systolic (congestive) heart failure: Secondary | ICD-10-CM | POA: Insufficient documentation

## 2023-05-24 LAB — BASIC METABOLIC PANEL WITH GFR
Anion gap: 13 (ref 5–15)
BUN: 73 mg/dL — ABNORMAL HIGH (ref 8–23)
CO2: 21 mmol/L — ABNORMAL LOW (ref 22–32)
Calcium: 9 mg/dL (ref 8.9–10.3)
Chloride: 108 mmol/L (ref 98–111)
Creatinine, Ser: 2.62 mg/dL — ABNORMAL HIGH (ref 0.61–1.24)
GFR, Estimated: 25 mL/min — ABNORMAL LOW (ref >60)
Glucose, Bld: 107 mg/dL — ABNORMAL HIGH (ref 70–99)
Potassium: 3.8 mmol/L (ref 3.5–5.1)
Sodium: 142 mmol/L (ref 135–145)

## 2023-05-25 ENCOUNTER — Other Ambulatory Visit: Payer: Medicare HMO

## 2023-05-25 DIAGNOSIS — C61 Malignant neoplasm of prostate: Secondary | ICD-10-CM

## 2023-05-26 ENCOUNTER — Other Ambulatory Visit (HOSPITAL_COMMUNITY): Payer: Self-pay | Admitting: Cardiology

## 2023-05-26 LAB — PSA: Prostate Specific Ag, Serum: 14 ng/mL — ABNORMAL HIGH (ref 0.0–4.0)

## 2023-06-01 ENCOUNTER — Ambulatory Visit: Payer: Medicare HMO | Admitting: Urology

## 2023-06-01 ENCOUNTER — Encounter: Payer: Self-pay | Admitting: Urology

## 2023-06-01 ENCOUNTER — Ambulatory Visit: Payer: Self-pay

## 2023-06-01 VITALS — BP 124/60 | HR 71

## 2023-06-01 DIAGNOSIS — N401 Enlarged prostate with lower urinary tract symptoms: Secondary | ICD-10-CM

## 2023-06-01 DIAGNOSIS — N138 Other obstructive and reflux uropathy: Secondary | ICD-10-CM

## 2023-06-01 DIAGNOSIS — R350 Frequency of micturition: Secondary | ICD-10-CM

## 2023-06-01 DIAGNOSIS — C61 Malignant neoplasm of prostate: Secondary | ICD-10-CM

## 2023-06-01 MED ORDER — DUTASTERIDE 0.5 MG PO CAPS: 0.5000 mg | ORAL_CAPSULE | Freq: Every day | ORAL | 3 refills | Status: DC

## 2023-06-01 NOTE — Patient Instructions (Signed)

## 2023-06-01 NOTE — Progress Notes (Signed)
 06/01/2023 3:36 PM   Joel Duarte May 03, 1945 [id number]  Referring provider: Ferrell Hu, MD [address] Joel Duarte [place],  [place] [address]  Followup prostate cancer   HPI: [name] is a 78yo here for followup for prostate cancer and BPH. PSA decreased to 14.0 from 14.8 on avodart . IPSS 3 QOL 2 on cardura  and avodart . Nocturia 2x. No straining to urinate.    PMH: Past Medical History:  Diagnosis Date   Allergy    Atrial fibrillation (HCC)    Enlarged prostate    Gout    Hyperlipidemia    Hypertension    Osteoarthrosis, unspecified whether generalized or localized, unspecified site    Seizures (HCC)    history of 1 a year ago    Surgical History: Past Surgical History:  Procedure Laterality Date   no surgical h/o  June 2014    Home Medications:  Allergies as of 06/01/2023   No Known Allergies      Medication List        Accurate as of Jun 01, 2023  3:36 PM. If you have any questions, ask your nurse or doctor.          allopurinol  100 MG tablet Commonly known as: ZYLOPRIM  Take 100 mg by mouth daily.   apixaban  5 MG Tabs tablet Commonly known as: Eliquis  Take 1 tablet (5 mg total) by mouth 2 (two) times daily.   atorvastatin  40 MG tablet Commonly known as: LIPITOR Take 1 tablet (40 mg total) by mouth at bedtime.   azelastine  0.1 % nasal spray Commonly known as: ASTELIN  Place 2 sprays into both nostrils as needed for rhinitis. Use in each nostril as directed   carvedilol  3.125 MG tablet Commonly known as: COREG  Take 1 tablet (3.125 mg total) by mouth 2 (two) times daily.   colchicine 0.6 MG tablet Take 0.6 mg by mouth 2 (two) times daily.   dapagliflozin  propanediol 10 MG Tabs tablet Commonly known as: Farxiga  Take 1 tablet (10 mg total) by mouth daily.   doxazosin  8 MG tablet Commonly known as: CARDURA  Take 1 tablet (8 mg total) by mouth in the morning AND 0.5 tablets (4 mg total) at bedtime.   dutasteride  0.5 MG  capsule Commonly known as: AVODART  TAKE 1 CAPSULE BY MOUTH EVERY DAY   FEBUXOSTAT PO Take 40 mg by mouth daily.   fluticasone  50 MCG/ACT nasal spray Commonly known as: FLONASE  Place 1 spray into both nostrils 2 (two) times daily as needed.   levETIRAcetam  750 MG tablet Commonly known as: KEPPRA  Take 1 tablet (750 mg total) by mouth 2 (two) times daily.   metolazone  2.5 MG tablet Commonly known as: ZAROXOLYN  Take 2.5 mg by mouth as needed.   potassium chloride  10 MEQ tablet Commonly known as: KLOR-CON  M Take 1 tablet (10 mEq total) by mouth daily.   spironolactone  25 MG tablet Commonly known as: ALDACTONE  Take 0.5 tablets (12.5 mg total) by mouth daily.   torsemide  20 MG tablet Commonly known as: DEMADEX  Take 3 tablets (60 mg total) by mouth daily.   Vyndamax  61 MG Caps Generic drug: Tafamidis  Take 1 capsule (61 mg total) by mouth daily.   zolpidem  5 MG tablet Commonly known as: AMBIEN  Take 5 mg by mouth at bedtime as needed.        Allergies: No Known Allergies  Family History: Family History  Problem Relation Age of Onset   Hypertension Mother    Heart Problems Mother    Healthy Sister  Healthy Child     Social History:  reports that he has never smoked. He has never used smokeless tobacco. He reports that he does not currently use alcohol. He reports that he does not use drugs.  ROS: All other review of systems were reviewed and are negative except what is noted above in HPI  Physical Exam: BP 124/60   Pulse 71   Constitutional:  Alert and oriented, No acute distress. HEENT: [place] AT, moist mucus membranes.  Trachea midline, no masses. Cardiovascular: No clubbing, cyanosis, or edema. Respiratory: Normal respiratory effort, no increased work of breathing. GI: Abdomen is soft, nontender, nondistended, no abdominal masses GU: No CVA tenderness.  Lymph: No cervical or inguinal lymphadenopathy. Skin: No rashes, bruises or suspicious  lesions. Neurologic: Grossly intact, no focal deficits, moving all 4 extremities. Psychiatric: Normal mood and affect.  Laboratory Data: Lab Results  Component Value Date   WBC 4.4 03/24/2023   HGB 12.5 (L) 03/24/2023   HCT 39.0 03/24/2023   MCV 86.7 03/24/2023   PLT 123 (L) 03/24/2023    Lab Results  Component Value Date   CREATININE 2.62 (H) 05/24/2023    No results found for: "PSA"  No results found for: "TESTOSTERONE"  Lab Results  Component Value Date   HGBA1C 6.0 11/30/2011    Urinalysis    Component Value Date/Time   COLORURINE YELLOW 03/24/2023 1505   APPEARANCEUR CLEAR 03/24/2023 1505   APPEARANCEUR Clear 09/29/2022 1340   LABSPEC 1.022 03/24/2023 1505   PHURINE 5.5 03/24/2023 1505   GLUCOSEU >1,000 (A) 03/24/2023 1505   HGBUR NEGATIVE 03/24/2023 1505   BILIRUBINUR NEGATIVE 03/24/2023 1505   BILIRUBINUR Negative 09/29/2022 1340   KETONESUR NEGATIVE 03/24/2023 1505   PROTEINUR 30 (A) 03/24/2023 1505   UROBILINOGEN 0.2 06/07/2011 0856   NITRITE NEGATIVE 03/24/2023 1505   LEUKOCYTESUR NEGATIVE 03/24/2023 1505    Lab Results  Component Value Date   LABMICR Comment 09/29/2022   WBCUA 0-5 03/29/2022   LABEPIT 0-10 03/29/2022   MUCUS Present 09/28/2021   BACTERIA NONE SEEN 03/24/2023    Pertinent Imaging:  No results found for this or any previous visit.  No results found for this or any previous visit.  No results found for this or any previous visit.  No results found for this or any previous visit.  Results for orders placed during the hospital encounter of 12/19/19  US  RENAL  Narrative CLINICAL DATA:  Progressive chronic renal failure, obstructive Rob a thick, hypertension  EXAM: RENAL / URINARY TRACT ULTRASOUND COMPLETE  COMPARISON:  None  FINDINGS: Right Kidney:  Renal measurements: 8.4 x 3.3 x 4.4 cm = volume: 69 mL. Cortical thinning. Probably slightly increased cortical echogenicity. No mass, hydronephrosis, or shadowing  calcification.  Left Kidney:  Renal measurements: 11.3 x 6.4 x 5.5 cm = volume: 2 8 mL. Cortical thinning. Increased cortical echogenicity. Cyst at mid upper pole 3.2 x 3.1 x 4.1 cm, simple features. No additional mass, hydronephrosis, or shadowing calcification.  Bladder:  Appears normal for partial degree of bladder distention.  Other:  Enlarged prostate gland 4.9 x 4.7 x 7.0 cm (volume = 84 cm^3) extending into base of urinary bladder  IMPRESSION: Cortical thinning and probable medical renal disease changes of both kidneys.  4.1 cm LEFT renal cyst.  No evidence of additional renal mass or hydronephrosis.  Prostatomegaly.   Electronically Signed By: Wynelle Heather M.D. On: 12/20/2019 10:53  No results found for this or any previous visit.  No results found  for this or any previous visit.  No results found for this or any previous visit.   Assessment & Plan:    1. Prostate cancer (HCC) (Primary) Followup 6 months PSA - Urinalysis, Routine w reflex microscopic  2. Benign prostatic hyperplasia with urinary obstruction Continue avodart   3. Urinary frequency Continue avodart    No follow-ups on file.  Joel Nailer, MD  [facility] [place]

## 2023-06-02 LAB — URINALYSIS, ROUTINE W REFLEX MICROSCOPIC
Bilirubin, UA: NEGATIVE
Ketones, UA: NEGATIVE
Nitrite, UA: NEGATIVE
Protein,UA: NEGATIVE
RBC, UA: NEGATIVE
Specific Gravity, UA: 1.01 (ref 1.005–1.030)
Urobilinogen, Ur: 0.2 mg/dL (ref 0.2–1.0)
pH, UA: 5.5 (ref 5.0–7.5)

## 2023-06-02 LAB — MICROSCOPIC EXAMINATION
Bacteria, UA: NONE SEEN
Epithelial Cells (non renal): NONE SEEN /HPF (ref 0–10)
RBC, Urine: NONE SEEN /HPF (ref 0–2)

## 2023-06-03 ENCOUNTER — Other Ambulatory Visit (HOSPITAL_COMMUNITY): Payer: Self-pay | Admitting: Cardiology

## 2023-06-03 ENCOUNTER — Other Ambulatory Visit: Payer: Self-pay

## 2023-06-03 NOTE — Progress Notes (Signed)
 Specialty Pharmacy Refill Coordination Note  Joel Duarte is a 78 y.o. male contacted today regarding refills of specialty medication(s) Tafamidis  (Vyndamax )   Patient requested Delivery   Delivery date: 06/10/23   Verified address: [address] [place], [place]. [address]   Medication will be filled on 06/09/23.

## 2023-06-09 ENCOUNTER — Other Ambulatory Visit: Payer: Self-pay

## 2023-06-22 ENCOUNTER — Encounter (HOSPITAL_COMMUNITY): Payer: Self-pay | Admitting: Cardiology

## 2023-06-22 ENCOUNTER — Ambulatory Visit (HOSPITAL_COMMUNITY): Payer: Self-pay | Admitting: Cardiology

## 2023-06-22 ENCOUNTER — Ambulatory Visit (HOSPITAL_COMMUNITY)
Admission: RE | Admit: 2023-06-22 | Discharge: 2023-06-22 | Disposition: A | Discharge status: Home / Self Care | Source: Ambulatory Visit | Attending: Cardiology | Admitting: Cardiology

## 2023-06-22 VITALS — BP 124/62 | HR 72 | Ht 74.0 in | Wt 194.2 lb

## 2023-06-22 DIAGNOSIS — I43 Cardiomyopathy in diseases classified elsewhere: Secondary | ICD-10-CM | POA: Diagnosis not present

## 2023-06-22 DIAGNOSIS — I4821 Permanent atrial fibrillation: Secondary | ICD-10-CM | POA: Diagnosis not present

## 2023-06-22 DIAGNOSIS — E854 Organ-limited amyloidosis: Secondary | ICD-10-CM | POA: Diagnosis present

## 2023-06-22 DIAGNOSIS — I429 Cardiomyopathy, unspecified: Secondary | ICD-10-CM

## 2023-06-22 DIAGNOSIS — N1832 Chronic kidney disease, stage 3b: Secondary | ICD-10-CM | POA: Diagnosis not present

## 2023-06-22 DIAGNOSIS — Z79899 Other long term (current) drug therapy: Secondary | ICD-10-CM | POA: Diagnosis not present

## 2023-06-22 DIAGNOSIS — I081 Rheumatic disorders of both mitral and tricuspid valves: Secondary | ICD-10-CM | POA: Diagnosis not present

## 2023-06-22 DIAGNOSIS — I5022 Chronic systolic (congestive) heart failure: Secondary | ICD-10-CM | POA: Insufficient documentation

## 2023-06-22 LAB — BASIC METABOLIC PANEL WITH GFR
Anion gap: 13 (ref 5–15)
BUN: 63 mg/dL — ABNORMAL HIGH (ref 8–23)
CO2: 22 mmol/L (ref 22–32)
Calcium: 8.9 mg/dL (ref 8.9–10.3)
Chloride: 107 mmol/L (ref 98–111)
Creatinine, Ser: 2.33 mg/dL — ABNORMAL HIGH (ref 0.61–1.24)
GFR, Estimated: 28 mL/min — ABNORMAL LOW (ref >60)
Glucose, Bld: 132 mg/dL — ABNORMAL HIGH (ref 70–99)
Potassium: 3.5 mmol/L (ref 3.5–5.1)
Sodium: 142 mmol/L (ref 135–145)

## 2023-06-22 MED ORDER — TORSEMIDE 20 MG PO TABS: 40.0000 mg | ORAL_TABLET | Freq: Every day | ORAL | Status: DC

## 2023-06-22 NOTE — Patient Instructions (Signed)
 CHANGE Torsemide  to 40 mg daily.  Labs done today, your results will be available in MyChart, we will contact you for abnormal readings.  Your physician recommends that you schedule a follow-up appointment in: 3 months ( September) ** PLEASE CALL THE OFFICE IN JULY TO ARRANGE YOUR FOLLOW UP APPOINTMENT.**  If you have any questions or concerns before your next appointment please send us  a message through [place] or call our office at [phone].    TO LEAVE A MESSAGE FOR THE NURSE SELECT OPTION 2, PLEASE LEAVE A MESSAGE INCLUDING: YOUR NAME DATE OF BIRTH CALL BACK NUMBER REASON FOR CALL**this is important as we prioritize the call backs  YOU WILL RECEIVE A CALL BACK THE SAME DAY AS LONG AS YOU CALL BEFORE 4:00 PM  At the [facility], you and your health needs are our priority. As part of our continuing mission to provide you with exceptional heart care, we have created designated Provider Care Teams. These Care Teams include your primary Cardiologist (physician) and Advanced Practice Providers (APPs- Physician Assistants and Nurse Practitioners) who all work together to provide you with the care you need, when you need it.   You may see any of the following providers on your designated Care Team at your next follow up: Dr Jules Oar Dr Peder Bourdon Dr. Alwin Baars Dr. Arta Lark Amy Marijane Shoulders, NP Ruddy Corral, [place] [facility] [place], [place] Dennise Fitz, NP [place] Lee, NP [place] Dellen, NP Luster Salters, PharmD Bevely Brush, PharmD   Please be sure to bring in all your medications bottles to every appointment.    Thank you for choosing [facility]

## 2023-06-23 NOTE — Progress Notes (Signed)
 ADVANCED HEART FAILURE FOLLOW UP CLINIC NOTE  Referring Physician: Ferrell Hu, MD  Primary Care: Ferrell Hu, MD Primary Cardiologist:  HPI: Joel Duarte is a 78 y.o. 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      Echocardiogram obtained on 04/06/2021 showed EF 25 to 30%, speckled pattern suggestive of infiltrative cardiomyopathy, severe LVH, severely dilated left atrium, moderate MR, moderate TR, RVSP 72 mmHg. CAC sent which showed an elevated calcium  score, stress testing did not show evidence of reversible ischemia. Given advanced CKD as well as lack of angina did not pursue further testing.    He underwent PYP scan that was markedly positive and was referred to HF clinic for further management. TTR genetic testing was also found to be positive and patient was started on tafamadis.      SUBJECTIVE:  Patient feeling much better, is down about 10-15 pounds since his last visit and is breathing and feeling much better. He has had no further issues with his medications and is overall doing fairly well. No complaints.    PMH, current medications, allergies, social history, and family history reviewed in epic.  PHYSICAL EXAM: Vitals:   06/22/23 1126  BP: 124/62  Pulse: 72   GENERAL: Well nourished and in no apparent distress at rest.  PULM:  Normal work of breathing, clear to auscultation bilaterally. Respirations are unlabored.  CARDIAC:  JVP: mildly elevated sitting up in chair, improved from prior         Irregular rate and rhythm, systolic murmur.  Trace edema. Warm and well perfused extremities. ABDOMEN: soft, nondistended NEUROLOGIC: Patient is oriented x3 with no focal or lateralizing neurologic deficits.    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 Genetics positive for ATTR, copy provided to patient and screening of children discussed. Significantly improved after increase in diuretics, creatinine improved with decongestion, though BUN remains elevated from baseline. Will decrease torsemide  to 40mg  daily - Continue SGLT-2, would prioritize even given low GFR -Reduce torsemide  to 40mg  daily  - Continue tafamadis, reviewed with pharmacy - Continue prn metolazone , has not needed - Continue spironolactone  12.5mg  daily - Continue carvedilol  3.125mg  BID - Holding off on ICD at this time   Atrial fibrillation: Would consider permanent given biatrial dilation, cardiac amyloid. Declined cardioversion in the past. Rate well controlled.  - Continue apixaban  5mg  BID   CAC: Noted on CT scan, no anginal symptoms currently. Would only pursue cardiac cath if he were to develop symptoms. - Continue atorvastatin  40mg  daily   CKD stage IIIB: Unfortuantely a poor prognostic sign, tolerating diuresis well currently. - Follows with nephrology  Follow up  in 3 months  Arta Lark, MD Advanced Heart Failure Mechanical Circulatory Support 06/23/23

## 2023-06-29 ENCOUNTER — Other Ambulatory Visit (HOSPITAL_COMMUNITY): Payer: Self-pay | Admitting: Cardiology

## 2023-07-04 ENCOUNTER — Encounter (INDEPENDENT_AMBULATORY_CARE_PROVIDER_SITE_OTHER): Payer: Self-pay

## 2023-07-04 ENCOUNTER — Other Ambulatory Visit: Payer: Self-pay

## 2023-07-05 ENCOUNTER — Other Ambulatory Visit: Payer: Self-pay

## 2023-07-05 ENCOUNTER — Other Ambulatory Visit (HOSPITAL_COMMUNITY): Payer: Self-pay

## 2023-07-05 NOTE — Progress Notes (Signed)
 Specialty Pharmacy Refill Coordination Note  MyChart Questionnaire Submission  Joel Duarte is a 78 y.o. male contacted today regarding refills of specialty medication(s) Vyndamax .  Patient requested: (Patient-Rptd) Delivery   Delivery date: 07/06/23  Verified address: (Patient-Rptd) [address]     [place], [place] [address]  Medication will be filled on 07/05/23.

## 2023-08-09 ENCOUNTER — Other Ambulatory Visit: Payer: Self-pay

## 2023-08-09 ENCOUNTER — Encounter (INDEPENDENT_AMBULATORY_CARE_PROVIDER_SITE_OTHER): Payer: Self-pay

## 2023-08-09 NOTE — Progress Notes (Signed)
 Specialty Pharmacy Refill Coordination Note  Mizraim Harmening is a 78 y.o. male contacted today regarding refills of specialty medication(s) Tafamidis  (Vyndamax )   Patient requested (Patient-Rptd) Delivery   Delivery date: 08/11/23   Verified address: (Patient-Rptd) [address]      [place].  [phone]   Medication will be filled on 07.23.25.

## 2023-08-29 ENCOUNTER — Encounter (HOSPITAL_COMMUNITY): Payer: Self-pay

## 2023-08-30 MED ORDER — BUMETANIDE 1 MG PO TABS: 1.0000 mg | ORAL_TABLET | Freq: Every day | ORAL | 11 refills | Status: DC

## 2023-09-02 ENCOUNTER — Other Ambulatory Visit: Payer: Self-pay

## 2023-09-05 ENCOUNTER — Other Ambulatory Visit: Payer: Self-pay

## 2023-09-16 ENCOUNTER — Other Ambulatory Visit: Payer: Self-pay

## 2023-09-16 ENCOUNTER — Encounter (INDEPENDENT_AMBULATORY_CARE_PROVIDER_SITE_OTHER): Payer: Self-pay

## 2023-09-16 NOTE — Progress Notes (Signed)
 Specialty Pharmacy Refill Coordination Note  Joel Duarte is a 78 y.o. male contacted today regarding refills of specialty medication(s) Tafamidis  (Vyndamax )   Patient requested (Patient-Rptd) Pickup at [facility] date: (Patient-Rptd) 09/17/23   Medication will be filled on 09/16/23.

## 2023-09-30 ENCOUNTER — Encounter: Payer: Self-pay | Admitting: Cardiology

## 2023-10-03 ENCOUNTER — Encounter (HOSPITAL_COMMUNITY): Payer: Self-pay | Admitting: Cardiology

## 2023-10-03 ENCOUNTER — Ambulatory Visit (HOSPITAL_COMMUNITY)
Admission: RE | Admit: 2023-10-03 | Discharge: 2023-10-03 | Disposition: A | Discharge status: Home / Self Care | Source: Ambulatory Visit | Attending: Cardiology | Admitting: Cardiology

## 2023-10-03 VITALS — BP 122/80 | HR 66 | Ht 74.0 in | Wt 215.0 lb

## 2023-10-03 DIAGNOSIS — I4821 Permanent atrial fibrillation: Secondary | ICD-10-CM | POA: Diagnosis not present

## 2023-10-03 DIAGNOSIS — M109 Gout, unspecified: Secondary | ICD-10-CM | POA: Diagnosis not present

## 2023-10-03 DIAGNOSIS — Z7901 Long term (current) use of anticoagulants: Secondary | ICD-10-CM | POA: Insufficient documentation

## 2023-10-03 DIAGNOSIS — I5082 Biventricular heart failure: Secondary | ICD-10-CM | POA: Diagnosis not present

## 2023-10-03 DIAGNOSIS — N1832 Chronic kidney disease, stage 3b: Secondary | ICD-10-CM | POA: Insufficient documentation

## 2023-10-03 DIAGNOSIS — Z79899 Other long term (current) drug therapy: Secondary | ICD-10-CM | POA: Insufficient documentation

## 2023-10-03 DIAGNOSIS — E854 Organ-limited amyloidosis: Secondary | ICD-10-CM

## 2023-10-03 DIAGNOSIS — I4891 Unspecified atrial fibrillation: Secondary | ICD-10-CM | POA: Insufficient documentation

## 2023-10-03 DIAGNOSIS — I5022 Chronic systolic (congestive) heart failure: Secondary | ICD-10-CM | POA: Insufficient documentation

## 2023-10-03 DIAGNOSIS — I43 Cardiomyopathy in diseases classified elsewhere: Secondary | ICD-10-CM

## 2023-10-03 MED ORDER — BUMETANIDE 1 MG PO TABS: 2.0000 mg | ORAL_TABLET | Freq: Every day | ORAL | 11 refills | Status: AC

## 2023-10-03 NOTE — Progress Notes (Signed)
 ADVANCED HEART FAILURE FOLLOW UP CLINIC NOTE  Referring Physician: Addie Camellia CROME, MD  Primary Care: Addie Camellia CROME, MD Primary Cardiologist:  HPI: Joel Duarte is a 78 y.o. 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      Echocardiogram obtained on 04/06/2021 showed EF 25 to 30%, speckled pattern suggestive of infiltrative cardiomyopathy, severe LVH, severely dilated left atrium, moderate MR, moderate TR, RVSP 72 mmHg. CAC sent which showed an elevated calcium  score, stress testing did not show evidence of reversible ischemia. Given advanced CKD as well as lack of angina did not pursue further testing.    He underwent PYP scan that was markedly positive and was referred to HF clinic for further management. TTR genetic testing was also found to be positive and patient was started on tafamadis.      SUBJECTIVE:  Patient feeling much better, is down about 10-15 pounds since his last visit and is breathing and feeling much better. He has had no further issues with his medications and is overall doing fairly well. No complaints.    PMH, current medications, allergies, social history, and family history reviewed in epic.  PHYSICAL EXAM: There were no vitals filed for this visit.  GENERAL: Well nourished and in no apparent distress at rest.  PULM:  Normal work of breathing, clear to auscultation bilaterally. Respirations are unlabored.  CARDIAC:  JVP: mildly elevated sitting up in chair, improved from prior         Irregular rate and rhythm, systolic murmur.  Trace edema. Warm and well perfused extremities. ABDOMEN: soft, nondistended NEUROLOGIC: Patient is oriented x3 with no focal or lateralizing neurologic deficits.    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 Genetics positive for ATTR, copy provided to patient and screening of children discussed. Significantly improved after increase in diuretics, creatinine improved with decongestion, though BUN remains elevated from baseline. Will decrease torsemide  to 40mg  daily - Continue SGLT-2, would prioritize even given low GFR -Reduce torsemide  to 40mg  daily  - Continue tafamadis, reviewed with pharmacy - Continue prn metolazone , has not needed - Continue spironolactone  12.5mg  daily - Continue carvedilol  3.125mg  BID - Holding off on ICD at this time   Atrial fibrillation: Would consider permanent given biatrial dilation, cardiac amyloid. Declined cardioversion in the past. Rate well controlled.  - Continue apixaban  5mg  BID   CAC: Noted on CT scan, no anginal symptoms currently. Would only pursue cardiac cath if he were to develop symptoms. - Continue atorvastatin  40mg  daily   CKD stage IIIB: Unfortuantely a poor prognostic sign, tolerating diuresis well currently. - Follows with nephrology  Follow up in 3 months  Morene Brownie, MD Advanced Heart Failure Mechanical Circulatory Support 10/03/23

## 2023-10-03 NOTE — Patient Instructions (Signed)
 STOP Carvedilol   INCREASE Bumex  to 2 mg daily.  Your physician has requested that you have an echocardiogram. Echocardiography is a painless test that uses sound waves to create images of your heart. It provides your doctor with information about the size and shape of your heart and how well your heart's chambers and valves are working. This procedure takes approximately one hour. There are no restrictions for this procedure. Please do NOT wear cologne, perfume, aftershave, or lotions (deodorant is allowed). Please arrive 15 minutes prior to your appointment time.  Please note: We ask at that you not bring children with you during ultrasound (echo/ vascular) testing. Due to room size and safety concerns, children are not allowed in the ultrasound rooms during exams. Our front office staff cannot provide observation of children in our lobby area while testing is being conducted. An adult accompanying a patient to their appointment will only be allowed in the ultrasound room at the discretion of the ultrasound technician under special circumstances. We apologize for any inconvenience.  Your physician recommends that you schedule a follow-up appointment in: 2 months   If you have any questions or concerns before your next appointment please send us  a message through [place] or call our office at [phone].    TO LEAVE A MESSAGE FOR THE NURSE SELECT OPTION 2, PLEASE LEAVE A MESSAGE INCLUDING: YOUR NAME DATE OF BIRTH CALL BACK NUMBER REASON FOR CALL**this is important as we prioritize the call backs  YOU WILL RECEIVE A CALL BACK THE SAME DAY AS LONG AS YOU CALL BEFORE 4:00 PM  At the [facility], you and your health needs are our priority. As part of our continuing mission to provide you with exceptional heart care, we have created designated Provider Care Teams. These Care Teams include your primary Cardiologist (physician) and Advanced Practice Providers (APPs- Physician  Assistants and Nurse Practitioners) who all work together to provide you with the care you need, when you need it.   You may see any of the following providers on your designated Care Team at your next follow up: Dr Toribio Fuel Dr Ezra Shuck Dr. Ria Commander Dr. Morene Brownie Amy Lenetta, NP Caffie Shed, [place] [facility] [place], [place] Beckey Coe, NP [place] Lee, NP Ellouise Class, NP Tinnie Redman, PharmD Jaun Bash, PharmD   Please be sure to bring in all your medications bottles to every appointment.    Thank you for choosing [facility]

## 2023-10-11 ENCOUNTER — Other Ambulatory Visit: Payer: Self-pay

## 2023-10-11 ENCOUNTER — Other Ambulatory Visit (HOSPITAL_COMMUNITY): Payer: Self-pay

## 2023-10-11 NOTE — Progress Notes (Signed)
 Specialty Pharmacy Refill Coordination Note  Joel Duarte is a 78 y.o. male contacted today regarding refills of specialty medication(s) Tafamidis  (Vyndamax )   Patient requested Delivery   Delivery date: 10/13/23   Verified address: [address]   [place] [address]   Medication will be filled on 10/12/23.

## 2023-11-04 ENCOUNTER — Other Ambulatory Visit: Payer: Self-pay

## 2023-11-05 ENCOUNTER — Encounter (INDEPENDENT_AMBULATORY_CARE_PROVIDER_SITE_OTHER): Payer: Self-pay

## 2023-11-05 ENCOUNTER — Other Ambulatory Visit: Payer: Self-pay | Admitting: Neurology

## 2023-11-05 DIAGNOSIS — G40109 Localization-related (focal) (partial) symptomatic epilepsy and epileptic syndromes with simple partial seizures, not intractable, without status epilepticus: Secondary | ICD-10-CM

## 2023-11-07 ENCOUNTER — Other Ambulatory Visit: Payer: Self-pay

## 2023-11-07 NOTE — Progress Notes (Signed)
 Specialty Pharmacy Refill Coordination Note  Joel Duarte is a 78 y.o. male contacted today regarding refills of specialty medication(s) Tafamidis  (Vyndamax )   Patient requested Delivery   Delivery date: 11/09/23   Verified address: [address] [place], [place]. [address]   Medication will be filled on 11/08/23.

## 2023-11-08 ENCOUNTER — Other Ambulatory Visit (HOSPITAL_COMMUNITY): Payer: Self-pay

## 2023-11-08 ENCOUNTER — Other Ambulatory Visit: Payer: Self-pay

## 2023-11-08 NOTE — Progress Notes (Signed)
 Specialty Pharmacy Ongoing Clinical Assessment Note  Joel Duarte is a 78 y.o. male who is being followed by the specialty pharmacy service for RxSp Cardiology   Patient's specialty medication(s) reviewed today: Tafamidis  (Vyndamax )   Missed doses in the last 4 weeks: 0   Patient/Caregiver did not have any additional questions or concerns.   Therapeutic benefit summary: Patient is achieving benefit   Adverse events/side effects summary: No adverse events/side effects   Patient's therapy is appropriate to: Continue    Goals Addressed             This Visit's Progress    Maintain optimal adherence to therapy   On track    Patient is on track. Patient will maintain adherence         Follow up: 12 months  [facility]

## 2023-11-11 NOTE — Progress Notes (Signed)
 Assessment/Plan:   1. 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  -Patient will continue his Keppra , 750 mg twice per day.  2.  Hypertension with renal insufficiency  -Discussed importance of controlling blood pressure.  -Okay to continue this dose of Keppra .  May need to renally dose in the future.  Discussed again today.  Current creatinine clearance is approximately 36, and overall Keppra  dose is fairly low.  3.  Hand paresthesias  -likely entrapment neuropathy, perhaps CTS  -discussed cock up wrist splints  -He was think about EMG, but does not want to schedule that right now.  Well and he changes his mind before next visit.  4.  Congestive heart failure with cardiomyopathy and A-fib  -Following with cardiology and now on apixaban   5.  F/u 1 year   Subjective:   Joel Duarte was seen today in follow up for seizure/frontal lobe epilepsy.  My previous records as well as any outside records available were reviewed prior to todays visit.  Pt with wife who supplements hx.  Patient has been faithful with his Keppra .  He has been seizure-free.  No new mood changes.  He did have cataract surgery this past year and his vision is much better.  He is walking with a cane now because of chronic knee issues.  PREVIOUS MEDICATIONS: Dilantin  (headache)  CURRENT MEDICATIONS:  Outpatient Encounter Medications as of 11/15/2023  Medication Sig   apixaban  (ELIQUIS ) 5 MG TABS tablet Take 1 tablet (5 mg total) by mouth 2 (two) times daily.   azelastine  (ASTELIN ) 0.1 % nasal spray Place 2 sprays into both nostrils as needed for rhinitis. Use in each nostril as directed   bumetanide  (BUMEX ) 1 MG tablet Take 2 tablets (2 mg total) by mouth daily.   dapagliflozin  propanediol (FARXIGA ) 10 MG TABS tablet Take 1 tablet (10 mg total) by mouth daily.   doxazosin  (CARDURA ) 8 MG tablet Take 1 tablet (8 mg total) by mouth in the morning AND 0.5 tablets (4 mg total) at bedtime.   dutasteride   (AVODART ) 0.5 MG capsule Take 1 capsule (0.5 mg total) by mouth daily.   fluticasone  (FLONASE ) 50 MCG/ACT nasal spray Place 1 spray into both nostrils 2 (two) times daily as needed.   levETIRAcetam  (KEPPRA ) 750 MG tablet TAKE 1 TABLET BY MOUTH 2 TIMES DAILY.   potassium chloride  SA (KLOR-CON  M) 10 MEQ tablet Take 1 tablet (10 mEq total) by mouth daily.   spironolactone  (ALDACTONE ) 25 MG tablet Take 0.5 tablets (12.5 mg total) by mouth daily.   Tafamidis  (VYNDAMAX ) 61 MG CAPS Take 1 capsule (61 mg total) by mouth daily.   zolpidem  (AMBIEN ) 5 MG tablet Take 5 mg by mouth at bedtime as needed.   allopurinol  (ZYLOPRIM ) 100 MG tablet Take 100 mg by mouth daily.   atorvastatin  (LIPITOR) 40 MG tablet Take 1 tablet (40 mg total) by mouth at bedtime. (Patient not taking: Reported on 10/03/2023)   FEBUXOSTAT PO Take 40 mg by mouth daily. (Patient not taking: Reported on 10/03/2023)   metolazone  (ZAROXOLYN ) 2.5 MG tablet Take 2.5 mg by mouth as needed. (Patient not taking: Reported on 10/03/2023)   [DISCONTINUED] benazepril  (LOTENSIN ) 40 MG tablet TAKE 1 TABLET BY MOUTH DAILY   [DISCONTINUED] NIFEdipine  (PROCARDIA ) 10 MG capsule Take 1 capsule (10 mg total) by mouth 2 (two) times daily.   No facility-administered encounter medications on file as of 11/15/2023.     Objective:   PHYSICAL EXAMINATION:    VITALS:   Vitals:  11/15/23 1510  BP: 122/64  Pulse: (!) 52  SpO2: 97%  Weight: 207 lb 9.6 oz (94.2 kg)  Height: 6' 2 (1.88 m)    GEN:  The patient appears stated age and is in NAD. HEENT:  Normocephalic, atraumatic.  The mucous membranes are moist. The superficial temporal arteries are without ropiness or tenderness. CV: Irregular.  Bradycardic Lungs:  CTAB Neck/HEME:  There are no carotid bruits bilaterally. Extremities: There is lower extremity edema, left greater than right.  Neurological examination:  Orientation: The patient is alert and oriented x3. Cranial nerves: There is good  facial symmetry.extraocular muscles are intact, without nystagmus.  The speech is fluent and clear. Soft palate rises symmetrically and there is no tongue deviation. Hearing is intact to conversational tone. Sensation: Sensation is intact to light touch throughout Motor: Strength is 5/5 in the bilateral upper and lower extremities.  There is no pronator drift.  Movement examination: Tone: There is normal tone in the UE/LE Abnormal movements:  no tremor.  No myoclonus.  No asterixis.   Coordination:  There is no decremation with RAM's. Gait and Station: The patient has no difficulty arising out of a deep-seated chair without the use of the hands. The patient's stride length is good but antalgic.  He has a cane     Cc:  Addie Camellia CROME, MD

## 2023-11-15 ENCOUNTER — Encounter: Payer: Self-pay | Admitting: Neurology

## 2023-11-15 ENCOUNTER — Ambulatory Visit: Payer: Medicare HMO | Admitting: Neurology

## 2023-11-15 VITALS — BP 122/64 | HR 52 | Ht 74.0 in | Wt 207.6 lb

## 2023-11-15 DIAGNOSIS — G40109 Localization-related (focal) (partial) symptomatic epilepsy and epileptic syndromes with simple partial seizures, not intractable, without status epilepticus: Secondary | ICD-10-CM

## 2023-11-15 DIAGNOSIS — N289 Disorder of kidney and ureter, unspecified: Secondary | ICD-10-CM

## 2023-11-15 DIAGNOSIS — I5022 Chronic systolic (congestive) heart failure: Secondary | ICD-10-CM

## 2023-11-15 DIAGNOSIS — N1832 Chronic kidney disease, stage 3b: Secondary | ICD-10-CM

## 2023-11-15 NOTE — Patient Instructions (Signed)

## 2023-11-19 ENCOUNTER — Other Ambulatory Visit: Payer: Self-pay | Admitting: Cardiology

## 2023-11-19 DIAGNOSIS — I4891 Unspecified atrial fibrillation: Secondary | ICD-10-CM

## 2023-11-21 NOTE — Telephone Encounter (Signed)
 Pt last saw Dr Zenaida 10/03/23, last labs 06/22/23 Creat 2.33, age 78, weight 94.2kg, based on specified criteria pt is on appropriate dosage of Eliquis  5mg  BID for afib.  Will refill rx.

## 2023-11-24 ENCOUNTER — Other Ambulatory Visit

## 2023-11-24 DIAGNOSIS — C61 Malignant neoplasm of prostate: Secondary | ICD-10-CM

## 2023-11-25 LAB — PSA: Prostate Specific Ag, Serum: 13.5 ng/mL — ABNORMAL HIGH (ref 0.0–4.0)

## 2023-11-29 ENCOUNTER — Ambulatory Visit: Payer: Self-pay | Admitting: Urology

## 2023-11-30 ENCOUNTER — Other Ambulatory Visit: Payer: Self-pay

## 2023-12-02 ENCOUNTER — Ambulatory Visit: Admitting: Urology

## 2023-12-02 VITALS — BP 146/79 | HR 76

## 2023-12-02 DIAGNOSIS — C61 Malignant neoplasm of prostate: Secondary | ICD-10-CM

## 2023-12-02 DIAGNOSIS — R350 Frequency of micturition: Secondary | ICD-10-CM

## 2023-12-02 DIAGNOSIS — R351 Nocturia: Secondary | ICD-10-CM

## 2023-12-02 DIAGNOSIS — N138 Other obstructive and reflux uropathy: Secondary | ICD-10-CM

## 2023-12-02 DIAGNOSIS — N401 Enlarged prostate with lower urinary tract symptoms: Secondary | ICD-10-CM | POA: Diagnosis not present

## 2023-12-02 LAB — URINALYSIS, ROUTINE W REFLEX MICROSCOPIC
Bilirubin, UA: NEGATIVE
Ketones, UA: NEGATIVE
Leukocytes,UA: NEGATIVE
Nitrite, UA: NEGATIVE
Protein,UA: NEGATIVE
RBC, UA: NEGATIVE
Specific Gravity, UA: 1.015 (ref 1.005–1.030)
Urobilinogen, Ur: 0.2 mg/dL (ref 0.2–1.0)
pH, UA: 5.5 (ref 5.0–7.5)

## 2023-12-02 MED ORDER — ALFUZOSIN HCL ER 10 MG PO TB24: 10.0000 mg | ORAL_TABLET | Freq: Every day | ORAL | 11 refills | Status: AC

## 2023-12-02 MED ORDER — DUTASTERIDE 0.5 MG PO CAPS: 0.5000 mg | ORAL_CAPSULE | Freq: Every day | ORAL | 3 refills | Status: AC

## 2023-12-02 NOTE — Progress Notes (Signed)
 12/02/2023 1:17 PM   Joel Duarte [birth year] [id number]  Referring provider: Addie Camellia CROME, MD [address] [place],  [place] [address]  Followup prostate cancer and BPH   HPI: [name] is a 78yo here for followup for prostate cancer and BPH. IPSS 3 QOL 1 on cardura  and avodart . PSA 13.5 which has decreased from 14.0. He has dizziness with cardura . Urine stream strong. No straining to urinate. No other complaints today   PMH: Past Medical History:  Diagnosis Date   Allergy    Atrial fibrillation (HCC)    Enlarged prostate    Gout    Hyperlipidemia    Hypertension    Osteoarthrosis, unspecified whether generalized or localized, unspecified site    Seizures (HCC)    history of 1 a year ago    Surgical History: Past Surgical History:  Procedure Laterality Date   CATARACT EXTRACTION Bilateral 2025   no surgical h/o  06/18/2012    Home Medications:  Allergies as of 12/02/2023   No Known Allergies      Medication List        Accurate as of December 02, 2023  1:17 PM. If you have any questions, ask your nurse or doctor.          allopurinol  100 MG tablet Commonly known as: ZYLOPRIM  Take 100 mg by mouth daily.   atorvastatin  40 MG tablet Commonly known as: LIPITOR Take 1 tablet (40 mg total) by mouth at bedtime.   azelastine  0.1 % nasal spray Commonly known as: ASTELIN  Place 2 sprays into both nostrils as needed for rhinitis. Use in each nostril as directed   bumetanide  1 MG tablet Commonly known as: BUMEX  Take 2 tablets (2 mg total) by mouth daily.   dapagliflozin  propanediol 10 MG Tabs tablet Commonly known as: Farxiga  Take 1 tablet (10 mg total) by mouth daily.   doxazosin  8 MG tablet Commonly known as: CARDURA  Take 1 tablet (8 mg total) by mouth in the morning AND 0.5 tablets (4 mg total) at bedtime.   dutasteride  0.5 MG capsule Commonly known as: AVODART  Take 1 capsule (0.5 mg total) by mouth daily.   Eliquis  5 MG Tabs  tablet Generic drug: apixaban  TAKE 1 TABLET BY MOUTH TWICE A DAY   FEBUXOSTAT PO Take 40 mg by mouth daily.   fluticasone  50 MCG/ACT nasal spray Commonly known as: FLONASE  Place 1 spray into both nostrils 2 (two) times daily as needed.   levETIRAcetam  750 MG tablet Commonly known as: KEPPRA  TAKE 1 TABLET BY MOUTH 2 TIMES DAILY.   metolazone  2.5 MG tablet Commonly known as: ZAROXOLYN  Take 2.5 mg by mouth as needed.   potassium chloride  10 MEQ tablet Commonly known as: KLOR-CON  M Take 1 tablet (10 mEq total) by mouth daily.   spironolactone  25 MG tablet Commonly known as: ALDACTONE  Take 0.5 tablets (12.5 mg total) by mouth daily.   Vyndamax  61 MG Caps Generic drug: Tafamidis  Take 1 capsule (61 mg total) by mouth daily.   zolpidem  5 MG tablet Commonly known as: AMBIEN  Take 5 mg by mouth at bedtime as needed.        Allergies: No Known Allergies  Family History: Family History  Problem Relation Age of Onset   Hypertension Mother    Heart Problems Mother    Healthy Sister    Healthy Child     Social History:  reports that he has never smoked. He has never used smokeless tobacco. He reports current alcohol use.  He reports that he does not use drugs.  ROS: All other review of systems were reviewed and are negative except what is noted above in HPI  Physical Exam: BP (!) 146/79   Pulse 76   Constitutional:  Alert and oriented, No acute distress. HEENT: [place] AT, moist mucus membranes.  Trachea midline, no masses. Cardiovascular: No clubbing, cyanosis, or edema. Respiratory: Normal respiratory effort, no increased work of breathing. GI: Abdomen is soft, nontender, nondistended, no abdominal masses GU: No CVA tenderness.  Lymph: No cervical or inguinal lymphadenopathy. Skin: No rashes, bruises or suspicious lesions. Neurologic: Grossly intact, no focal deficits, moving all 4 extremities. Psychiatric: Normal mood and affect.  Laboratory Data: Lab Results   Component Value Date   WBC 4.4 03/24/2023   HGB 12.5 (L) 03/24/2023   HCT 39.0 03/24/2023   MCV 86.7 03/24/2023   PLT 123 (L) 03/24/2023    Lab Results  Component Value Date   CREATININE 2.33 (H) 06/22/2023    No results found for: PSA  No results found for: TESTOSTERONE  Lab Results  Component Value Date   HGBA1C 6.0 11/30/2011    Urinalysis    Component Value Date/Time   COLORURINE YELLOW 03/24/2023 1505   APPEARANCEUR Clear 06/01/2023 1513   LABSPEC 1.022 03/24/2023 1505   PHURINE 5.5 03/24/2023 1505   GLUCOSEU 3+ (A) 06/01/2023 1513   HGBUR NEGATIVE 03/24/2023 1505   BILIRUBINUR Negative 06/01/2023 1513   KETONESUR NEGATIVE 03/24/2023 1505   PROTEINUR Negative 06/01/2023 1513   PROTEINUR 30 (A) 03/24/2023 1505   UROBILINOGEN 0.2 06/07/2011 0856   NITRITE Negative 06/01/2023 1513   NITRITE NEGATIVE 03/24/2023 1505   LEUKOCYTESUR Trace (A) 06/01/2023 1513   LEUKOCYTESUR NEGATIVE 03/24/2023 1505    Lab Results  Component Value Date   LABMICR See below: 06/01/2023   WBCUA 0-5 06/01/2023   LABEPIT None seen 06/01/2023   MUCUS Present 09/28/2021   BACTERIA None seen 06/01/2023    Pertinent Imaging:  No results found for this or any previous visit.  No results found for this or any previous visit.  No results found for this or any previous visit.  No results found for this or any previous visit.  Results for orders placed during the hospital encounter of 12/19/19  US  RENAL  Narrative CLINICAL DATA:  Progressive chronic renal failure, obstructive Rob a thick, hypertension  EXAM: RENAL / URINARY TRACT ULTRASOUND COMPLETE  COMPARISON:  None  FINDINGS: Right Kidney:  Renal measurements: 8.4 x 3.3 x 4.4 cm = volume: 69 mL. Cortical thinning. Probably slightly increased cortical echogenicity. No mass, hydronephrosis, or shadowing calcification.  Left Kidney:  Renal measurements: 11.3 x 6.4 x 5.5 cm = volume: 2 8 mL. Cortical thinning.  Increased cortical echogenicity. Cyst at mid upper pole 3.2 x 3.1 x 4.1 cm, simple features. No additional mass, hydronephrosis, or shadowing calcification.  Bladder:  Appears normal for partial degree of bladder distention.  Other:  Enlarged prostate gland 4.9 x 4.7 x 7.0 cm (volume = 84 cm^3) extending into base of urinary bladder  IMPRESSION: Cortical thinning and probable medical renal disease changes of both kidneys.  4.1 cm LEFT renal cyst.  No evidence of additional renal mass or hydronephrosis.  Prostatomegaly.   Electronically Signed By: Oneil Kiss M.D. On: 12/20/2019 10:53  No results found for this or any previous visit.  No results found for this or any previous visit.  No results found for this or any previous visit.   Assessment &  Plan:    1. Prostate cancer (HCC) (Primary) Followup 6 months with PSA - Urinalysis, Routine w reflex microscopic  2. Benign prostatic hyperplasia with urinary obstruction Uroxatral  and dutasteride   3. Urinary frequency and nocturia Switch cardura  to uroxatral     No follow-ups on file.  Belvie Clara, MD  [facility]

## 2023-12-03 NOTE — Progress Notes (Signed)
 ADVANCED HEART FAILURE FOLLOW UP CLINIC NOTE  Referring Physician: Addie Camellia CROME, MD  Primary Care: Addie Camellia CROME, MD Primary Cardiologist:  HPI: Joel Duarte is a 78 y.o. 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      Echocardiogram obtained on 04/06/2021 showed EF 25 to 30%, speckled pattern suggestive of infiltrative cardiomyopathy, severe LVH, severely dilated left atrium, moderate MR, moderate TR, RVSP 72 mmHg. CAC sent which showed an elevated calcium  score, stress testing did not show evidence of reversible ischemia. Given advanced CKD as well as lack of angina did not pursue further testing.    He underwent PYP scan that was markedly positive and was referred to HF clinic for further management. TTR genetic testing was also found to be positive and patient was started on tafamadis.      SUBJECTIVE:  Overall doing fair, reports that he has had worsening weight gain and swelling after transitioning to the Bumex  1 mg daily.  However, his gout has improved.  He has some baseline shortness of breath with more than moderate exertion but this has not significantly changed since his last visit. He had has some numbness in his tongue recently, but no swelling or shortness of breath. Reports some unsteadiness moving around after his morning meds.   PMH, current medications, allergies, social history, and family history reviewed in epic.  PHYSICAL EXAM: There were no vitals filed for this visit.   GENERAL: NAD, well appearing PULM:  Normal work of breathing, CTAB CARDIAC:  JVP: mildly elevated         Normal rate with regular rhythm. No murmurs, rubs or gallops.  2+ LE edema. Warm and well perfused extremities. ABDOMEN: Soft, non-tender, non-distended. NEUROLOGIC: Patient is oriented x3 with no focal or lateralizing neurologic deficits.     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 Genetics positive for ATTR, copy provided to patient and screening of children discussed. Mildly volume up today after transition to bumex  from torsemide  given gout flares. - Continue SGLT-2, would prioritize even given low GFR - Increase bumex  to 2mg  daily - Continue tafamadis, doing well on therapy - Continue spironolactone  12.5mg  daily - Stop coreg  given amyloid and symptoms after AM medications - Continue prn metolazone , has not needed - Continue spironolactone  12.5mg  daily - Continue carvedilol  3.125mg  BID - Holding off on ICD at this time after risk benefit discussion - Repeat echo at next visit  Atrial fibrillation: Would consider permanent given biatrial dilation, cardiac amyloid. Declined cardioversion in the past. Rate well controlled.  - Continue apixaban  5mg  BID   CAC: Noted on CT scan, no anginal symptoms currently. Would  only pursue cardiac cath if he were to develop symptoms. - Continue atorvastatin  40mg  daily   CKD stage IIIB: Relatively stable at this time. - Continue SGLT-2 - Follows with nephrology  Follow up in 2 months with  echo  Morene Brownie, MD Advanced Heart Failure Mechanical Circulatory Support 12/03/23

## 2023-12-05 ENCOUNTER — Ambulatory Visit (HOSPITAL_BASED_OUTPATIENT_CLINIC_OR_DEPARTMENT_OTHER)
Admission: RE | Admit: 2023-12-05 | Discharge: 2023-12-05 | Disposition: A | Discharge status: Home / Self Care | Source: Ambulatory Visit | Attending: Cardiology | Admitting: Cardiology

## 2023-12-05 ENCOUNTER — Encounter (HOSPITAL_COMMUNITY): Payer: Self-pay | Admitting: Cardiology

## 2023-12-05 ENCOUNTER — Ambulatory Visit (HOSPITAL_COMMUNITY)
Admission: RE | Admit: 2023-12-05 | Discharge: 2023-12-05 | Disposition: A | Discharge status: Home / Self Care | Source: Ambulatory Visit | Attending: Cardiology | Admitting: Cardiology

## 2023-12-05 VITALS — BP 138/80 | HR 71 | Wt 221.6 lb

## 2023-12-05 DIAGNOSIS — I43 Cardiomyopathy in diseases classified elsewhere: Secondary | ICD-10-CM | POA: Diagnosis not present

## 2023-12-05 DIAGNOSIS — I5082 Biventricular heart failure: Secondary | ICD-10-CM | POA: Insufficient documentation

## 2023-12-05 DIAGNOSIS — I5022 Chronic systolic (congestive) heart failure: Secondary | ICD-10-CM | POA: Diagnosis not present

## 2023-12-05 DIAGNOSIS — I129 Hypertensive chronic kidney disease with stage 1 through stage 4 chronic kidney disease, or unspecified chronic kidney disease: Secondary | ICD-10-CM | POA: Insufficient documentation

## 2023-12-05 DIAGNOSIS — I4891 Unspecified atrial fibrillation: Secondary | ICD-10-CM | POA: Insufficient documentation

## 2023-12-05 DIAGNOSIS — Z7901 Long term (current) use of anticoagulants: Secondary | ICD-10-CM | POA: Insufficient documentation

## 2023-12-05 DIAGNOSIS — N1832 Chronic kidney disease, stage 3b: Secondary | ICD-10-CM | POA: Insufficient documentation

## 2023-12-05 DIAGNOSIS — I2510 Atherosclerotic heart disease of native coronary artery without angina pectoris: Secondary | ICD-10-CM | POA: Insufficient documentation

## 2023-12-05 DIAGNOSIS — M109 Gout, unspecified: Secondary | ICD-10-CM | POA: Diagnosis not present

## 2023-12-05 DIAGNOSIS — G4733 Obstructive sleep apnea (adult) (pediatric): Secondary | ICD-10-CM | POA: Diagnosis not present

## 2023-12-05 DIAGNOSIS — E854 Organ-limited amyloidosis: Secondary | ICD-10-CM | POA: Insufficient documentation

## 2023-12-05 DIAGNOSIS — Z79899 Other long term (current) drug therapy: Secondary | ICD-10-CM | POA: Diagnosis not present

## 2023-12-05 LAB — ECHOCARDIOGRAM COMPLETE
AR max vel: 1.97 cm2
AV Area VTI: 1.71 cm2
AV Area mean vel: 1.74 cm2
AV Mean grad: 4 mmHg
AV Peak grad: 6.5 mmHg
Ao pk vel: 1.27 m/s
Area-P 1/2: 5.7 cm2
Calc EF: 41.3 %
S' Lateral: 4 cm
Single Plane A2C EF: 41.5 %
Single Plane A4C EF: 41.4 %

## 2023-12-05 LAB — BASIC METABOLIC PANEL WITH GFR
Anion gap: 10 (ref 5–15)
BUN: 58 mg/dL — ABNORMAL HIGH (ref 8–23)
CO2: 20 mmol/L — ABNORMAL LOW (ref 22–32)
Calcium: 8.7 mg/dL — ABNORMAL LOW (ref 8.9–10.3)
Chloride: 115 mmol/L — ABNORMAL HIGH (ref 98–111)
Creatinine, Ser: 2.7 mg/dL — ABNORMAL HIGH (ref 0.61–1.24)
GFR, Estimated: 24 mL/min — ABNORMAL LOW (ref >60)
Glucose, Bld: 110 mg/dL — ABNORMAL HIGH (ref 70–99)
Potassium: 4.2 mmol/L (ref 3.5–5.1)
Sodium: 145 mmol/L (ref 135–145)

## 2023-12-05 NOTE — Patient Instructions (Signed)
 There has been no changes to your medications.  Labs done today, your results will be available in MyChart, we will contact you for abnormal readings.  Your physician recommends that you schedule a follow-up appointment in: 4 months ( March 2026) ** PLEASE CALL THE OFFICE IN [place] TO ARRANGE YOUR FOLLOW UP APPOINTMENT.**  If you have any questions or concerns before your next appointment please send us  a message through [facility] or call our office at [phone].    TO LEAVE A MESSAGE FOR THE NURSE SELECT OPTION 2, PLEASE LEAVE A MESSAGE INCLUDING: YOUR NAME DATE OF BIRTH CALL BACK NUMBER REASON FOR CALL**this is important as we prioritize the call backs  YOU WILL RECEIVE A CALL BACK THE SAME DAY AS LONG AS YOU CALL BEFORE 4:00 PM  At the [facility], you and your health needs are our priority. As part of our continuing mission to provide you with exceptional heart care, we have created designated Provider Care Teams. These Care Teams include your primary Cardiologist (physician) and Advanced Practice Providers (APPs- Physician Assistants and Nurse Practitioners) who all work together to provide you with the care you need, when you need it.   You may see any of the following providers on your designated Care Team at your next follow up: Dr Toribio Fuel Dr Ezra Shuck Dr. Morene Brownie Greig Mosses, NP Caffie Shed, [place] [facility] [place], [place] Beckey Coe, NP Jordan Lee, NP Ellouise Class, NP Tinnie Redman, PharmD Jaun Bash, PharmD   Please be sure to bring in all your medications bottles to every appointment.    Thank you for choosing [facility]

## 2023-12-08 ENCOUNTER — Other Ambulatory Visit: Payer: Self-pay

## 2023-12-10 ENCOUNTER — Encounter: Payer: Self-pay | Admitting: Urology

## 2023-12-10 NOTE — Patient Instructions (Signed)

## 2023-12-12 ENCOUNTER — Other Ambulatory Visit: Payer: Self-pay

## 2023-12-12 NOTE — Progress Notes (Signed)
 Specialty Pharmacy Refill Coordination Note  Clair Alfieri is a 78 y.o. male contacted today regarding refills of specialty medication(s) Tafamidis  (Vyndamax )   Patient requested Delivery   Delivery date: 12/14/23   Verified address: [address]  [place], [place] [address]   Medication will be filled on: 12/13/23

## 2023-12-18 ENCOUNTER — Other Ambulatory Visit: Payer: Self-pay | Admitting: Physician Assistant

## 2023-12-22 NOTE — Telephone Encounter (Signed)
 This is a CHF pt

## 2024-01-03 ENCOUNTER — Other Ambulatory Visit: Payer: Self-pay

## 2024-01-05 ENCOUNTER — Other Ambulatory Visit: Payer: Self-pay

## 2024-01-05 NOTE — Progress Notes (Signed)
 Specialty Pharmacy Refill Coordination Note  Joel Duarte is a 78 y.o. male contacted today regarding refills of specialty medication(s) Tafamidis  (Vyndamax )   Patient requested (Patient-Rptd) Delivery   Delivery date: 01/09/24   Verified address: (Patient-Rptd) [address]  [place]. [address]   Medication will be filled on: 01/06/24

## 2024-01-06 ENCOUNTER — Other Ambulatory Visit: Payer: Self-pay

## 2024-01-27 ENCOUNTER — Other Ambulatory Visit (HOSPITAL_COMMUNITY): Payer: Self-pay | Admitting: Cardiology

## 2024-01-27 ENCOUNTER — Other Ambulatory Visit (HOSPITAL_COMMUNITY): Payer: Self-pay

## 2024-01-27 ENCOUNTER — Other Ambulatory Visit: Payer: Self-pay

## 2024-01-27 MED ORDER — VYNDAMAX 61 MG PO CAPS
61.0000 mg | ORAL_CAPSULE | Freq: Every day | ORAL | 11 refills | Status: AC
  Filled 2024-01-27 – 2024-02-01 (×3): qty 30, 30d supply, fill #0

## 2024-01-31 ENCOUNTER — Other Ambulatory Visit: Payer: Self-pay

## 2024-01-31 ENCOUNTER — Other Ambulatory Visit (HOSPITAL_COMMUNITY): Payer: Self-pay

## 2024-01-31 ENCOUNTER — Telehealth (HOSPITAL_COMMUNITY): Payer: Self-pay

## 2024-01-31 NOTE — Telephone Encounter (Signed)
 [facility] Patient Advocate Encounter  The patient was approved for a [employer] grant that will help cover the cost of Eliquis , Farxiga , Spironolactone , Vyndamax .  Total amount awarded, $7,500.  Effective: 01/09/2024 - 01/07/2025.  BIN [id number] PCN [id number] [id number] ID [id number]  Approval and processing information added to GAILA Rachel DEL, CPhT Rx Patient Advocate Phone: [phone]

## 2024-02-01 ENCOUNTER — Other Ambulatory Visit (HOSPITAL_COMMUNITY): Payer: Self-pay

## 2024-02-01 ENCOUNTER — Other Ambulatory Visit (HOSPITAL_COMMUNITY): Payer: Self-pay | Admitting: Family Medicine

## 2024-02-01 ENCOUNTER — Other Ambulatory Visit: Payer: Self-pay | Admitting: Cardiology

## 2024-02-01 ENCOUNTER — Other Ambulatory Visit: Payer: Self-pay

## 2024-02-02 ENCOUNTER — Other Ambulatory Visit (HOSPITAL_COMMUNITY): Payer: Self-pay

## 2024-02-02 NOTE — Progress Notes (Signed)
 Specialty Pharmacy Refill Coordination Note  MyChart Questionnaire Submission  Joel Duarte is a 79 y.o. male contacted today regarding refills of specialty medication(s) Vyndamax .  Doses on hand: (Patient-Rptd) 16   Patient requested: (Patient-Rptd) Delivery   Delivery date: 02/14/24  Verified address: [address] [place] [address]  Medication will be filled on 02/13/24

## 2024-02-04 ENCOUNTER — Other Ambulatory Visit: Payer: Self-pay | Admitting: Neurology

## 2024-02-04 DIAGNOSIS — G40109 Localization-related (focal) (partial) symptomatic epilepsy and epileptic syndromes with simple partial seizures, not intractable, without status epilepticus: Secondary | ICD-10-CM

## 2024-02-07 ENCOUNTER — Other Ambulatory Visit (HOSPITAL_COMMUNITY): Payer: Self-pay

## 2024-02-07 ENCOUNTER — Other Ambulatory Visit: Payer: Self-pay

## 2024-02-08 ENCOUNTER — Other Ambulatory Visit: Payer: Self-pay

## 2024-02-08 NOTE — Progress Notes (Signed)
 Shipment rescheduled for 1/22 delivery 1/23 d/t weather, patient aware.

## 2024-02-09 ENCOUNTER — Other Ambulatory Visit: Payer: Self-pay

## 2024-05-29 ENCOUNTER — Other Ambulatory Visit

## 2024-06-06 ENCOUNTER — Ambulatory Visit: Admitting: Urology

## 2024-11-15 ENCOUNTER — Ambulatory Visit: Admitting: Neurology
